# Patient Record
Sex: Male | Born: 1940
Health system: Southern US, Community
[De-identification: ages and names within clinical notes are randomized; demographics above are authoritative.]

## PROBLEM LIST (undated history)

## (undated) DIAGNOSIS — K219 Gastro-esophageal reflux disease without esophagitis: Secondary | ICD-10-CM

## (undated) DIAGNOSIS — Z8719 Personal history of other diseases of the digestive system: Secondary | ICD-10-CM

## (undated) DIAGNOSIS — Z87442 Personal history of urinary calculi: Secondary | ICD-10-CM

## (undated) DIAGNOSIS — N189 Chronic kidney disease, unspecified: Secondary | ICD-10-CM

## (undated) DIAGNOSIS — Z973 Presence of spectacles and contact lenses: Secondary | ICD-10-CM

## (undated) DIAGNOSIS — M1990 Unspecified osteoarthritis, unspecified site: Secondary | ICD-10-CM

## (undated) DIAGNOSIS — R079 Chest pain, unspecified: Secondary | ICD-10-CM

## (undated) DIAGNOSIS — I10 Essential (primary) hypertension: Secondary | ICD-10-CM

## (undated) DIAGNOSIS — C801 Malignant (primary) neoplasm, unspecified: Secondary | ICD-10-CM

## (undated) DIAGNOSIS — J189 Pneumonia, unspecified organism: Secondary | ICD-10-CM

## (undated) HISTORY — PX: COLONOSCOPY: SHX174

## (undated) HISTORY — PX: KNEE ARTHROSCOPY: SHX127

## (undated) HISTORY — DX: Chest pain, unspecified: R07.9

---

## 1974-10-03 HISTORY — PX: APPENDECTOMY: SHX54

## 1974-10-03 HISTORY — PX: CHOLECYSTECTOMY: SHX55

## 1982-10-03 HISTORY — PX: BACK SURGERY: SHX140

## 2000-05-28 ENCOUNTER — Emergency Department (HOSPITAL_COMMUNITY): Admission: EM | Admit: 2000-05-28 | Discharge: 2000-05-28 | Payer: Self-pay | Admitting: Emergency Medicine

## 2000-05-28 ENCOUNTER — Encounter: Payer: Self-pay | Admitting: Emergency Medicine

## 2005-08-01 ENCOUNTER — Encounter: Payer: Self-pay | Admitting: Gastroenterology

## 2005-08-01 DIAGNOSIS — D126 Benign neoplasm of colon, unspecified: Secondary | ICD-10-CM | POA: Insufficient documentation

## 2005-08-01 DIAGNOSIS — K648 Other hemorrhoids: Secondary | ICD-10-CM | POA: Insufficient documentation

## 2007-02-12 ENCOUNTER — Inpatient Hospital Stay (HOSPITAL_COMMUNITY): Admission: RE | Admit: 2007-02-12 | Discharge: 2007-02-21 | Payer: Self-pay | Admitting: Orthopedic Surgery

## 2007-02-22 ENCOUNTER — Ambulatory Visit: Payer: Self-pay | Admitting: Internal Medicine

## 2007-03-21 ENCOUNTER — Ambulatory Visit: Payer: Self-pay | Admitting: Gastroenterology

## 2007-03-21 ENCOUNTER — Ambulatory Visit (HOSPITAL_COMMUNITY): Admission: RE | Admit: 2007-03-21 | Discharge: 2007-03-21 | Payer: Self-pay | Admitting: Gastroenterology

## 2007-04-11 ENCOUNTER — Ambulatory Visit: Payer: Self-pay | Admitting: Gastroenterology

## 2008-01-19 DIAGNOSIS — I10 Essential (primary) hypertension: Secondary | ICD-10-CM

## 2008-01-19 DIAGNOSIS — M129 Arthropathy, unspecified: Secondary | ICD-10-CM | POA: Insufficient documentation

## 2008-01-19 DIAGNOSIS — K560 Paralytic ileus: Secondary | ICD-10-CM

## 2008-01-19 DIAGNOSIS — K3189 Other diseases of stomach and duodenum: Secondary | ICD-10-CM

## 2008-01-19 DIAGNOSIS — R1013 Epigastric pain: Secondary | ICD-10-CM

## 2008-01-19 DIAGNOSIS — N200 Calculus of kidney: Secondary | ICD-10-CM

## 2009-08-07 ENCOUNTER — Emergency Department (HOSPITAL_COMMUNITY): Admission: EM | Admit: 2009-08-07 | Discharge: 2009-08-07 | Payer: Self-pay | Admitting: Family Medicine

## 2010-09-08 ENCOUNTER — Ambulatory Visit (HOSPITAL_COMMUNITY)
Admission: RE | Admit: 2010-09-08 | Discharge: 2010-09-08 | Payer: Self-pay | Source: Home / Self Care | Admitting: Orthopedic Surgery

## 2010-11-04 ENCOUNTER — Institutional Professional Consult (permissible substitution) (INDEPENDENT_AMBULATORY_CARE_PROVIDER_SITE_OTHER): Payer: Medicare Other | Admitting: Cardiovascular Disease

## 2010-11-04 DIAGNOSIS — K219 Gastro-esophageal reflux disease without esophagitis: Secondary | ICD-10-CM

## 2010-11-04 DIAGNOSIS — I119 Hypertensive heart disease without heart failure: Secondary | ICD-10-CM

## 2010-11-04 DIAGNOSIS — Z01810 Encounter for preprocedural cardiovascular examination: Secondary | ICD-10-CM

## 2010-12-22 ENCOUNTER — Other Ambulatory Visit: Payer: Self-pay | Admitting: Orthopedic Surgery

## 2010-12-22 ENCOUNTER — Other Ambulatory Visit (HOSPITAL_COMMUNITY): Payer: Self-pay | Admitting: Orthopedic Surgery

## 2010-12-22 ENCOUNTER — Encounter (HOSPITAL_COMMUNITY): Payer: Medicare Other

## 2010-12-22 ENCOUNTER — Ambulatory Visit (HOSPITAL_COMMUNITY)
Admission: RE | Admit: 2010-12-22 | Discharge: 2010-12-22 | Disposition: A | Discharge status: Home / Self Care | Payer: Medicare Other | Source: Ambulatory Visit | Attending: Orthopedic Surgery | Admitting: Orthopedic Surgery

## 2010-12-22 DIAGNOSIS — Z01818 Encounter for other preprocedural examination: Secondary | ICD-10-CM | POA: Insufficient documentation

## 2010-12-22 DIAGNOSIS — I10 Essential (primary) hypertension: Secondary | ICD-10-CM | POA: Insufficient documentation

## 2010-12-22 DIAGNOSIS — Z01812 Encounter for preprocedural laboratory examination: Secondary | ICD-10-CM | POA: Insufficient documentation

## 2010-12-22 LAB — CBC
MCH: 28.7 pg (ref 26.0–34.0)
MCHC: 33.6 g/dL (ref 30.0–36.0)
MCV: 85.4 fL (ref 78.0–100.0)
Platelets: 223 10*3/uL (ref 150–400)
RBC: 5.29 MIL/uL (ref 4.22–5.81)
RDW: 13.4 % (ref 11.5–15.5)

## 2010-12-22 LAB — COMPREHENSIVE METABOLIC PANEL
Alkaline Phosphatase: 70 U/L (ref 39–117)
BUN: 19 mg/dL (ref 6–23)
Creatinine, Ser: 1.07 mg/dL (ref 0.4–1.5)
Glucose, Bld: 89 mg/dL (ref 70–99)
Potassium: 4.6 mEq/L (ref 3.5–5.1)
Total Bilirubin: 0.9 mg/dL (ref 0.3–1.2)
Total Protein: 7.6 g/dL (ref 6.0–8.3)

## 2010-12-22 LAB — URINALYSIS, ROUTINE W REFLEX MICROSCOPIC
Bilirubin Urine: NEGATIVE
Ketones, ur: NEGATIVE mg/dL
Nitrite: NEGATIVE
Protein, ur: NEGATIVE mg/dL
pH: 6 (ref 5.0–8.0)

## 2010-12-22 LAB — APTT: aPTT: 32 seconds (ref 24–37)

## 2010-12-22 LAB — PROTIME-INR
INR: 0.97 (ref 0.00–1.49)
Prothrombin Time: 13.1 seconds (ref 11.6–15.2)

## 2010-12-29 ENCOUNTER — Inpatient Hospital Stay (HOSPITAL_COMMUNITY)
Admission: RE | Admit: 2010-12-29 | Discharge: 2011-01-01 | DRG: 468 | Disposition: A | Discharge status: Home Health | Payer: Medicare Other | Source: Ambulatory Visit | Attending: Orthopedic Surgery | Admitting: Orthopedic Surgery

## 2010-12-29 DIAGNOSIS — Z7982 Long term (current) use of aspirin: Secondary | ICD-10-CM

## 2010-12-29 DIAGNOSIS — Z791 Long term (current) use of non-steroidal anti-inflammatories (NSAID): Secondary | ICD-10-CM

## 2010-12-29 DIAGNOSIS — Z01818 Encounter for other preprocedural examination: Secondary | ICD-10-CM

## 2010-12-29 DIAGNOSIS — T84039A Mechanical loosening of unspecified internal prosthetic joint, initial encounter: Principal | ICD-10-CM | POA: Diagnosis present

## 2010-12-29 DIAGNOSIS — K219 Gastro-esophageal reflux disease without esophagitis: Secondary | ICD-10-CM | POA: Diagnosis present

## 2010-12-29 DIAGNOSIS — Z01812 Encounter for preprocedural laboratory examination: Secondary | ICD-10-CM

## 2010-12-29 DIAGNOSIS — Z79899 Other long term (current) drug therapy: Secondary | ICD-10-CM

## 2010-12-29 DIAGNOSIS — Y831 Surgical operation with implant of artificial internal device as the cause of abnormal reaction of the patient, or of later complication, without mention of misadventure at the time of the procedure: Secondary | ICD-10-CM | POA: Diagnosis present

## 2010-12-29 DIAGNOSIS — I10 Essential (primary) hypertension: Secondary | ICD-10-CM | POA: Diagnosis present

## 2010-12-29 DIAGNOSIS — Z96659 Presence of unspecified artificial knee joint: Secondary | ICD-10-CM

## 2010-12-29 LAB — GRAM STAIN: Gram Stain: NONE SEEN

## 2010-12-29 LAB — ABO/RH: ABO/RH(D): A POS

## 2010-12-30 LAB — BASIC METABOLIC PANEL
Calcium: 8.4 mg/dL (ref 8.4–10.5)
GFR calc Af Amer: >60 mL/min (ref >60)
GFR calc non Af Amer: >60 mL/min (ref >60)
Sodium: 135 mEq/L (ref 135–145)

## 2010-12-30 LAB — CBC
MCHC: 33.3 g/dL (ref 30.0–36.0)
Platelets: 194 10*3/uL (ref 150–400)
RDW: 13 % (ref 11.5–15.5)
WBC: 8.4 10*3/uL (ref 4.0–10.5)

## 2010-12-31 LAB — BASIC METABOLIC PANEL
CO2: 29 mEq/L (ref 19–32)
Calcium: 8.6 mg/dL (ref 8.4–10.5)
Chloride: 101 mEq/L (ref 96–112)
Creatinine, Ser: 0.94 mg/dL (ref 0.4–1.5)
GFR calc Af Amer: >60 mL/min (ref >60)
Sodium: 135 mEq/L (ref 135–145)

## 2010-12-31 LAB — CBC
Hemoglobin: 11.2 g/dL — ABNORMAL LOW (ref 13.0–17.0)
MCH: 28.5 pg (ref 26.0–34.0)
Platelets: 168 10*3/uL (ref 150–400)
RBC: 3.93 MIL/uL — ABNORMAL LOW (ref 4.22–5.81)

## 2011-01-01 LAB — CBC
Hemoglobin: 11.2 g/dL — ABNORMAL LOW (ref 13.0–17.0)
MCH: 28.5 pg (ref 26.0–34.0)
MCV: 85.2 fL (ref 78.0–100.0)
Platelets: 185 10*3/uL (ref 150–400)
RBC: 3.93 MIL/uL — ABNORMAL LOW (ref 4.22–5.81)
WBC: 10.3 10*3/uL (ref 4.0–10.5)

## 2011-01-01 LAB — BODY FLUID CULTURE: Culture: NO GROWTH

## 2011-01-02 HISTORY — PX: JOINT REPLACEMENT: SHX530

## 2011-01-03 LAB — ANAEROBIC CULTURE

## 2011-01-04 NOTE — Op Note (Signed)
NAME:  Frank Johnston, Frank Johnston            ACCOUNT NO.:  [id number]  MEDICAL RECORD NO.:  [id number]           PATIENT TYPE:  I  LOCATION:  0006                         FACILITY:  [facility]  PHYSICIAN:  Ollen Gross, M.D.    DATE OF BIRTH:  10-Dec-1940  DATE OF PROCEDURE: DATE OF DISCHARGE:                              OPERATIVE REPORT   PREOPERATIVE DIAGNOSIS:  Failed left total knee arthroplasty.  POSTOPERATIVE DIAGNOSIS:  Failed left total knee arthroplasty.  PROCEDURE:  Left total knee arthroplasty revision.  SURGEON:  Ollen Gross, MD  ASSISTANT:  Alexzandrew L. Perkins, PAC  ANESTHESIA:  General.  ESTIMATED BLOOD LOSS:  Minimal.  DRAINS:  Hemovac x1.  TOURNIQUET TIME:  Up 41 minutes at 300 mmHg, down 8 minutes, and then up additional 22 minutes at 300 mmHg.  COMPLICATIONS:  None.  CONDITION:  Stable to Recovery.  BRIEF CLINICAL NOTE:  Frank Johnston is a 70 year old male, had a left total knee arthroplasty done approximately 4 years ago and has had persistent pain and stiffness in the knee.  Workup for infection was negative.  He has range of motion approximately 10 to 70 or 75.  Bone scan showed loosening of the components.  He presents now for scar excision, left total knee arthroplasty revision.  PROCEDURE IN DETAIL:  After successful administration of general anesthetic, a tourniquet is placed on his left thigh and his left lower extremity was prepped and draped in the usual sterile fashion. Extremities wrapped in Esmarch, tourniquet inflated to 300 mmHg. Midline incision was made with 10 blade through the subcutaneous tissue to the level of the extensor mechanism.  A fresh blade is used to make a medial parapatellar arthrotomy.  There was minimal fluid encountered in the joint.  That which is encountered is sent for stat Gram stain and C and S which came back no organisms and no white cells.  The soft tissue on the proximal medial tibia is then subperiosteally  elevated around the joint line to the semimembranosus bursa.  The scar is then excised from under the extensor mechanism, both medial and lateral.  Was able to evert the patella and then flex to 90 degrees.  I removed the tibial polyethylene.  The tissue looked normal with no signs of infection. There is hypertrophic scar present which we did debride.  I removed the femoral component by disrupting interface between the component and bone utilizing osteotomes.  There is minimal bone loss with the removal.  We then subluxed tibia forward, placed retractors, and then disrupted interface between tibial component bone.  Of note, there was a tremendous amount of hypertrophic bone in the lateral gutter.  This was coming off the tibia and intertwined with the infrapatellar fat pad.  This was definitely limiting some of the motion. When I excised this, a really freed everything and is better.  I was then able to easily remove the tibial component which had loosened.  We then removed the cement.  The canals were then entered with the canal finder and I thoroughly irrigated to remove the fatty contents.  I reamed on the tibial side up to 13 mm and  on the femoral side up to 20 mm.  The extramedullary tibial cutting guide is placed referencing proximally at the medial aspect of tibial tubercle and distally along the second metatarsal axis tibial crest.  Block is pinned to remove about 2 mm off the cut bone surface.  This is performed with an oscillating saw.  The size 4 is the most appropriate tibial component.  I prepared the proximal tibia with a modular drill and then the keel punch for the size 4.  We had excellent fit with that.  I also prepared for the sleeve and placed a 29 sleeve which had excellent torsional stability.  We then placed a 20 mm reamer into the femoral canal and used that as our cutting guide.  The 5 degrees left valgus alignment guide is placed. We removed minimal bone off  the femoral condyles distally.  I chose to remove more bone and used 4-mm augments distally both medial and lateral.  The size 4 is the most appropriate on the femoral side.  The AP cutting block was placed in the +2 position to effectively raise the stem and lower the flange of prosthesis down to the femur.  The rotation is marked by creating rectangular flexion gap at 90 degrees using the spacer block.  The anterior-posterior cuts are made.  A 54-mm augments both medial and lateral posterior.  This block was removed and the intercondylar blocks placed, and chamfer cuts in the intercondylar crest of TC3.  The cuts were made.  A trial femur was then configured which was a size 4 TC3 with a 20 x 75 stem in a +2 position and the 4 mm distal augments medial and lateral and 4 mm augments posteriorly medial and lateral.  We had great fit with this trial.  On the tibial side, we placed a trial then which was a 4 MBT with a 13 x 30 stem extension and 29 sleeve.  Sleeve was placed in the neutral position.  Also had excellent fit with this prosthesis.  I placed a 12.5 mm insert.  Full extension was achieved with excellent varus-valgus and anterior- posterior balance throughout full range of motion.  The patella was inspected.  I did a patelloplasty removing the soft tissue from the articular surface.  There is a lot of hypertrophic bone overgrowing the patellar component.  I removed that.  There was no overhanging bone that was done.  The patella tracks normally through a range of motion.  We then held in extension and with tourniquet down for initial tourniquet time of 41 minutes.  It was held down for 8 minutes while the components were assembled on the back table.  Components assembled of the same sizes with the trials were utilized.  After 8 minutes, the components were all configured, then the leg was rewrapped in Esmarch and tourniquet reinflated to 300 mmHg.  Wounds copiously irrigated  with saline solution and then the cement restrictor trial for the tibia is placed and it is a size 5.  The real restrictor was then placed in the appropriate depth in the tibial canal.  We then mixed the cement which is 3 batches of gentamicin impregnated cement.  Once ready for implantation, it is injected in the tibial canal and on the tibial cut bone surface and tibial components impacted and all extruded cement removed.  On the femoral side, we cemented distally, that has a Product/process development scientist.  The femoral component then impacted and extruded cement removed.  A  12.5 mm insert trial was placed and full extension was achieved with excellent varus-valgus, anterior-posterior balance throughout full range of motion.  When the cement is fully hardened, then a permanent 12.5 mm TC3 rotating platform insert is placed into the tibial tray.  The wounds were copiously irrigated with saline solution and the arthrotomy closed over Hemovac drain with interrupted #1 PDS. Flexion against gravity to 125 degrees, which was marked improvement from preop.  Tourniquet released, second tourniquet time of 22 minutes.  Subcu was closed with interrupted 2-0 Vicryl and skin closed with staples.  Catheter for Marcaine pain pump was placed and pumps initiated.  Incisions cleaned and dried and a bulky sterile dressing applied.  We then placed into a knee immobilizer, awakened and transported to Recovery in stable condition.     Ollen Gross, M.D.     FA/MEDQ  D:  12/29/2010  T:  12/29/2010  Job:  [id number]  Electronically Signed by Ollen Gross M.D. on 01/04/2011 07:14:19 AM

## 2011-01-04 NOTE — H&P (Signed)
NAME:  Frank Johnston, SOULES NO.:  [id number]  MEDICAL RECORD NO.:  [id number]           PATIENT TYPE:  LOCATION:                                 FACILITY:  PHYSICIAN:  Ollen Gross, M.D.    DATE OF BIRTH:  [birth year]  DATE OF ADMISSION:  12/29/2010 DATE OF DISCHARGE:                             HISTORY & PHYSICAL   CHIEF COMPLAINT:  Left knee pain.  HISTORY OF PRESENT ILLNESS:  The patient is a 70 year old male who has been seen by Dr. Lequita Halt for ongoing left knee pain and stiffness.  He was seen as a second opinion this past fall for ongoing problems with his knee.  He has been treated by Dr. Gean Birchwood in the past and has had a history of progressive issues with knee.  He originally had a total knee arthroplasty performed in May 2008 and did not have any initial problems.  Postoperatively, a couple of weeks after surgery, he developed a stitch abscess, it was opened up and swabbed.  He was on antibiotics for 7-10 days and felt things were healing back up.  He did fairly well until about a year and a half ago when he started to lose motion and had increasing pain.  He was told that he may have to have the part of the knee or the whole knee redone.  He was seen by Dr. Lequita Halt as a second opinion.  He was sent for a bone scan, which had been reviewed and found to be "hot" in all 3 phases and around all 3 components.  There is a high probability including differential is a loose prosthesis and from all of these, stiffening has developed.  It is felt he would best be served by undergoing revision versus resection due to the issues with his total knee.  Risks and benefits have been discussed with the patient and he elected to proceed with surgery.  He has been seen preoperatively by Dr. Jarome Matin, felt to be stable for surgery.  He has also been seen by Dr. Kristeen Miss and felt to be at low risk for his upcoming knee procedure.  ALLERGIES:  No known drug  allergies.  CURRENT MEDICATIONS: 1. Diovan HCT 320/12.5 mg daily. 2. Verapamil SR 240 mg daily. 3. Omeprazole 40 mg daily. 4. Metoclopramide 5 mg daily. 5. Aspirin 81 mg daily. 6. Vitamin E 400 international units soft gel daily. 7. Ultracet 37.5/325 mg 3 times a day as needed. 8. Fluticasone nasal spray 2 sprays each nostril daily as needed. 9. Multivitamin.  PAST MEDICAL HISTORY: 1. Tinnitus. 2. Early cataracts. 3. Hypertension. 4. Gastroesophageal reflux disease. 5. Hiatal hernia. 6. Hemorrhoids. 7. History of postoperative ileus, following his previous total knee     replacement. 8. History of renal calculi x1. 9. Childhood illnesses of measles and mumps.  PAST SURGICAL HISTORY:  Gallbladder surgery in 1976, back surgery in 1984, right knee arthroscopy in 2007, left knee arthroscopy in 2008, left total knee replacement in May 2008.  FAMILY HISTORY:  Father deceased at age 37 with lung cancer.  Mother deceased at age 82 with Alzheimer's and stroke.  SOCIAL HISTORY:  Married.  Retired.  Past smoker.  No alcohol.  He does have a caregiver lined up following his surgery.  He lives in a 2-story home with 2 steps entering in the house.  He does have a living will.  REVIEW OF SYSTEMS:  GENERAL:  No fever, chills, or night sweats.  NEURO: A little bit of ringing in his ears.  No seizure or syncope. RESPIRATORY:  No shortness of breath, productive cough, or hemoptysis. CARDIOVASCULAR:  No chest pain, angina, or orthopnea.  GI:  No nausea, vomiting, diarrhea, or constipation.  GU:  No dysuria, hematuria, or discharge.  MUSCULOSKELETAL:  Left knee.  PHYSICAL EXAMINATION:  VITAL SIGNS:  Pulse 56, respirations 12, blood pressure 146/78. GENERAL:  A 70 year old white male, well nourished, well developed, in no acute distress.  He is alert, oriented, cooperative.  Good historian. HEENT:  Normocephalic, atraumatic.  Pupils are round and reacting.  EOMs intact. NECK:   Supple. CHEST:  Clear. HEART:  Regular rate and rhythm without murmur.  S1, S2 noted. ABDOMEN:  Soft, nontender.  Bowel sounds present. RECTAL/BREASTS/GENITALIA:  Not done, not pertinent to present illness. EXTREMITIES:  Left knee, no effusion, range of motion 5 to only 85 degrees of flexion.  There is no obvious instability.  IMPRESSION:  Probable loosening of left total knee.  PLAN:  The patient admitted to [facility] to undergo a revision of the left total knee arthroplasty versus possible resection. Risks and benefits have been discussed and they elected to proceed with surgery.  Dr. Jarome Matin requested that we contact him on the date of surgery, so we will notify Dr. Eloise Harman about his surgery.  His cardiologist is Dr. Elease Hashimoto.  He is felt to be low risk for his upcoming procedure from a cardiac standpoint.  We will call Dr. Elease Hashimoto as needed.     Alexzandrew L. Julien Girt, P.A.C.   ______________________________ Ollen Gross, M.D.    ALP/MEDQ  D:  12/26/2010  T:  12/27/2010  Job:  [id number]  cc:   Barry Dienes. Eloise Harman, M.D. Fax: [phone]  Vesta Mixer, M.D. Fax: [phone]  Griffith Citron, M.D. Fax: [phone]  Kristine Garbe. Ezzard Standing, M.D. Fax: [phone]  Delon Sacramento, M.D. Fax: [phone]  Rachael Fee, MD [address] [place], [place] [address]  Rocco Serene, M.D. Fax: [phone]  Electronically Signed by Patrica Duel P.A.C. on 12/30/2010 10:40:16 AM Electronically Signed by Ollen Gross M.D. on 01/04/2011 07:14:17 AM

## 2011-01-31 NOTE — Discharge Summary (Signed)
NAME:  [name], Johnston NO.:  [id number]  MEDICAL RECORD NO.:  [id number]           PATIENT TYPE:  O  LOCATION:  XRAY                         FACILITY:  [facility]  PHYSICIAN:  Ollen Gross, M.D.    DATE OF BIRTH:  1941-10-02  DATE OF ADMISSION:  12/29/2010 DATE OF DISCHARGE:  01/01/2011                              DISCHARGE SUMMARY   ADMITTING DIAGNOSES: 1. Loosening of left total knee. 2. Tinnitus. 3. Cataracts. 4. Hypertension. 5. Reflux disease. 6. Hiatal hernia. 7. Hemorrhoids. 8. Previous history of postop ileus following previous surgery. 9. History of renal calculi. 10.Arthritis. 11.Childhood illnesses measles, mumps.  DISCHARGE DIAGNOSES: 1. Failed left total knee arthroplasty, status post revision left     total knee arthroplasty. 2. Tinnitus. 3. Cataracts. 4. Hypertension. 5. Reflux disease. 6. Hiatal hernia. 7. Hemorrhoids. 8. Previous history of postop ileus following previous surgery. 9. History of renal calculi.10.Arthritis. 11.Childhood illnesses measles, mumps.  PROCEDURE:  December 29, 2010, left total knee revision.  SURGEON:  Ollen Gross, M.D.  ASSISTANT:  Avel Peace PA-C.  ANESTHESIA:  General.  CONSULTS:  None.  BRIEF HISTORY:  Patient is a 70 year old male with a left total knee done approximately 4 years ago and a persistent pain and stiffness workup for infection has been negative, shows loosening on a scan and felt benefit from undergoing revision.  LABORATORY DATA:  Preop CBC was not scanned into the chart, but the follow-up CBC showed hemoglobin of 12.7 and 11.2.  Last H and H stabilized 11.2 and 33.5.  The Chem panel on admission was not scanned into the chart, but the follow-up BMET showed electrolytes all were within normal limits.  Blood group type A+.  Fluids taken at the time of surgery showed no growth and no organisms on the smear.  Stat Gram stain showed no organisms.  Anaerobic culture showed no  organisms on the smear.  No anaerobes were isolated.  HOSPITAL COURSE:  Patient was admitted to the [facility] and was taken to OR, underwent above-stated procedure without complication. The patient tolerated the procedure well, later was transferred to the recovery room of orthopedic floor.  Had a rough night with pain, do a little better on the morning of day one.  Had decent urinary output, started back on his home meds.  He had a previous history of a postop ileus with his previous surgery years ago, so we started him on metoclopramide IV prophylactically to try to prevent any postop ileus. Again, he was quite concerned about this.  By day two, he is doing better.  He had already been up with therapy.  Dressing changes. Incision looked good.  He was tolerating his meds.  He continued to progress well by day three.  He was doing well, progressing with therapy and discharged home.  DISCHARGE/PLAN: 1. Patient was discharged home on January 01, 2011. 2. Discharge diagnoses please see above. 3. Discharge medications: 4. Robaxin. 5. Percocet. 6. Xarelto. 7. Continue aspirin, Diovan, HCTZ, fluticasone, metoclopramide,     omeprazole and verapamil.  DIET:  Heart-healthy diet.  ACTIVITY:  Weightbearing as tolerated, total hip protocol.  FOLLOWUP:  Follow-up in 2 weeks.  DISPOSITION:  Home.  CONDITION ON DISCHARGE:  Improving.     Alexzandrew L. Julien Girt, P.A.C.   ______________________________ Ollen Gross, M.D.    ALP/MEDQ  D:  01/20/2011  T:  01/20/2011  Job:  [id number]  cc:   Barry Dienes. Eloise Harman, M.D. Fax: [phone]  Vesta Mixer, M.D. Fax: [phone]  Electronically Signed by Patrica Duel P.A.C. on 01/24/2011 07:57:23 AM Electronically Signed by Ollen Gross M.D. on 01/31/2011 07:09:38 AM

## 2011-02-15 NOTE — Assessment & Plan Note (Signed)
[facility]                         GASTROENTEROLOGY OFFICE NOTE   Frank Johnston, Frank Johnston                   MRN:          [id number]  DATE:03/21/2007                            DOB:          08-12-1941    GI FOLLOWUP NOTE:   PRIMARY CARE PHYSICIAN:  Barry Dienes. Eloise Harman, M.D.   GI PROBLEM LIST:  1. Postoperative colonic ileus.  In May, 2008, left knee surgery,      shortly postoperative colonic ileus that resolved over 3-4 days      with NG tube decompression, bowel rest, and limitation of his      narcotic pain medicines.   INTERVAL HISTORY:  I last saw Frank Johnston at the time of his  hospitalization about a month ago.  He had a postoperative colonic ileus  that resolved with NG tube decompression, time, and limiting his  narcotic pain medicines.  Of note, he had a colonoscopy by Dr. Ritta Slot in October, 2006, and he tells me that was essentially normal.  Since he went home from the hospital, he says his abdomen has just not  felt right.  He has had no nausea or vomiting but has had some  discomfort in his abdomen, more of a bloated sensation.  His discomforts  are definitely improved when he passes wind, has a bowel movement, or  belches.  He has had no fevers or chills.  He is still bothered by a  left knee incision infection.  He has been  on antibiotics for it.   CURRENT MEDICATIONS:  Verapamil, Diovan, Protonix, nabumetone, aspirin,  vitamin E, multivitamin, Reglan 5 mg t.i.d. (recently started by his  PCP), cephalexin for infection in his left knee incision.   PHYSICAL EXAMINATION:  VITAL SIGNS:  Height 5 feet 8 inches, 193 pounds.  Blood pressure 122/68, pulse 72.  CONSTITUTIONAL:  Generally well-appearing.  LUNGS:  Clear to auscultation bilaterally.  CARDIOVASCULAR:  Heart has a regular rate and rhythm.  ABDOMEN:  Soft.  Very mildly tender throughout.  No focal tenderness.  No peritoneal signs.  Normal bowel sounds.   Nondistended.   ASSESSMENT/PLAN:  A 70 year old man with recent postoperative ileus, now  with mild abdominal discomfort, bloating, that improved with bowel  movement and passing gas.   I do not think he still has an ileus or a bowel obstruction but to be  certain, I will arrange for him to have obstructive series performed.  I  have recommended that he begin taking two Gas-X with every meal as well  as an antispasmodic twice daily.  He is on twice daily NSAIDs and  nabumetone.  This can cause all sorts of dyspeptic reactions, so I have  recommended that he decrease this as much as possible using Extra  Strength Tylenol as his first line for pain medicine.  The Reglan was a  very good idea, but I favored these other methods first, so I will have  him hold the Reglan for now. If these therapies are not helpful, then we  will have to  consider promotility agent like Reglan or further investigations such as  upper  endoscopy or other.  He will return to see me in 2-3 weeks and  sooner if needed.     Rachael Fee, MD  Electronically Signed    DPJ/MedQ  DD: 03/21/2007  DT: 03/22/2007  Job #: [id number]   cc:   Barry Dienes. Eloise Harman, M.D.

## 2011-02-15 NOTE — Assessment & Plan Note (Signed)
[facility]                         GASTROENTEROLOGY OFFICE NOTE   MUKESH, KORNEGAY                   MRN:          [id number]  DATE:04/11/2007                            DOB:          [birth year]    PRIMARY CARE PHYSICIAN:  Barry Dienes. Eloise Harman, M.D.   GI PROBLEM LIST:  1. Postoperative colonic ileus.  In May, 2008, left knee surgery,      shortly postoperative colonic ileus that resolved over 3-4 days      with NG tube decompression, bowel rest, and limitation of his      narcotic pain medicines.   INTERVAL HISTORY:  I last saw Mr. Ledford 2-3 weeks ago. He had been  having some mild abdominal discomfort, bloating that improved with bowel  movements and passing gas.  Clinically, he did not have any signs of  recurrent ileus. He did get an obstructive series following his visit  here and that was normal. I have recommended that he consider cutting  back on the nabumetone as it can cause a lot of dyspepsia. He did do  that on my advice and also the advice of his primary care physician as  his kidney numbers were slightly elevated and since then he has felt  much better. He moves his bowels once a day, non-bloody. Has no nausea  or vomiting.   CURRENT MEDICATIONS:  1. Verapamil.  2. Diovan.  3. Protonix.  4. Aspirin.  5. Vitamin E.  6. Multivitamin.   PHYSICAL EXAMINATION:  Weight 194 pounds, blood pressure 122/70, pulse  88.  ABDOMEN: Soft and nontender. Nondistended. Normal bowel sounds.   ASSESSMENT/PLAN:  A 70 year old man with resolved postoperative ileus,  improved dyspeptic symptoms since stopping Relafen.   He is much better since stopping the Relafen. I think that probably was  contributing to his dyspeptic symptoms. He has no signs of recurrent  ileus. I have recommended that he be careful with any further narcotics  as that was probably a big reason why he had the postoperative ileus. He  had a colonoscopy with Dr.  Ritta Slot in 2006 and was told there were  several polyps and his next colonoscopy would be in 2009. We will get  the records sent over from Dr.  Jennye Boroughs  office (Dr.  Kinnie Scales does not accept this patient's insurance anymore)  and will arrange for surveillance colonoscopy at the proper interval.     Rachael Fee, MD  Electronically Signed    DPJ/MedQ  DD: 04/11/2007  DT: 04/11/2007  Job #: [id number]   cc:   Barry Dienes. Eloise Harman, M.D.

## 2011-02-18 NOTE — Discharge Summary (Signed)
NAME:  Frank Johnston, Frank Johnston NO.:  [id number]   MEDICAL RECORD NO.:  [id number]          PATIENT TYPE:  INP   LOCATION:  5038                         FACILITY:  [facility]   PHYSICIAN:  Feliberto Gottron. Turner Daniels, M.D.   DATE OF BIRTH:  26-Dec-1940   DATE OF ADMISSION:  02/12/2007  DATE OF DISCHARGE:  02/21/2007                               DISCHARGE SUMMARY   DIAGNOSIS FOR THIS ADMISSION:  End stage degenerative joint disease of  the left knee.   PROCEDURE WHILE IN THE HOSPITAL:  Left total knee arthroplasty.   DISCHARGE SUMMARY:  The patient is a 70 year old man followed for a long  period of time for progressive arthritis of the left knee who has failed  conservative treatment with anti-inflammatory medications, physical  therapy, cortisone injections, arthroscopic debridement and most  recently Synvisc injections.  These have all worked temporarily but now  have ultimately failed.  He has end stage arthritis with bone on bone  arthritic changes by x-ray and he desires elective left total knee  arthroplasty after discussing risks and benefits of surgery.  Questions  were answered and he wishes to proceed.   ALLERGIES:  No known drug allergies.   MEDICATION AT TIME OF ADMISSION:  Verapamil, Diovan, aspirin, Protonix,  vitamin E and multivitamins.  He had stopped his nabumetone prior to his  admission.   PAST MEDICAL HISTORY:  Significant for childhood diseases, adult history  of hypertension, kidney stones and DJD.   PAST SURGICAL HISTORY:  Gallbladder, lumbar surgery and left knee scope  in 2008.  No difficulties with DVT.   SOCIAL HISTORY:  No tobacco, no ethanol, no illicit drug abuse.  He is  married and works as a Chiropractor.   FAMILY HISTORY:  Father died at age 73 from lung cancer.  Mother died at  age 72 with positive for hypertension.   REVIEW OF SYSTEMS:  Positive for glasses and hemorrhoids.  Negative for  any recent illness, shortness of  breath or chest pain.   PHYSICAL EXAMINATION:  VITAL SIGNS:  The patient's temperature is 98.3,  pulse 78, blood pressure 140/85.  He is a 5'9, 207 pound male.  HEAD:  Normocephalic, atraumatic.  EARS:  Tympanic membranes are clear.  EYES:  Pupils equal, round, reactive to light and accommodation.  NOSE:  Patent.  THROAT:  Benign.  NECK:  Supple.  Full range of motion.  CHEST:  Clear to auscultation and percussion.  HEART:  Regular rate and rhythm.  ABDOMEN:  Soft and nontender.  Bowel sounds 2+.  EXTREMITIES:  Left knee range of motion 5 to 100 degrees, positive  crepitus, stable ligamentously with some numbness over the lateral  aspect of the left knee.  SKIN:  Within normal limits.   X-rays showed bone on bone changes throughout the left knee.   PREOPERATIVE LABS:  Including CBC, CMT,  chest x-ray,  EKG, PT and PTT  were within normal limits with the exception of sinus tachycardia with  SVTs.  Potassium of 3.4, which is minimally abnormal.   HOSPITAL COURSE:  On the day of admission the  patient was taken to the  operating room at [facility] where he underwent a left total knee  arthroplasty with all components all cemented, #4 left femur, #5 tibial  base plate 10 mm segment RP spacer and a 38 mm patellar button.  Medium  Hemovac was placed up and into the drain.  Foley catheter was placed  perioperatively.  The patient was placed perioperative antibiotics.  He  was placed on postoperative Coumadin prophylaxis with target INR of 1.5  to 2, per pharmacy protocol.  Bridging Lovenox to cover during the  interim.  The patient was begun with physical therapy in the PACU using  CPM and ambulation was begun first postoperative day.  The patient was  placed on PCA Dilaudid pain pump for pain control.  Postoperative day 1  the patient was awake and alert.  Pain was well controlled.  He was  complaining of some minimal nausea but had already begun passing gas.  Vital signs were  stable.  He was afebrile.  Wound was clean and dry.  Hemoglobin 14.2, INR 1.1.  He was neurovascularly intact.  Urine output  650.  Drain had slowed and was no longer active and was discontinued.  Postoperative day 2, the patient was complaining of moderate pain.  No  nausea or vomiting.  He had some difficulty voiding after removal of  Foley catheter.  T-max 99.5.  Hemoglobin 13.3, INR 1.2.  Dressing was  dry.  Calf was soft.  PCA was discontinued.  He continued with physical  therapy and he was making steady progress.  Postoperative day 3, the  patient was complaining of his belly feeling distended with positive  nausea.  He was afebrile.  Vital signs were stable.  Hemoglobin 12.7,  INR 1.3.  Abdomen was distended with positive bowel sounds.  Wound was  clean and dry.  He was, otherwise, orthopedically stable but because of  his abdominal distention and nausea, he was held for discharge.  He went  on to develop chest pain in addition to the nausea and vomiting and a  medicine consult was obtained.  It was felt the patient was likely  suffering a fecal impaction secondary to ileus.  They also drew  laboratory work to make sure there was no cardiac incident as well.  He  was placed on an NG tube and given Zofran and Reglan as well as an  enema.  Postoperative day 3, the patient was complaining of minimal pain  in the right knee, tolerating the NG tube fairly well, still complaining  of nausea.  He was afebrile.  Hemoglobin 13.4, INR 1.7.  He was  otherwise, orthopedically stable.  He continued to be treated for the  ileus.  Continue with the CPM and PT as tolerated.  The medical consult  continued but GI consult was also obtained because of the continued  difficulties with the ileus.  Because of the patient's condition, they  also placed a rectal tube in addition to the NG tube and did additional  x-ray images as well as laboratory work up.  Postoperative day 4, the  patient was awake and  alert.  Decreased nausea and vomiting.  NG and  rectal tubes remained in place.  Hemoglobin 12.1, INR 1.1.  Continued to  slowly resolve the ileus with significant rectal output and improving GI  output, overall.  On postoperative day 6, the patient was reporting  significant decrease in his discomfort abdominally.  T-max 99.3,  hemoglobin 11.6, INR  1.4.  Dressing was dry, wounds benign.  He remained  orthopedically stable and continued to improve with his ileus.  Postoperative day 7, the patient was reporting an improvement overall.  T-max 99, hemoglobin 11.5, INR 1.4.  Orthopedically unchanged.  Continued treatment for his ileus.  Postoperative day 8, he remained  orthopedically stable.  Hemoglobin 11.9.  Rectal tube was discontinued  and he had positive flatus and stools and on that day his NG tube was  discontinued.  Postoperative day 9, the patient was without complaint.  He was eating well and had positive bowel movement, positive gas and  reported no additional abdominal discomfort.  He was afebrile.  Vital  signs were stable.  INR 1.3, PT 16.7.  Left knee wound was benign.  No  signs of infection.  He had some minimal rash along his buttocks with  skin break down but no other obvious difficulties.  He was stable  orthopedically and was discharged home to the care of his family after  skin consult.   DIET:  As per his GI doctors.   MEDICATIONS:  Restart Coumadin, Darvocet and Ultram, using Darvocet and  Ultram in a very limited fashion.  He will restart his home medications  of Diovan, hydrochlorothiazide, verapamil, Protonix, vitamin E and  multivitamin.   Weight bearing as tolerated with his walker.  Return to clinic in  approximately 1 week with Dr. Turner Daniels.  The patient was orthopedically  stable and medically improved at the time of his discharge.   FINAL DIAGNOSES:  1. Left knee end stage degenerative joint disease.  2. Postoperative ileus.  3. History of coronary artery  disease.  4. History of gastroesophageal reflux disease.  5. History of kidney stones.   PROCEDURE WHILE IN HOSPITAL:  Left total knee arthroplasty.      Laural Benes. Jannet Mantis.      Feliberto Gottron. Turner Daniels, M.D.  Electronically Signed    JBR/MEDQ  D:  04/27/2007  T:  04/27/2007  Job:  [id number]

## 2011-02-18 NOTE — Consult Note (Signed)
NAME:  Frank Johnston, Frank Johnston NO.:  [id number]   MEDICAL RECORD NO.:  [id number]          PATIENT TYPE:  INP   LOCATION:  5038                         FACILITY:  [facility]   PHYSICIAN:  Barry Dienes. Eloise Harman, M.D.DATE OF BIRTH:  10/10/1940   DATE OF CONSULTATION:  02/15/2007  DATE OF DISCHARGE:                                 CONSULTATION   INDICATION FOR CONSULTATION:  Nausea, vomiting, and chest pain.   HISTORY OF PRESENT ILLNESS:  The patient is a 70 year old white male who  is well known to me.  He is status post a Feb 12, 2007, left total knee  replacement which was uneventful.  Since the operation, he has had low-  grade persistent nausea.  He was started on Coumadin on Feb 14, 2007.  Today he had increased nausea with abdominal distention and vomiting  associated with epigastric discomfort and minimal low substernal burning  pain worse with burping.  He has no history of coronary artery disease  but has a history of gastroesophageal reflux disease.   PAST MEDICAL HISTORY:  Hypertension, 2001 nephrolithiasis,  hyperlipidemia, BEE STING HYPERSENSITIVITY, seasonal allergic rhinitis,  atypical chest pain with a September 2005 Cardiolite exercise test  showing a left ventricular ejection fraction of 77% with no signs of  ischemia or infarction.  In March 2008 he had community-acquired  pneumonia with left basilar infiltrate treated with antibiotics.  He had  epigastric pain in March 2008 that resolved with proton pump inhibitor  treatment.  At that time an H. pylorus serology was negative as well as  serum lipase and amylase.  He has a history of colon polyps and status  post colonoscopy in October 2006.   MEDICATIONS PRIOR TO HOSPITAL ADMISSION:  1. Lovastatin 40 mg daily was stopped for unclear reasons.  2. Calan SR 240 mg daily.  3. Diovan/HCT 300/12.5 one tablet daily.  4. Protonix 40 mg daily.  5. Relafen 750 mg twice daily.  6. Omeprazole 20 mg daily was used as a  substitute for Protonix.  7. Aspirin 81 mg daily.   CURRENT MEDICATIONS:  1. Coumadin per pharmacist protocol.  2. Lovenox 30 mg subcutaneous every 12 hours.  3. Colace 100 mg twice daily.  4. Diovan 320 mg daily.  5. Hydrochlorothiazide 12.5 mg daily.  6. Calan SR 240 mg daily.  7. Protonix 40 mg p.o. daily.  8. Vitamin E 400 mg daily.  9. Multivitamin 1 tablet daily.  10.Senna S 1 tablet daily p.r.n. constipation.  11.Percocet 5/325 one or two tablets every 4 hours p.r.n. pain.  12.Reglan 10 mg p.o. q.8 h. p.r.n. nausea.  13.Robaxin 500 mg p.o. q.6 h. p.r.n. muscle spasms.  14.Ambien 5 mg p.o. at bedtime p.r.n. sleep.  15.Relafen 750 mg p.o. b.i.d. p.r.n. pain.  16.Milk of magnesia 30 mL p.o. daily p.r.n. constipation.  17.Compazine 5 to 10 mg IV every 6 hours p.r.n. nausea.   ALLERGIES:  BIAXIN HAS BEEN ASSOCIATED WITH DIFFUSE ACHINESS.   PAST SURGICAL HISTORY:  Cholecystectomy, lumbar spine operation, right  hand cysts, August 2007 right knee arthroscopy, Feb 12, 2007, left total  knee replacement.  FAMILY HISTORY:  Father died at age 21 of lung cancer.  There is no  family history of close relatives with premature coronary artery  disease, colon cancer, or diabetes mellitus.   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  He has a remote history 15 pack years of smoking  stopped in 1990 and no history of tobacco abuse.  He has two children.   REVIEW OF SYSTEMS:  Significant for constipation over the past few days.  He denies fever, chills, palpitations, dysuria, BPH symptoms, anxiety,  or depression.  He was somewhat dizzy with changing from a supine  position to sitting position.   INITIAL PHYSICAL EXAMINATION:  VITAL SIGNS:  Blood pressure 114/62,  pulse 98, respirations 18, temperature 99.6, pulse oxygen saturation 94%  on room air.  GENERAL:  He is a mildly overweight white male who is in no apparent  distress  while lying partially upright in bed.  HEENT:  Exam was within normal limits.  NECK:  Was without jugular venous distention or carotid bruit.  CHEST:  Was clear to auscultation.  HEART:  Had a regular rate and rhythm without significant murmur,  gallop, or rub.  ABDOMEN:  Had moderate diffuse distention with tympany throughout.  There was markedly decreased bowel sounds and no tenderness to  palpation.  EXTREMITIES:  Had 1+ left lower extremity edema with no evidence of  palpable venous cord proximal to the total knee replacement.  NEUROLOGICAL:  Exam was nonfocal.   LABORATORY STUDIES:  White blood cell count 12.9, hemoglobin 12,  hematocrit 37, platelets 198, CPK 114, troponin I 0.05.   EKG SHOWED THE FOLLOWING:  1. Sinus tachycardia (104) with occasional PACs.  2. Small Q-waves in aVF not clearly seen on preoperative EKG.   IMPRESSION AND PLAN:  1. Nausea and vomiting:  This is most likely secondary to high fecal      impaction with secondary ileus.  I doubt that his abdominal      distention is due to occult infection or atypical angina.  I plan      to have a nasogastric tube placed to low intermittent suction.  He      will be given Zofran 4 mg IV now and every 12 hours for 24 hours.      We will continue Reglan at 5 mg IV every 8 hours p.r.n. nausea.  He      will be given a soapsuds enema after he receives the Zofran and the      nasogastric tube.  2. Chest pain:  Substernal and mild and atypical for coronary artery      disease and most likely secondary to gastroesophageal reflux      disease.  We will change Protonix to IV 40 mg daily and check      serial cardiac isoenzymes to rule out the unlikely possibility of      unstable angina or a non-Q-wave myocardial infarction.           ______________________________  Barry Dienes Eloise Harman, M.D.     DGP/MEDQ  D:  02/15/2007  T:  02/15/2007  Job:  [id number]  cc:   Griffith Citron, M.D.  Maretta Bees. Vonita Moss, M.D.  Vesta Mixer, M.D.

## 2011-02-18 NOTE — Op Note (Signed)
NAME:  Frank Johnston, Frank Johnston NO.:  [id number]   MEDICAL RECORD NO.:  [id number]          PATIENT TYPE:  INP   LOCATION:  2899                         FACILITY:  [facility]   PHYSICIAN:  Feliberto Gottron. Turner Daniels, M.D.   DATE OF BIRTH:  [birth year]   DATE OF PROCEDURE:  02/12/2007  DATE OF DISCHARGE:                               OPERATIVE REPORT   PREOPERATIVE DIAGNOSIS:  End-stage arthritis, left knee.   POSTOPERATIVE DIAGNOSIS:  End-stage arthritis, left knee.   PROCEDURE PERFORMED:  Left total knee arthroplasty using DePuy Sigma RP  components, all cemented, 4 left femur, 5 tibial baseplate, 10-mm Sigma  RP spacer and a 38 mm patellar button.   SURGEON:  Feliberto Gottron. Turner Daniels, M.D.   FIRST ASSISTANT:  Skip Mayer PA-C.   ANESTHETIC:  General endotracheal.   ESTIMATED BLOOD LOSS:  Minimal.   FLUID REPLACEMENT:  One liter crystalloid.   DRAINS PLACED:  Foley catheter and two medium Hemovacs.   TOURNIQUET TIME:  1 hour and 20 minutes and we did use FloSeal at the  end for hemostasis.   INDICATIONS FOR PROCEDURE:  This is a 70 year old man followed for a  long period of time for progressive arthritis of the left knee who has  failed conservative treatment with anti-inflammatory medicines, physical  therapy, cortisone injections, arthroscopic debridement and most  recently Synvisc injections.  These all worked temporarily, but now they  have failed.  He has end-stage arthritis with bone-on-bone arthritic  changes by x-ray and he desires elective left total knee arthroplasty.  Risks and benefits understood by the patient.  Questions answered.   DESCRIPTION OF PROCEDURE:  With the patient identified by armband, taken  to the operating room at [facility].  Appropriate anesthetic  monitors were attached and general endotracheal anesthesia induced with  the patient in the supine position.  Tourniquet was applied high to the  left thigh.  He received 1 gram of Ancef  preoperatively.  Lateral post  and foot positioner applied to the table and the left lower extremity  prepped and draped in the usual sterile fashion from the ankle to the  midthigh.  The limb was wrapped with an Esmarch bandage, tourniquet  inflated to 300 mmHg and we began the procedure by making an anterior  midline incision starting 7 or 8 cm above the patella, going over the  center of the patella and then 2 cm distal, 2 and 1 cm medial to the  tibial tubercle.  Small bleeders in the skin and subcutaneous tissue  were identified and cauterized.  The transverse retinaculum was incised  in line with the skin incision allowing a medial parapatellar  arthrotomy.  The patella was everted.  Prepatellar fat pad resected.  Superficial medial collateral ligament was elevated off the proximal  tibia, left intact distally going from anterior to posterior.  The knee  was then hyperflexed exposing the arthritic bone down to bare bone  medially, patches of bare bone laterally, large osteophytes which were  resected with rongeurs and osteotomes.  To protect the posterior tibia a  posteromedial Z retractor was placed, a  Mikhail retractor through the  notch and laterally a Homan retractor.  We then entered the proximal  tibia with the DePuy step drill followed by the intramedullary rod and  the 0 degree posterior slope cutting guide.  We resected about 8 mm of  bone medially, 10 mm of bone laterally with a power saw and then  directed our attention to the distal femur which we entered 2 mm  anterior to the PCL origin with the intramedullary guide and then set up  a cut with a 5 degree left cutting guide set at 12 mm, angled along the  epicondylar axis.  After the distal femoral cut was accomplished we  sized for a #4 left femoral component and placed the pins in 3 degrees  of external rotation.  Brought up the chamfer cutting guide and  performed our anterior, posterior and chamfer cuts without  difficulty  followed by the box cut.  We then checked our extension gap and the knee  now came to full extension.  He had a 15 degree flexion contracture to  begin with.  The everted patella was then measured at 26 mm.  We felt a  35 or 38 patellar button would be required.  Set the cutting guide at 16  mm and  performed our posterior patellar resection sized for a 38 button  and drilled.  The knee was then hyperflexed, exposing the proximal  tibia, which we sized to #5 tibial baseplate which was pinned into place  followed by the smokestack and the conical reamer.  Because the bone  quality was excellent we used a power saw to cut the thin cuts in the  trial baseplate and then the bullet and fins were hammered into place.  A 4 left trial femoral component was then hammered into place.  A 10-mm  Sigma RP trial spacer was placed on the tibial baseplate and a 38 trial  button.  The knee came to full extension, flexed to 135 degrees and no  thumb pressure was required for patellar stabilization.  The patella  tracked excellent.  At this point the trial components were removed.  All bony surfaces were water picked clean, dried with suction and  sponges.  At the back table a double batch of DePuy 1 cement was mixed  with 1500 mg of Zinacef.  The monomer was then added and after the  cement had been mixed for 2 minutes it was applied to the bony metallic  mating surfaces except for the posterior condyles of the femur itself.  In order we hammered into place a #5 tibial baseplate and removed excess  cement, a 4 left femoral component and removed excess cement.  The 38 mm  patellar button was squeezed into place.  A 10 mm #4 Sigma RP spacer was  then snapped into place and the knee reduced.  We brought him to full  extension, squeezed out a little bit more cement with compression and  removed out with Glorious Peach elevators and then the cement was allowed to cure as we placed medium Hemovac drains in the  wound.  The wound was then  water picked clean one more time and dried and then FloSeal was applied  to all the synovial surfaces and the tendon surfaces with potential  bleeding.  We used a 10 cc syringe of FloSeal.  Parapatellar arthrotomy  was then closed with running #1 Vicryl suture.  The subcutaneous tissue  with 0 and 2-0 undyed Vicryl suture and  the skin with skin staples.  A  dressing of Xeroform, 4x4 dressing sponges, Webril and an Ace wrap were  applied.  The tourniquet was let down.  The patient was awakened and  taken to the recovery room without difficulty.      Feliberto Gottron. Turner Daniels, M.D.  Electronically Signed     FJR/MEDQ  D:  02/12/2007  T:  02/12/2007  Job:  [id number]

## 2011-02-23 NOTE — Discharge Summary (Signed)
NAME:  Frank Johnston, Frank Johnston            ACCOUNT NO.:  [id number]  MEDICAL RECORD NO.:  [id number]           PATIENT TYPE:  I  LOCATION:  1618                         FACILITY:  [facility]  PHYSICIAN:  Ollen Gross, M.D.    DATE OF BIRTH:  18-Jan-1941  DATE OF ADMISSION:  12/29/2010 DATE OF DISCHARGE:  01/01/2011                              DISCHARGE SUMMARY   ADMITTING DIAGNOSES: 1. Failed left total-knee arthroplasty. 2. Tinnitus. 3. Cataracts. 4. Hypertension. 5. Hiatal hernia. 6. Reflux disease. 7. Hemorrhoids. 8. Past history of postoperative ileus. 9. History of renal calculi. 10.Osteoarthritis. 11.Childhood illnesses of measles and mumps.  DISCHARGE DIAGNOSES: 1. Failed left total-knee arthroplasty status post left total-knee     arthroplasty revision. 2. Tinnitus. 3. Cataracts. 4. Hypertension. 5. Hiatal hernia. 6. Reflux disease. 7. Hemorrhoids. 8. Past history of postoperative ileus. 9. History of renal calculi. 10.Osteoarthritis. 11.Childhood illnesses of measles and mumps.  PROCEDURE:  December 29, 2010, left total-knee arthroplasty revision.  SURGEON:  Ollen Gross, M.D.  ASSISTANT:  Alexzandrew L. Perkins, P.A.C.  TOURNIQUET TIME:  41 minutes, then down for 8 minutes, then up an additional 22 minutes.  ANESTHESIA:  Surgery was under general anesthesia.  CONSULTATIONS:  None.  BRIEF HISTORY:  Frank Johnston is a 70 year old male with a left total- knee done approximately 4 years ago who had persistent pain and stiffness.  Workup for infection was negative.  His range of motion was only 10 to about 75.  Bone scan showed some loosening of the components. He now presents for scar excision and also revision arthroplasty.  LABORATORY DATA:  The admission CBC for this patient was not scanned into the chart.  Serial CBCs were followed daily, though.  Hemoglobin was 12.7 postoperatively.  It drifted down.  The last noted H and H was 11.2 and 33.5.  White count  went up from 8.4 to 11.5 and back down to 10.3.  The admission Chemistry panel was not scanned in.  However, BMETs were followed for 48 hours.  Electrolytes remained within normal limits. Blood group type A positive.  The stat Gram stain was taken at the time of surgery.  No organisms were noted.  No WBCs.  Wound culture and fluid culture taken.  No organisms on the smear.  No growth on the culture. Anaerobe culture, no organisms seen on the smear.  No anaerobes were isolated.  HOSPITAL COURSE:  The patient was admitted to [facility], taken to the OR, and underwent the above-stated procedure without complication.  The patient tolerated the procedure well and was later transferred to the recovery room on the orthopedic floor.  He was given 24 hours postoperative IV antibiotics.  He was started on IV pain medications.  He had a fair amount of pain through the night of surgery. He was doing a little bit better on the morning of day one.  The Hemovac drain placed at the time of the OR was discontinued.  He had excellent urinary output.  He was started back on all of his home medications.  We encouraged bowel medications also and put him on IV Reglan because of his past  history of an ileus following his initial knee replacement 4 years ago.  We wanted to avoid this.  He had started getting up out of bed on day one.  By day two, pain was under better control.  He was doing a little bit better with his therapy.  We changed the dressing and the incision looked good.  No signs of infection.  His hemoglobin was 11.2 at that point.  Pressure was stable.  Continued to progress well and by day three he was tolerating his medications, progressing with therapy and discharged home.  DISCHARGE PLAN:  The patient was discharged home on January 01, 2011.  DISCHARGE DIAGNOSES:  Please see above.  DISCHARGE MEDICATIONS:  Robaxin, Percocet, Xarelto.  Continue aspirin, Diovan HCTZ, fluticasone,  metoclopramide, omeprazole and verapamil.  DIET:  Cardiac diet.  ACTIVITIES:  Weightbearing as tolerated.  Total-knee protocol.  Home health PT, home health nursing.  FOLLOWUP:  Follow up in 2 weeks.  DISPOSITION:  Home.  CONDITION ON DISCHARGE:  Improving.     Alexzandrew L. Julien Girt, P.A.C.   ______________________________ Ollen Gross, M.D.    ALP/MEDQ  D:  02/03/2011  T:  02/03/2011  Job:  [id number]  cc:   Barry Dienes. Eloise Harman, M.D. Fax: [phone]  Vesta Mixer, M.D. Fax: [phone]  Electronically Signed by Patrica Duel P.A.C. on 02/17/2011 10:33:33 AM Electronically Signed by Ollen Gross M.D. on 02/23/2011 12:52:12 PM

## 2011-03-04 HISTORY — PX: KNEE CLOSED REDUCTION: SHX995

## 2011-03-11 ENCOUNTER — Other Ambulatory Visit: Payer: Self-pay | Admitting: Orthopedic Surgery

## 2011-03-11 ENCOUNTER — Encounter (HOSPITAL_COMMUNITY): Payer: Medicare Other

## 2011-03-11 LAB — COMPREHENSIVE METABOLIC PANEL
ALT: 17 U/L (ref 0–53)
Alkaline Phosphatase: 86 U/L (ref 39–117)
BUN: 15 mg/dL (ref 6–23)
CO2: 29 mEq/L (ref 19–32)
Chloride: 99 mEq/L (ref 96–112)
GFR calc Af Amer: >60 mL/min (ref >60)
GFR calc non Af Amer: >60 mL/min (ref >60)
Glucose, Bld: 79 mg/dL (ref 70–99)
Potassium: 4.3 mEq/L (ref 3.5–5.1)
Sodium: 136 mEq/L (ref 135–145)
Total Bilirubin: 0.3 mg/dL (ref 0.3–1.2)

## 2011-03-11 LAB — URINALYSIS, ROUTINE W REFLEX MICROSCOPIC
Glucose, UA: NEGATIVE mg/dL
Ketones, ur: NEGATIVE mg/dL
Leukocytes, UA: NEGATIVE
Nitrite: NEGATIVE
Specific Gravity, Urine: 1.01 (ref 1.005–1.030)
pH: 6.5 (ref 5.0–8.0)

## 2011-03-11 LAB — PROTIME-INR: Prothrombin Time: 13 seconds (ref 11.6–15.2)

## 2011-03-11 LAB — CBC
HCT: 43.2 % (ref 39.0–52.0)
Hemoglobin: 14.5 g/dL (ref 13.0–17.0)
RBC: 5.14 MIL/uL (ref 4.22–5.81)

## 2011-03-11 LAB — SURGICAL PCR SCREEN: MRSA, PCR: NEGATIVE

## 2011-03-17 ENCOUNTER — Ambulatory Visit (HOSPITAL_COMMUNITY)
Admission: RE | Admit: 2011-03-17 | Discharge: 2011-03-17 | Disposition: A | Discharge status: Home / Self Care | Payer: Medicare Other | Source: Ambulatory Visit | Attending: Orthopedic Surgery | Admitting: Orthopedic Surgery

## 2011-03-17 DIAGNOSIS — M1710 Unilateral primary osteoarthritis, unspecified knee: Secondary | ICD-10-CM | POA: Insufficient documentation

## 2011-03-17 DIAGNOSIS — M24669 Ankylosis, unspecified knee: Secondary | ICD-10-CM | POA: Insufficient documentation

## 2011-03-17 DIAGNOSIS — K219 Gastro-esophageal reflux disease without esophagitis: Secondary | ICD-10-CM | POA: Insufficient documentation

## 2011-03-17 DIAGNOSIS — Z01812 Encounter for preprocedural laboratory examination: Secondary | ICD-10-CM | POA: Insufficient documentation

## 2011-03-17 DIAGNOSIS — Z96659 Presence of unspecified artificial knee joint: Secondary | ICD-10-CM | POA: Insufficient documentation

## 2011-03-17 DIAGNOSIS — I10 Essential (primary) hypertension: Secondary | ICD-10-CM | POA: Insufficient documentation

## 2011-03-28 NOTE — Op Note (Signed)
  NAME:  Frank Johnston, Frank Johnston NO.:  [id number]  MEDICAL RECORD NO.:  [id number]  LOCATION:  DAYL                         FACILITY:  [facility]  PHYSICIAN:  Ollen Gross, M.D.    DATE OF BIRTH:  04-Jan-1941  DATE OF PROCEDURE:  03/17/2011 DATE OF DISCHARGE:  03/17/2011                              OPERATIVE REPORT   PREOPERATIVE DIAGNOSES: 1. Arthrofibrosis of left knee. 2. Osteoarthritis of right knee.  POSTOPERATIVE DIAGNOSES: 1. Arthrofibrosis of left knee. 2. Osteoarthritis of right knee.  PROCEDURES: 1. Close manipulation, left knee. 2. Right knee cortisone injection.  SURGEON:  Ollen Gross, M.D.  ASSISTANT:  None.  ANESTHESIA:  General.  Pre-manipulation range of motion left knee 5 to 90, post-manipulation range of motion left knee 5 to 120.  COMPLICATIONS:  None.  CONDITION:  Stable to Recovery.  BRIEF CLINICAL NOTE:  Mr. Mccauslin who is a 70 year old male had a left total knee arthroplasty revision performed approximately 2-1/2 months ago.  He has done very well from a pain relief standpoint, but has had stiffness in the knee.  He has been able to get to about 90 degrees in physical therapy with a 5-degree flexion contracture.  We discussed the options for treatment and decided to go ahead and pursue a close manipulation.  He presents now for that.  In addition, he has end-stage arthritis of the right knee.  We had previously discussed injections and felt that when he was under anesthesia, we would go ahead and do a cortisone injection in the right knee to help relieve the arthritic pain.  He presents now for those procedures.  PROCEDURE IN DETAIL:  After successful administration of general anesthetic, I prepped the right knee with Betadine and injected with 4 cc of 1% lidocaine and 80 mg of Depo-Medrol with no problems.  The incision sites were cleaned with alcohol and then a Band-Aid was placed.  I subsequently did an exam under anesthesia of  the left knee.  Range of motion was 5 to 90.  I placed my chest on his proximal tibia and then flexed the knee with audible lysis of adhesions.  I was able to get him flexed to 120, still lacked about 5 degrees in full extension.  He was subsequently awakened and transported to Recovery in stable condition.     Ollen Gross, M.D.     FA/MEDQ  D:  03/17/2011  T:  03/17/2011  Job:  [id number]  Electronically Signed by Ollen Gross M.D. on 03/28/2011 05:02:03 PM

## 2011-11-03 DIAGNOSIS — E785 Hyperlipidemia, unspecified: Secondary | ICD-10-CM | POA: Diagnosis not present

## 2011-11-03 DIAGNOSIS — Z125 Encounter for screening for malignant neoplasm of prostate: Secondary | ICD-10-CM | POA: Diagnosis not present

## 2011-11-03 DIAGNOSIS — I10 Essential (primary) hypertension: Secondary | ICD-10-CM | POA: Diagnosis not present

## 2011-11-10 DIAGNOSIS — M6530 Trigger finger, unspecified finger: Secondary | ICD-10-CM | POA: Diagnosis not present

## 2011-11-10 DIAGNOSIS — Z79899 Other long term (current) drug therapy: Secondary | ICD-10-CM | POA: Diagnosis not present

## 2011-11-10 DIAGNOSIS — Z125 Encounter for screening for malignant neoplasm of prostate: Secondary | ICD-10-CM | POA: Diagnosis not present

## 2011-11-10 DIAGNOSIS — I10 Essential (primary) hypertension: Secondary | ICD-10-CM | POA: Diagnosis not present

## 2011-11-10 DIAGNOSIS — Z0000 Encounter for general adult medical examination without abnormal findings: Secondary | ICD-10-CM | POA: Diagnosis not present

## 2011-11-14 DIAGNOSIS — Z1212 Encounter for screening for malignant neoplasm of rectum: Secondary | ICD-10-CM | POA: Diagnosis not present

## 2011-11-21 DIAGNOSIS — G5600 Carpal tunnel syndrome, unspecified upper limb: Secondary | ICD-10-CM | POA: Diagnosis not present

## 2011-11-21 DIAGNOSIS — M6530 Trigger finger, unspecified finger: Secondary | ICD-10-CM | POA: Diagnosis not present

## 2011-11-21 DIAGNOSIS — M19049 Primary osteoarthritis, unspecified hand: Secondary | ICD-10-CM | POA: Diagnosis not present

## 2011-11-29 DIAGNOSIS — G5600 Carpal tunnel syndrome, unspecified upper limb: Secondary | ICD-10-CM | POA: Diagnosis not present

## 2011-11-30 ENCOUNTER — Other Ambulatory Visit: Payer: Self-pay | Admitting: Orthopedic Surgery

## 2011-12-01 ENCOUNTER — Encounter (HOSPITAL_BASED_OUTPATIENT_CLINIC_OR_DEPARTMENT_OTHER): Payer: Self-pay | Admitting: *Deleted

## 2011-12-01 ENCOUNTER — Other Ambulatory Visit: Payer: Self-pay

## 2011-12-01 ENCOUNTER — Encounter (HOSPITAL_BASED_OUTPATIENT_CLINIC_OR_DEPARTMENT_OTHER)
Admission: RE | Admit: 2011-12-01 | Discharge: 2011-12-01 | Disposition: A | Discharge status: Home / Self Care | Payer: Medicare Other | Source: Ambulatory Visit | Attending: Orthopedic Surgery | Admitting: Orthopedic Surgery

## 2011-12-01 DIAGNOSIS — M65849 Other synovitis and tenosynovitis, unspecified hand: Secondary | ICD-10-CM | POA: Diagnosis not present

## 2011-12-01 DIAGNOSIS — G5600 Carpal tunnel syndrome, unspecified upper limb: Secondary | ICD-10-CM | POA: Diagnosis not present

## 2011-12-01 DIAGNOSIS — K449 Diaphragmatic hernia without obstruction or gangrene: Secondary | ICD-10-CM | POA: Diagnosis not present

## 2011-12-01 DIAGNOSIS — M6530 Trigger finger, unspecified finger: Secondary | ICD-10-CM | POA: Diagnosis not present

## 2011-12-01 DIAGNOSIS — Z01810 Encounter for preprocedural cardiovascular examination: Secondary | ICD-10-CM | POA: Diagnosis not present

## 2011-12-01 DIAGNOSIS — M65839 Other synovitis and tenosynovitis, unspecified forearm: Secondary | ICD-10-CM | POA: Diagnosis not present

## 2011-12-01 DIAGNOSIS — K219 Gastro-esophageal reflux disease without esophagitis: Secondary | ICD-10-CM | POA: Diagnosis not present

## 2011-12-01 DIAGNOSIS — I10 Essential (primary) hypertension: Secondary | ICD-10-CM | POA: Diagnosis not present

## 2011-12-01 LAB — BASIC METABOLIC PANEL
BUN: 19 mg/dL (ref 6–23)
CO2: 25 mEq/L (ref 19–32)
Chloride: 103 mEq/L (ref 96–112)
GFR calc Af Amer: 82 mL/min — ABNORMAL LOW (ref >90)
Glucose, Bld: 113 mg/dL — ABNORMAL HIGH (ref 70–99)
Potassium: 3.9 mEq/L (ref 3.5–5.1)

## 2011-12-01 NOTE — Progress Notes (Signed)
To come in for bmet-ekg  

## 2011-12-06 ENCOUNTER — Encounter (HOSPITAL_BASED_OUTPATIENT_CLINIC_OR_DEPARTMENT_OTHER)
Admission: RE | Disposition: A | Discharge status: Home / Self Care | Payer: Self-pay | Source: Ambulatory Visit | Attending: Orthopedic Surgery

## 2011-12-06 ENCOUNTER — Encounter (HOSPITAL_BASED_OUTPATIENT_CLINIC_OR_DEPARTMENT_OTHER): Payer: Self-pay | Admitting: Anesthesiology

## 2011-12-06 ENCOUNTER — Ambulatory Visit (HOSPITAL_BASED_OUTPATIENT_CLINIC_OR_DEPARTMENT_OTHER)
Admission: RE | Admit: 2011-12-06 | Discharge: 2011-12-06 | Disposition: A | Discharge status: Home / Self Care | Payer: Medicare Other | Source: Ambulatory Visit | Attending: Orthopedic Surgery | Admitting: Orthopedic Surgery

## 2011-12-06 ENCOUNTER — Ambulatory Visit (HOSPITAL_BASED_OUTPATIENT_CLINIC_OR_DEPARTMENT_OTHER): Payer: Medicare Other | Admitting: Anesthesiology

## 2011-12-06 ENCOUNTER — Encounter (HOSPITAL_BASED_OUTPATIENT_CLINIC_OR_DEPARTMENT_OTHER): Payer: Self-pay | Admitting: Orthopedic Surgery

## 2011-12-06 DIAGNOSIS — K219 Gastro-esophageal reflux disease without esophagitis: Secondary | ICD-10-CM | POA: Insufficient documentation

## 2011-12-06 DIAGNOSIS — M6530 Trigger finger, unspecified finger: Secondary | ICD-10-CM | POA: Diagnosis not present

## 2011-12-06 DIAGNOSIS — I10 Essential (primary) hypertension: Secondary | ICD-10-CM | POA: Insufficient documentation

## 2011-12-06 DIAGNOSIS — G5600 Carpal tunnel syndrome, unspecified upper limb: Secondary | ICD-10-CM | POA: Diagnosis not present

## 2011-12-06 DIAGNOSIS — M65839 Other synovitis and tenosynovitis, unspecified forearm: Secondary | ICD-10-CM | POA: Insufficient documentation

## 2011-12-06 DIAGNOSIS — Z01810 Encounter for preprocedural cardiovascular examination: Secondary | ICD-10-CM | POA: Insufficient documentation

## 2011-12-06 DIAGNOSIS — K449 Diaphragmatic hernia without obstruction or gangrene: Secondary | ICD-10-CM | POA: Insufficient documentation

## 2011-12-06 DIAGNOSIS — G8918 Other acute postprocedural pain: Secondary | ICD-10-CM | POA: Diagnosis not present

## 2011-12-06 DIAGNOSIS — M25549 Pain in joints of unspecified hand: Secondary | ICD-10-CM | POA: Diagnosis not present

## 2011-12-06 HISTORY — DX: Chronic kidney disease, unspecified: N18.9

## 2011-12-06 HISTORY — DX: Essential (primary) hypertension: I10

## 2011-12-06 HISTORY — DX: Personal history of other diseases of the digestive system: Z87.19

## 2011-12-06 HISTORY — DX: Unspecified osteoarthritis, unspecified site: M19.90

## 2011-12-06 HISTORY — PX: CARPAL TUNNEL RELEASE: SHX101

## 2011-12-06 HISTORY — PX: TRIGGER FINGER RELEASE: SHX641

## 2011-12-06 HISTORY — DX: Gastro-esophageal reflux disease without esophagitis: K21.9

## 2011-12-06 SURGERY — CARPAL TUNNEL RELEASE
Anesthesia: Monitor Anesthesia Care | Site: Hand | Laterality: Right | Wound class: Clean

## 2011-12-06 MED ORDER — PROPOFOL 10 MG/ML IV EMUL
INTRAVENOUS | Status: DC | PRN
  Administered 2011-12-06: 200 mg via INTRAVENOUS

## 2011-12-06 MED ORDER — FENTANYL CITRATE 0.05 MG/ML IJ SOLN
50.0000 ug | INTRAMUSCULAR | Status: DC | PRN
  Administered 2011-12-06: 100 ug via INTRAVENOUS

## 2011-12-06 MED ORDER — CHLORHEXIDINE GLUCONATE 4 % EX LIQD: 60.0000 mL | Freq: Once | CUTANEOUS | Status: DC

## 2011-12-06 MED ORDER — ONDANSETRON HCL 4 MG/2ML IJ SOLN
INTRAMUSCULAR | Status: DC | PRN
  Administered 2011-12-06: 4 mg via INTRAVENOUS

## 2011-12-06 MED ORDER — LACTATED RINGERS IV SOLN
INTRAVENOUS | Status: DC
  Administered 2011-12-06 (×2): via INTRAVENOUS

## 2011-12-06 MED ORDER — LIDOCAINE HCL (CARDIAC) 20 MG/ML IV SOLN
INTRAVENOUS | Status: DC | PRN
  Administered 2011-12-06: 60 mg via INTRAVENOUS

## 2011-12-06 MED ORDER — MIDAZOLAM HCL 2 MG/2ML IJ SOLN
1.0000 mg | INTRAMUSCULAR | Status: DC | PRN
  Administered 2011-12-06: 2 mg via INTRAVENOUS

## 2011-12-06 MED ORDER — PROMETHAZINE HCL 25 MG/ML IJ SOLN: 6.2500 mg | INTRAMUSCULAR | Status: DC | PRN

## 2011-12-06 MED ORDER — LIDOCAINE-EPINEPHRINE 1.5-1:200000 % IJ SOLN
INTRAMUSCULAR | Status: DC | PRN
  Administered 2011-12-06: 30 mL via INTRADERMAL

## 2011-12-06 MED ORDER — CEFAZOLIN SODIUM-DEXTROSE 2-3 GM-% IV SOLR
2.0000 g | INTRAVENOUS | Status: AC
  Administered 2011-12-06: 2 g via INTRAVENOUS

## 2011-12-06 MED ORDER — SODIUM CHLORIDE 0.9 % IR SOLN: Status: DC | PRN

## 2011-12-06 MED ORDER — HYDROMORPHONE HCL PF 1 MG/ML IJ SOLN: 0.2500 mg | INTRAMUSCULAR | Status: DC | PRN

## 2011-12-06 MED ORDER — EPHEDRINE SULFATE 50 MG/ML IJ SOLN
INTRAMUSCULAR | Status: DC | PRN
  Administered 2011-12-06: 10 mg via INTRAVENOUS

## 2011-12-06 MED ORDER — 0.9 % SODIUM CHLORIDE (POUR BTL) OPTIME
TOPICAL | Status: DC | PRN
  Administered 2011-12-06: 1000 mL

## 2011-12-06 MED ORDER — DEXAMETHASONE SODIUM PHOSPHATE 10 MG/ML IJ SOLN
INTRAMUSCULAR | Status: DC | PRN
  Administered 2011-12-06: 10 mg via INTRAVENOUS

## 2011-12-06 MED ORDER — CEFAZOLIN SODIUM 1-5 GM-% IV SOLN: 1.0000 g | INTRAVENOUS | Status: DC

## 2011-12-06 MED ORDER — MEPERIDINE HCL 25 MG/ML IJ SOLN: 6.2500 mg | INTRAMUSCULAR | Status: DC | PRN

## 2011-12-06 MED ORDER — HYDROCODONE-ACETAMINOPHEN 5-500 MG PO TABS: 1.0000 | ORAL_TABLET | ORAL | Status: AC | PRN

## 2011-12-06 SURGICAL SUPPLY — 40 items
BANDAGE COBAN STERILE 2 (GAUZE/BANDAGES/DRESSINGS) IMPLANT
BANDAGE GAUZE ELAST BULKY 4 IN (GAUZE/BANDAGES/DRESSINGS) ×2 IMPLANT
BLADE SURG 15 STRL LF DISP TIS (BLADE) ×1 IMPLANT
BLADE SURG 15 STRL SS (BLADE) ×2
BNDG CMPR 9X4 STRL LF SNTH (GAUZE/BANDAGES/DRESSINGS) ×1
BNDG COHESIVE 3X5 TAN STRL LF (GAUZE/BANDAGES/DRESSINGS) ×2 IMPLANT
BNDG ESMARK 4X9 LF (GAUZE/BANDAGES/DRESSINGS) ×2 IMPLANT
CHLORAPREP W/TINT 26ML (MISCELLANEOUS) ×2 IMPLANT
CLOTH BEACON ORANGE TIMEOUT ST (SAFETY) ×2 IMPLANT
CORDS BIPOLAR (ELECTRODE) ×2 IMPLANT
COVER MAYO STAND STRL (DRAPES) ×2 IMPLANT
COVER TABLE BACK 60X90 (DRAPES) ×2 IMPLANT
CUFF TOURNIQUET SINGLE 18IN (TOURNIQUET CUFF) ×2 IMPLANT
DECANTER SPIKE VIAL GLASS SM (MISCELLANEOUS) IMPLANT
DRAPE EXTREMITY T 121X128X90 (DRAPE) ×2 IMPLANT
DRAPE SURG 17X23 STRL (DRAPES) ×2 IMPLANT
DRSG KUZMA FLUFF (GAUZE/BANDAGES/DRESSINGS) ×2 IMPLANT
GAUZE SPONGE 4X4 12PLY STRL LF (GAUZE/BANDAGES/DRESSINGS) ×2 IMPLANT
GAUZE XEROFORM 1X8 LF (GAUZE/BANDAGES/DRESSINGS) ×2 IMPLANT
GLOVE BIO SURGEON STRL SZ 6.5 (GLOVE) ×2 IMPLANT
GLOVE SURG ORTHO 8.0 STRL STRW (GLOVE) ×2 IMPLANT
GOWN BRE IMP PREV XXLGXLNG (GOWN DISPOSABLE) ×4 IMPLANT
GOWN PREVENTION PLUS XLARGE (GOWN DISPOSABLE) ×2 IMPLANT
NEEDLE 27GAX1X1/2 (NEEDLE) IMPLANT
NS IRRIG 1000ML POUR BTL (IV SOLUTION) ×2 IMPLANT
PACK BASIN DAY SURGERY FS (CUSTOM PROCEDURE TRAY) ×2 IMPLANT
PAD CAST 3X4 CTTN HI CHSV (CAST SUPPLIES) ×1 IMPLANT
PADDING CAST ABS 4INX4YD NS (CAST SUPPLIES) ×1
PADDING CAST ABS COTTON 4X4 ST (CAST SUPPLIES) ×1 IMPLANT
PADDING CAST COTTON 3X4 STRL (CAST SUPPLIES) ×2
PADDING WEBRIL 3 STERILE (GAUZE/BANDAGES/DRESSINGS) ×2 IMPLANT
SPONGE GAUZE 4X4 12PLY (GAUZE/BANDAGES/DRESSINGS) ×2 IMPLANT
STOCKINETTE 4X48 STRL (DRAPES) ×2 IMPLANT
SUT VICRYL 4-0 PS2 18IN ABS (SUTURE) ×2 IMPLANT
SUT VICRYL RAPIDE 4/0 PS 2 (SUTURE) ×4 IMPLANT
SYR BULB 3OZ (MISCELLANEOUS) ×2 IMPLANT
SYR CONTROL 10ML LL (SYRINGE) IMPLANT
TOWEL OR 17X24 6PK STRL BLUE (TOWEL DISPOSABLE) ×6 IMPLANT
UNDERPAD 30X30 INCONTINENT (UNDERPADS AND DIAPERS) ×2 IMPLANT
WATER STERILE IRR 1000ML POUR (IV SOLUTION) IMPLANT

## 2011-12-06 NOTE — Op Note (Signed)
Dictated note: [phone]

## 2011-12-06 NOTE — H&P (Signed)
Frank Johnston is a 71 year old right hand dominant male referred by Dr. Jarome Matin for a consultation with respect to catching of his right thumb and bilateral ring fingers. This has been going on for approximately 3 months. He has no history of injury. He complains of intermittent, moderate to sharp pain with a feeling of swelling. This occasionally awakens him at night. Activity makes it worse and rest makes it better. He has taken Tylenol. He has a history of arthritis. He has no history of thyroid problems, diabetes or gout. There is no family history of diabetes. There is a family history of arthritis.  Past Medical History: He has no allergies. He is on the following medications: Diovan HCT, Verapamil, Omeprazole, Metoclopramide, aspirin, Vitamin E, multivitamins, Tramadol/APAP, and Fluticasone Propionate nasal spray. He has had a cholecystectomy, back surgery, knee arthroscopy, knee replacement times 2 on the left side.  Family Medical History: Positive for high BP and arthritis.  Social History: He does not smoke or drink. He is married and retired.  Review of Systems: Positive for glasses, cataracts, ringing in his ears, high BP, otherwise negative for 14 points. Frank Johnston is an 71 y.o. male.   Chief Complaint: sts finger cts rt HPI: see above  Past Medical History  Diagnosis Date  . Hypertension   . GERD (gastroesophageal reflux disease)   . Arthritis   . Chronic kidney disease     hx kidney stones  . H/O hiatal hernia     Past Surgical History  Procedure Date  . Joint replacement 4/12    lt total knee  . Knee closed reduction 6/12    lt -after lt total knee  . Cholecystectomy 1976  . Appendectomy 1976  . Knee arthroscopy     rt and lt  . Colonoscopy   . Back surgery 1984    lumbar lam    History reviewed. No pertinent family history. Social History:  reports that he quit smoking about 17 years ago. He does not have any smokeless tobacco history on  file. He reports that he does not drink alcohol or use illicit drugs.  Allergies: No Known Allergies  No current facility-administered medications on file as of .   Medications Prior to Admission  Medication Sig Dispense Refill  . fluticasone (FLONASE) 50 MCG/ACT nasal spray Place 2 sprays into the nose as needed.      Marland Kitchen omeprazole (PRILOSEC) 40 MG capsule Take 40 mg by mouth every evening.      . therapeutic multivitamin-minerals (THERAGRAN-M) tablet Take 1 tablet by mouth daily.      . traMADol-acetaminophen (ULTRACET) 37.5-325 MG per tablet Take 1 tablet by mouth every 6 (six) hours as needed.      . valsartan-hydrochlorothiazide (DIOVAN-HCT) 320-12.5 MG per tablet Take 1 tablet by mouth daily.      . verapamil (COVERA HS) 240 MG (CO) 24 hr tablet Take 240 mg by mouth at bedtime.        No results found for this or any previous visit (from the past 48 hour(s)).  No results found.   Pertinent items are noted in HPI.  Height 5\' 9"  (1.753 m), weight 90.719 kg (200 lb).  General appearance: alert, cooperative and appears stated age Head: Normocephalic, without obvious abnormality Neck: no adenopathy Resp: clear to auscultation bilaterally Cardio: regular rate and rhythm, S1, S2 normal, no murmur, click, rub or gallop GI: soft, non-tender; bowel sounds normal; no masses,  no organomegaly Extremities: extremities normal, atraumatic,  no cyanosis or edema Pulses: 2+ and symmetric Skin: Skin color, texture, turgor normal. No rashes or lesions Neurologic: Grossly normal Incision/Wound: na  Assessment/Plan He has had his nerve conductions done. This reveals that he has carpal tunnel syndrome bilaterally with motor delay is WNL, sensory delay is 2.4 on each side. We have discussed with him the possibility of injection vs simple surgical release as an option. He would like to proceed to have this taken care of surgically. He would like to have the right side done. He is scheduled for  right carpal tunnel release with release of STS right thumb, middle and ring finger. The pre, peri and post op course are discussed along with risks and complications.  He is aware there is no guarantee with surgery, possibility of infection, recurrence, injury to arteries, nerves and tendons, incomplete relief of symptoms and dystrophy.    Harvest Deist R 12/06/2011, 6:39 AM

## 2011-12-06 NOTE — Progress Notes (Signed)
Assisted Dr. Massagee with right, ultrasound guided, supraclavicular block. Side rails up, monitors on throughout procedure. See vital signs in flow sheet. Tolerated Procedure well. 

## 2011-12-06 NOTE — Op Note (Signed)
NAME:  Frank Johnston, Frank Johnston NO.:  [id number]  MEDICAL RECORD NO.:  [id number]  LOCATION:                                 FACILITY:  PHYSICIAN:  Cindee Salt, M.D.            DATE OF BIRTH:  DATE OF PROCEDURE:  12/06/2011 DATE OF DISCHARGE:                              OPERATIVE REPORT   PREOPERATIVE DIAGNOSES:  Stenosing tenosynovitis, right thumb, middle and ring fingers.  Carpal tunnel syndrome, right hand.  POSTOPERATIVE DIAGNOSES:  Stenosing tenosynovitis, right thumb, middle and ring fingers. Carpal tunnel syndrome, right hand.  OPERATION:  Release A1 pulley, right thumb, right middle, right ring finger with release of right carpal canal.  SURGEON:  Cindee Salt, M.D.  ASSISTANT:  Betha Loa, M.D.  ANESTHESIA:  Axillary, general.  ANESTHESIOLOGIST:  Burna Forts, M.D.  HISTORY:  The patient is a 71 year old male with a history of triggering of the thumb, middle, and ring fingers; numbness and tingling of all of the median nerve distribution.  Nerve conductions are positive.  This is not responded to conservative treatment.  He has elected to undergo surgical release of each of the structures.  Pre, peri, postoperative course have been discussed along with risks and complications.  He is aware that there is no guarantee with the surgery; possibility of infection; recurrence injury to arteries, nerves, tendons; incomplete relief of symptoms and dystrophy.  In the preoperative area, the patient is seen.  The extremity marked by both the patient and surgeon. Antibiotic given.  PROCEDURE:  The patient was brought to the operating room where axillary block was carried out without difficulty.  General anesthesia also given.  He was prepped using ChloraPrep, supine position, right arm free.  A 3-minute dry time was allowed.  Time-out taken, confirming the patient and procedure.  The limb was exsanguinated with an Esmarch bandage.  Tourniquet placed on the  forearm was inflated to 250 mmHg.  A straight incision was made longitudinally in the palm, carried down through subcutaneous tissue.  Bleeders were electrocauterized.  Palmar fascia was split.  Superficial palmar arch identified.  The flexor tendon to the ring and little finger identified.  To the ulnar side of median nerve, carpal retinaculum was incised with sharp dissection. Right angle and Sewall retractor were placed between skin and forearm fascia.  Fascia was released for approximately a centimeter and half proximal to the wrist crease under direct vision.  The canal was explored.  Area of compression of the nerve was apparent.  No further lesions were identified.  The wound was irrigated and closed with interrupted 4-0 Vicryl repeat sutures.  Separate incision was then made transversely over the metacarpophalangeal joint crease of the thumb, carried down through subcutaneous tissue.  Bleeders again electrocauterized with bipolar.  Retractors were placed protecting the radial and ulnar digital neurovascular bundles.  The A1 pulley was extremely thick and tight.  This was released on its radial aspect.  The oblique pulley left intact.  The thumb placed through a full range motion, no further triggering was noted.  The wound was irrigated and closed with interrupted 4-0 Vicryl Rapide.  An oblique incision was then made  over the middle finger, carried down again through subcutaneous tissue.  The foreign material was immediately encountered.  This was excised.  This appeared to be old metel chips.  The dissection carried down to the A1 pulley.  Retractors were placed protecting neurovascular bundles especially on the radial side and a release was performed and an extremely thick A1 pulley of the metacarpal area of the middle finger. Small incision was made in A2 pulley centrally, moderate tenosynovitis was present proximally, this was debrided.  Moderate degeneration of  the superficialis tendon was noted.  No further lesions were identified. The wound was irrigated and closed with interrupted 4-0 Vicryl Rapide. Separate incision was then made obliquely over the ring finger and carried down through subcutaneous tissue.  The A1 pulley was again found to be thickened.  Retractors were placed protecting neurovascular bundles.  The A1 pulley was released on its radial aspect with a small incision made centrally in A2.  Debridement of the tenosynovial tissue proximally was performed.  Finger placed through full range motion and no further triggering was noted.  The wound was again irrigated and closed with interrupted 4-0 Vicryl Rapide sutures.  A sterile compressive dressing was applied with the fingers free.  On deflation of the tourniquet, all fingers immediately pinked.  He was taken to the recovery room for observation in satisfactory condition.          ______________________________ Cindee Salt, M.D.     GK/MEDQ  D:  12/06/2011  T:  12/06/2011  Job:  [id number]

## 2011-12-06 NOTE — Anesthesia Postprocedure Evaluation (Signed)
  Anesthesia Post-op Note  Patient: Frank Johnston  Procedure(s) Performed: Procedure(s) (LRB): CARPAL TUNNEL RELEASE (Right) RELEASE TRIGGER FINGER/A-1 PULLEY (Right)  Patient Location: PACU  Anesthesia Type: GA combined with regional for post-op pain  Level of Consciousness: awake  Airway and Oxygen Therapy: Patient Spontanous Breathing  Post-op Pain: none  Post-op Assessment: Post-op Vital signs reviewed  Post-op Vital Signs: stable  Complications: No apparent anesthesia complications

## 2011-12-06 NOTE — Transfer of Care (Signed)
Immediate Anesthesia Transfer of Care Note  Patient: Frank Johnston  Procedure(s) Performed: Procedure(s) (LRB): CARPAL TUNNEL RELEASE (Right) RELEASE TRIGGER FINGER/A-1 PULLEY (Right)  Patient Location: PACU  Anesthesia Type: General and GA combined with regional for post-op pain  Level of Consciousness: sedated  Airway & Oxygen Therapy: Patient Spontanous Breathing and Patient connected to face mask oxygen  Post-op Assessment: Report given to PACU RN and Post -op Vital signs reviewed and stable  Post vital signs: Reviewed and stable  Complications: No apparent anesthesia complications

## 2011-12-06 NOTE — Anesthesia Procedure Notes (Addendum)
Anesthesia Regional Block:  Supraclavicular block  Pre-Anesthetic Checklist: ,, timeout performed,, Correct Site, Correct Laterality, Correct Procedure, Correct Position, site marked, risks and benefits discussed, Surgical consent,  Laterality: Right and Upper  Prep: chloraprep       Needles:  Injection technique: Single-shot  Needle Type: Echogenic Needle     Needle Length: 10cm 10 cm     Additional Needles:  Procedures: ultrasound guided Supraclavicular block Narrative:  Start time: 12/06/2011 9:50 AM End time: 12/06/2011 10:05 AM Injection made incrementally with aspirations every 5 mL.  Performed by: Personally  Anesthesiologist: T Massagee  Additional Notes: Tolerated well   Procedure Name: LMA Insertion Date/Time: 12/06/2011 10:21 AM Performed by: Delman Goshorn D Pre-anesthesia Checklist: Patient identified, Emergency Drugs available, Suction available and Patient being monitored Patient Re-evaluated:Patient Re-evaluated prior to inductionOxygen Delivery Method: Circle System Utilized Preoxygenation: Pre-oxygenation with 100% oxygen Intubation Type: IV induction Ventilation: Mask ventilation without difficulty LMA: LMA inserted LMA Size: 5.0 Number of attempts: 1 Placement Confirmation: positive ETCO2 Tube secured with: Tape Dental Injury: Teeth and Oropharynx as per pre-operative assessment

## 2011-12-06 NOTE — Discharge Instructions (Addendum)

## 2011-12-06 NOTE — Anesthesia Preprocedure Evaluation (Signed)
Anesthesia Evaluation  Patient identified by MRN, date of birth, ID band Patient awake    History of Anesthesia Complications Negative for: history of anesthetic complications  Airway Mallampati: II  Neck ROM: Full    Dental  (+) Teeth Intact, Caps and Dental Advisory Given   Pulmonary neg pulmonary ROS,  breath sounds clear to auscultation        Cardiovascular hypertension, Rhythm:Regular Rate:Normal     Neuro/Psych negative neurological ROS  negative psych ROS   GI/Hepatic hiatal hernia, GERD-  ,  Endo/Other    Renal/GU      Musculoskeletal   Abdominal (+) + obese,   Peds  Hematology   Anesthesia Other Findings   Reproductive/Obstetrics                           Anesthesia Physical Anesthesia Plan  ASA: II  Anesthesia Plan: MAC   Post-op Pain Management:    Induction: Intravenous  Airway Management Planned: LMA  Additional Equipment:   Intra-op Plan:   Post-operative Plan: Extubation in OR  Informed Consent: I have reviewed the patients History and Physical, chart, labs and discussed the procedure including the risks, benefits and alternatives for the proposed anesthesia with the patient or authorized representative who has indicated his/her understanding and acceptance.   Dental advisory given  Plan Discussed with: CRNA and Surgeon  Anesthesia Plan Comments:         Anesthesia Quick Evaluation

## 2011-12-06 NOTE — Brief Op Note (Signed)
12/06/2011  11:02 AM  PATIENT:  Frank Johnston  71 y.o. male  PRE-OPERATIVE DIAGNOSIS:  cts and trigger thumb, middle, ring, right hand  POST-OPERATIVE DIAGNOSIS:  Carpal Tunnel Syndrone & Trigger finger-Right thumb, Middle & Ring   PROCEDURE:  Procedure(s) (LRB): CARPAL TUNNEL RELEASE (Right) RELEASE TRIGGER FINGER/A-1 PULLEY (Right)  SURGEON:  Surgeon(s) and Role:    * Nicki Reaper, MD - Primary    * Tami Ribas, MD - Assisting  PHYSICIAN ASSISTANT:   ASSISTANTS: K Lemoyne Nestor,MD   ANESTHESIA:   regional and general  EBL:     BLOOD ADMINISTERED:none  DRAINS: none   LOCAL MEDICATIONS USED:  NONE  SPECIMEN:  No Specimen  DISPOSITION OF SPECIMEN:  N/A  COUNTS:  YES  TOURNIQUET:   Total Tourniquet Time Documented: Forearm (Right) - 25 minutes  DICTATION: .Other Dictation: Dictation Number [id number]  PLAN OF CARE: Discharge to home after PACU  PATIENT DISPOSITION:  PACU - hemodynamically stable.

## 2011-12-07 ENCOUNTER — Encounter (HOSPITAL_BASED_OUTPATIENT_CLINIC_OR_DEPARTMENT_OTHER): Payer: Self-pay | Admitting: Orthopedic Surgery

## 2011-12-20 DIAGNOSIS — M6530 Trigger finger, unspecified finger: Secondary | ICD-10-CM | POA: Diagnosis not present

## 2011-12-20 DIAGNOSIS — G5600 Carpal tunnel syndrome, unspecified upper limb: Secondary | ICD-10-CM | POA: Diagnosis not present

## 2011-12-27 DIAGNOSIS — G5600 Carpal tunnel syndrome, unspecified upper limb: Secondary | ICD-10-CM | POA: Diagnosis not present

## 2012-01-05 DIAGNOSIS — M6530 Trigger finger, unspecified finger: Secondary | ICD-10-CM | POA: Diagnosis not present

## 2012-01-05 DIAGNOSIS — G5600 Carpal tunnel syndrome, unspecified upper limb: Secondary | ICD-10-CM | POA: Diagnosis not present

## 2012-01-12 DIAGNOSIS — G5600 Carpal tunnel syndrome, unspecified upper limb: Secondary | ICD-10-CM | POA: Diagnosis not present

## 2012-01-12 DIAGNOSIS — M6530 Trigger finger, unspecified finger: Secondary | ICD-10-CM | POA: Diagnosis not present

## 2012-01-24 DIAGNOSIS — M1710 Unilateral primary osteoarthritis, unspecified knee: Secondary | ICD-10-CM | POA: Diagnosis not present

## 2012-04-24 DIAGNOSIS — I10 Essential (primary) hypertension: Secondary | ICD-10-CM | POA: Diagnosis not present

## 2012-04-24 DIAGNOSIS — B009 Herpesviral infection, unspecified: Secondary | ICD-10-CM | POA: Diagnosis not present

## 2012-05-31 DIAGNOSIS — G5600 Carpal tunnel syndrome, unspecified upper limb: Secondary | ICD-10-CM | POA: Diagnosis not present

## 2012-05-31 DIAGNOSIS — M6530 Trigger finger, unspecified finger: Secondary | ICD-10-CM | POA: Diagnosis not present

## 2012-06-01 DIAGNOSIS — H43399 Other vitreous opacities, unspecified eye: Secondary | ICD-10-CM | POA: Diagnosis not present

## 2012-06-01 DIAGNOSIS — H43819 Vitreous degeneration, unspecified eye: Secondary | ICD-10-CM | POA: Diagnosis not present

## 2012-06-01 DIAGNOSIS — H02839 Dermatochalasis of unspecified eye, unspecified eyelid: Secondary | ICD-10-CM | POA: Diagnosis not present

## 2012-06-01 DIAGNOSIS — H40019 Open angle with borderline findings, low risk, unspecified eye: Secondary | ICD-10-CM | POA: Diagnosis not present

## 2012-06-01 DIAGNOSIS — H2510 Age-related nuclear cataract, unspecified eye: Secondary | ICD-10-CM | POA: Diagnosis not present

## 2012-06-12 ENCOUNTER — Other Ambulatory Visit: Payer: Self-pay | Admitting: Orthopedic Surgery

## 2012-07-03 DIAGNOSIS — Z23 Encounter for immunization: Secondary | ICD-10-CM | POA: Diagnosis not present

## 2012-08-02 ENCOUNTER — Encounter (HOSPITAL_BASED_OUTPATIENT_CLINIC_OR_DEPARTMENT_OTHER): Payer: Self-pay | Admitting: *Deleted

## 2012-08-02 NOTE — Progress Notes (Signed)
To come in for bmet-did well last surgery here 3/13

## 2012-08-03 ENCOUNTER — Encounter (HOSPITAL_BASED_OUTPATIENT_CLINIC_OR_DEPARTMENT_OTHER)
Admission: RE | Admit: 2012-08-03 | Discharge: 2012-08-03 | Disposition: A | Discharge status: Home / Self Care | Payer: Medicare Other | Source: Ambulatory Visit | Attending: Orthopedic Surgery | Admitting: Orthopedic Surgery

## 2012-08-03 DIAGNOSIS — Z79899 Other long term (current) drug therapy: Secondary | ICD-10-CM | POA: Diagnosis not present

## 2012-08-03 DIAGNOSIS — K219 Gastro-esophageal reflux disease without esophagitis: Secondary | ICD-10-CM | POA: Diagnosis not present

## 2012-08-03 DIAGNOSIS — M6530 Trigger finger, unspecified finger: Secondary | ICD-10-CM | POA: Diagnosis not present

## 2012-08-03 DIAGNOSIS — Z01812 Encounter for preprocedural laboratory examination: Secondary | ICD-10-CM | POA: Diagnosis not present

## 2012-08-03 DIAGNOSIS — I10 Essential (primary) hypertension: Secondary | ICD-10-CM | POA: Diagnosis not present

## 2012-08-03 DIAGNOSIS — M129 Arthropathy, unspecified: Secondary | ICD-10-CM | POA: Diagnosis not present

## 2012-08-03 DIAGNOSIS — Z96659 Presence of unspecified artificial knee joint: Secondary | ICD-10-CM | POA: Diagnosis not present

## 2012-08-03 DIAGNOSIS — G5600 Carpal tunnel syndrome, unspecified upper limb: Secondary | ICD-10-CM | POA: Diagnosis not present

## 2012-08-03 DIAGNOSIS — H269 Unspecified cataract: Secondary | ICD-10-CM | POA: Diagnosis not present

## 2012-08-03 DIAGNOSIS — Z7982 Long term (current) use of aspirin: Secondary | ICD-10-CM | POA: Diagnosis not present

## 2012-08-03 LAB — BASIC METABOLIC PANEL
Chloride: 104 mEq/L (ref 96–112)
Creatinine, Ser: 1.05 mg/dL (ref 0.50–1.35)
GFR calc Af Amer: 80 mL/min — ABNORMAL LOW (ref >90)

## 2012-08-07 ENCOUNTER — Encounter (HOSPITAL_BASED_OUTPATIENT_CLINIC_OR_DEPARTMENT_OTHER): Payer: Self-pay | Admitting: Anesthesiology

## 2012-08-07 ENCOUNTER — Encounter (HOSPITAL_BASED_OUTPATIENT_CLINIC_OR_DEPARTMENT_OTHER)
Admission: RE | Disposition: A | Discharge status: Home / Self Care | Payer: Self-pay | Source: Ambulatory Visit | Attending: Orthopedic Surgery

## 2012-08-07 ENCOUNTER — Ambulatory Visit (HOSPITAL_BASED_OUTPATIENT_CLINIC_OR_DEPARTMENT_OTHER)
Admission: RE | Admit: 2012-08-07 | Discharge: 2012-08-07 | Disposition: A | Discharge status: Home / Self Care | Payer: Medicare Other | Source: Ambulatory Visit | Attending: Orthopedic Surgery | Admitting: Orthopedic Surgery

## 2012-08-07 ENCOUNTER — Ambulatory Visit (HOSPITAL_BASED_OUTPATIENT_CLINIC_OR_DEPARTMENT_OTHER): Payer: Medicare Other | Admitting: Anesthesiology

## 2012-08-07 ENCOUNTER — Encounter (HOSPITAL_BASED_OUTPATIENT_CLINIC_OR_DEPARTMENT_OTHER): Payer: Self-pay | Admitting: *Deleted

## 2012-08-07 DIAGNOSIS — I10 Essential (primary) hypertension: Secondary | ICD-10-CM | POA: Insufficient documentation

## 2012-08-07 DIAGNOSIS — Z79899 Other long term (current) drug therapy: Secondary | ICD-10-CM | POA: Insufficient documentation

## 2012-08-07 DIAGNOSIS — M129 Arthropathy, unspecified: Secondary | ICD-10-CM | POA: Diagnosis not present

## 2012-08-07 DIAGNOSIS — Z01812 Encounter for preprocedural laboratory examination: Secondary | ICD-10-CM | POA: Insufficient documentation

## 2012-08-07 DIAGNOSIS — M6530 Trigger finger, unspecified finger: Secondary | ICD-10-CM | POA: Insufficient documentation

## 2012-08-07 DIAGNOSIS — Z7982 Long term (current) use of aspirin: Secondary | ICD-10-CM | POA: Diagnosis not present

## 2012-08-07 DIAGNOSIS — H269 Unspecified cataract: Secondary | ICD-10-CM | POA: Insufficient documentation

## 2012-08-07 DIAGNOSIS — K219 Gastro-esophageal reflux disease without esophagitis: Secondary | ICD-10-CM | POA: Insufficient documentation

## 2012-08-07 DIAGNOSIS — G5600 Carpal tunnel syndrome, unspecified upper limb: Secondary | ICD-10-CM | POA: Insufficient documentation

## 2012-08-07 DIAGNOSIS — Z96659 Presence of unspecified artificial knee joint: Secondary | ICD-10-CM | POA: Insufficient documentation

## 2012-08-07 HISTORY — PX: TRIGGER FINGER RELEASE: SHX641

## 2012-08-07 HISTORY — PX: CARPAL TUNNEL RELEASE: SHX101

## 2012-08-07 HISTORY — DX: Presence of spectacles and contact lenses: Z97.3

## 2012-08-07 LAB — POCT HEMOGLOBIN-HEMACUE: Hemoglobin: 16.3 g/dL (ref 13.0–17.0)

## 2012-08-07 SURGERY — CARPAL TUNNEL RELEASE
Anesthesia: Regional | Site: Hand | Laterality: Left | Wound class: Clean

## 2012-08-07 MED ORDER — FENTANYL CITRATE 0.05 MG/ML IJ SOLN: 25.0000 ug | INTRAMUSCULAR | Status: DC | PRN

## 2012-08-07 MED ORDER — LACTATED RINGERS IV SOLN
INTRAVENOUS | Status: DC
  Administered 2012-08-07: 08:00:00 via INTRAVENOUS

## 2012-08-07 MED ORDER — PROPOFOL 10 MG/ML IV EMUL
INTRAVENOUS | Status: DC | PRN
  Administered 2012-08-07: 75 ug/kg/min via INTRAVENOUS

## 2012-08-07 MED ORDER — ONDANSETRON HCL 4 MG/2ML IJ SOLN
INTRAMUSCULAR | Status: DC | PRN
  Administered 2012-08-07: 4 mg via INTRAVENOUS

## 2012-08-07 MED ORDER — CHLORHEXIDINE GLUCONATE 4 % EX LIQD: 60.0000 mL | Freq: Once | CUTANEOUS | Status: DC

## 2012-08-07 MED ORDER — MIDAZOLAM HCL 5 MG/5ML IJ SOLN
INTRAMUSCULAR | Status: DC | PRN
  Administered 2012-08-07: 2 mg via INTRAVENOUS

## 2012-08-07 MED ORDER — LIDOCAINE HCL (PF) 0.5 % IJ SOLN
INTRAMUSCULAR | Status: DC | PRN
  Administered 2012-08-07: 35 mL via INTRAVENOUS

## 2012-08-07 MED ORDER — FENTANYL CITRATE 0.05 MG/ML IJ SOLN
INTRAMUSCULAR | Status: DC | PRN
  Administered 2012-08-07 (×2): 50 ug via INTRAVENOUS

## 2012-08-07 MED ORDER — CEFAZOLIN SODIUM-DEXTROSE 2-3 GM-% IV SOLR
2.0000 g | INTRAVENOUS | Status: AC
  Administered 2012-08-07: 2 g via INTRAVENOUS

## 2012-08-07 MED ORDER — BUPIVACAINE HCL (PF) 0.25 % IJ SOLN
INTRAMUSCULAR | Status: DC | PRN
  Administered 2012-08-07: 9 mL

## 2012-08-07 MED ORDER — HYDROCODONE-ACETAMINOPHEN 5-325 MG PO TABS: 1.0000 | ORAL_TABLET | Freq: Four times a day (QID) | ORAL | Status: DC | PRN

## 2012-08-07 SURGICAL SUPPLY — 43 items
BANDAGE COBAN STERILE 2 (GAUZE/BANDAGES/DRESSINGS) ×3 IMPLANT
BANDAGE GAUZE ELAST BULKY 4 IN (GAUZE/BANDAGES/DRESSINGS) ×3 IMPLANT
BLADE SURG 15 STRL LF DISP TIS (BLADE) ×2 IMPLANT
BLADE SURG 15 STRL SS (BLADE) ×3
BNDG CMPR 9X4 STRL LF SNTH (GAUZE/BANDAGES/DRESSINGS)
BNDG COHESIVE 3X5 TAN STRL LF (GAUZE/BANDAGES/DRESSINGS) ×3 IMPLANT
BNDG ESMARK 4X9 LF (GAUZE/BANDAGES/DRESSINGS) IMPLANT
CHLORAPREP W/TINT 26ML (MISCELLANEOUS) ×3 IMPLANT
CLOTH BEACON ORANGE TIMEOUT ST (SAFETY) ×3 IMPLANT
CORDS BIPOLAR (ELECTRODE) ×3 IMPLANT
COVER MAYO STAND STRL (DRAPES) ×3 IMPLANT
COVER TABLE BACK 60X90 (DRAPES) ×3 IMPLANT
CUFF TOURNIQUET SINGLE 18IN (TOURNIQUET CUFF) ×3 IMPLANT
DECANTER SPIKE VIAL GLASS SM (MISCELLANEOUS) IMPLANT
DRAPE EXTREMITY T 121X128X90 (DRAPE) ×3 IMPLANT
DRAPE SURG 17X23 STRL (DRAPES) ×3 IMPLANT
DRSG KUZMA FLUFF (GAUZE/BANDAGES/DRESSINGS) ×3 IMPLANT
GAUZE XEROFORM 1X8 LF (GAUZE/BANDAGES/DRESSINGS) ×3 IMPLANT
GLOVE BIO SURGEON STRL SZ 6.5 (GLOVE) ×3 IMPLANT
GLOVE BIOGEL PI IND STRL 7.0 (GLOVE) ×2 IMPLANT
GLOVE BIOGEL PI IND STRL 8.5 (GLOVE) ×2 IMPLANT
GLOVE BIOGEL PI INDICATOR 7.0 (GLOVE) ×1
GLOVE BIOGEL PI INDICATOR 8.5 (GLOVE) ×1
GLOVE EXAM NITRILE MD LF STRL (GLOVE) ×3 IMPLANT
GLOVE SURG ORTHO 8.0 STRL STRW (GLOVE) ×3 IMPLANT
GOWN BRE IMP PREV XXLGXLNG (GOWN DISPOSABLE) ×3 IMPLANT
GOWN PREVENTION PLUS XLARGE (GOWN DISPOSABLE) ×3 IMPLANT
NEEDLE 27GAX1X1/2 (NEEDLE) ×3 IMPLANT
NS IRRIG 1000ML POUR BTL (IV SOLUTION) ×3 IMPLANT
PACK BASIN DAY SURGERY FS (CUSTOM PROCEDURE TRAY) ×3 IMPLANT
PAD CAST 3X4 CTTN HI CHSV (CAST SUPPLIES) ×2 IMPLANT
PADDING CAST ABS 4INX4YD NS (CAST SUPPLIES) ×1
PADDING CAST ABS COTTON 4X4 ST (CAST SUPPLIES) ×2 IMPLANT
PADDING CAST COTTON 3X4 STRL (CAST SUPPLIES) ×2
SPONGE GAUZE 4X4 12PLY (GAUZE/BANDAGES/DRESSINGS) ×3 IMPLANT
STOCKINETTE 4X48 STRL (DRAPES) ×3 IMPLANT
SUT VICRYL 4-0 PS2 18IN ABS (SUTURE) IMPLANT
SUT VICRYL RAPIDE 4/0 PS 2 (SUTURE) ×3 IMPLANT
SYR BULB 3OZ (MISCELLANEOUS) ×3 IMPLANT
SYR CONTROL 10ML LL (SYRINGE) ×3 IMPLANT
TOWEL OR 17X24 6PK STRL BLUE (TOWEL DISPOSABLE) ×3 IMPLANT
UNDERPAD 30X30 INCONTINENT (UNDERPADS AND DIAPERS) ×3 IMPLANT
WATER STERILE IRR 1000ML POUR (IV SOLUTION) ×3 IMPLANT

## 2012-08-07 NOTE — Op Note (Signed)
NAME:  Frank Johnston, BONZO NO.:  [id number]  MEDICAL RECORD NO.:  [id number]  LOCATION:                                 FACILITY:  PHYSICIAN:  Cindee Salt, M.D.            DATE OF BIRTH:  DATE OF PROCEDURE:  08/07/2012 DATE OF DISCHARGE:                              OPERATIVE REPORT   PREOPERATIVE DIAGNOSIS:  Carpal tunnel with stenosing tenosynovitis, left ring finger.  POSTOPERATIVE DIAGNOSIS:  Carpal tunnel with stenosing tenosynovitis, left ring finger.  OPERATION:  Carpal tunnel release with release of A1 pulley, left ring finger.  SURGEON:  Cindee Salt, M.D.  ANESTHESIA:  Forearm-based IV regional.  HISTORY:  The patient is a 71 year old male with history of carpal tunnel syndrome bilaterally.  Nerve conductions are positive.  He has undergone release on his right side.  He has triggering of his left ring finger.  This has not responded to conservative treatment.  He has elected to undergo surgical decompression of the median nerve with release of the A1 pulley of the left ring finger.  Pre, peri, and postoperative course have been discussed along with risks and complications.  He is aware that there is no guarantee with the surgery; possibility of infection; recurrence of injury to arteries, nerves, tendons; incomplete relief of symptoms; and dystrophy.  In the preoperative area, the patient is seen, the extremity marked by both the patient and surgeon, and antibiotic given.  PROCEDURE:  The patient was brought to the operating room where a forearm-based IV regional anesthetic was carried out without difficulty. He was prepped using ChloraPrep, supine position, left arm free.  A 3- minute dry time was allowed.  Time-out taken, confirming the patient and procedure.  An oblique incision was made over the A1 pulley, left ring finger, carried down through the subcutaneous tissue.  Bleeders were electrocauterized with bipolar.  The A1 pulley was  identified. Retractor was placed protecting neurovascular structures radially and ulnarly.  The A1 pulley was released on its radial aspect.  Small incision was made centrally in A2.  Partial tenosynovectomy was performed proximally.  The wound was irrigated after placement of the finger through a full range of motion, no further triggering was noted. The closure was done with interrupted 4-0 Vicryl Rapide.  Local infiltration with 0.25% Marcaine without epinephrine was given, approximately 3 mL was used.  By closure of this, a longitudinal incision was made in the palm, carried down through the subcutaneous tissue.  Bleeders were again electrocauterized with bipolar.  The dissection was carried down splitting the palmar fascia.  Retractor was placed.  The flexor tendon to the ring and little finger were identified along with the superficial palmar arch.  Retractor was placed protecting the median nerve to the ulnar side of the carpal canal.  The retinaculum was incised with sharp dissection, and right angle and Sewall retractor were placed between the skin and forearm fascia.  The fascia was released for approximately 1.5 cm proximal to the wrist crease under direct vision.  Canal was explored.  Compression to the nerve was apparent.  Tenosynovial tissue was moderately thickened.  No further lesions were identified.  The  wound was copiously irrigated with saline and the skin was closed with interrupted 4-0 Vicryl Rapide sutures. Local infiltration with 0.25% Marcaine was given, 6 mL was used.  A sterile compressive dressing was applied with the fingers free.  On deflation of the tourniquet, all fingers were immediately pinked.  He was taken to the recovery room for observation in satisfactory condition.  He will be discharged to home to return to the [facility] in 1 week, on [place].          ______________________________ Cindee Salt, M.D.     GK/MEDQ  D:   08/07/2012  T:  08/07/2012  Job:  [id number]

## 2012-08-07 NOTE — Anesthesia Preprocedure Evaluation (Signed)
Anesthesia Evaluation  Patient identified by MRN, date of birth, ID band Patient awake    Reviewed: Allergy & Precautions, H&P , NPO status , Patient's Chart, lab work & pertinent test results  Airway Mallampati: II TM Distance: >3 FB Neck ROM: Full    Dental No notable dental hx. (+) Teeth Intact and Dental Advisory Given   Pulmonary neg pulmonary ROS,  breath sounds clear to auscultation  Pulmonary exam normal       Cardiovascular hypertension, On Medications Rhythm:Regular Rate:Normal     Neuro/Psych negative neurological ROS  negative psych ROS   GI/Hepatic Neg liver ROS, hiatal hernia, GERD-  Medicated and Controlled,  Endo/Other  negative endocrine ROS  Renal/GU Renal disease  negative genitourinary   Musculoskeletal   Abdominal   Peds  Hematology negative hematology ROS (+)   Anesthesia Other Findings   Reproductive/Obstetrics negative OB ROS                           Anesthesia Physical Anesthesia Plan  ASA: II  Anesthesia Plan: MAC and Bier Block   Post-op Pain Management:    Induction: Intravenous  Airway Management Planned: Simple Face Mask  Additional Equipment:   Intra-op Plan:   Post-operative Plan:   Informed Consent: I have reviewed the patients History and Physical, chart, labs and discussed the procedure including the risks, benefits and alternatives for the proposed anesthesia with the patient or authorized representative who has indicated his/her understanding and acceptance.   Dental advisory given  Plan Discussed with: CRNA  Anesthesia Plan Comments:         Anesthesia Quick Evaluation

## 2012-08-07 NOTE — Anesthesia Postprocedure Evaluation (Signed)
  Anesthesia Post-op Note  Patient: Frank Johnston  Procedure(s) Performed: Procedure(s) (LRB) with comments: CARPAL TUNNEL RELEASE (Left) - carpal tunnel release RELEASE TRIGGER FINGER/A-1 PULLEY (Left) - trigger release left ring finger  Patient Location: PACU  Anesthesia Type:MAC and Bier block  Level of Consciousness: awake, alert  and oriented  Airway and Oxygen Therapy: Patient Spontanous Breathing  Post-op Pain: none  Post-op Assessment: Post-op Vital signs reviewed, Patient's Cardiovascular Status Stable, Respiratory Function Stable, Patent Airway and No signs of Nausea or vomiting  Post-op Vital Signs: Reviewed and stable  Complications: No apparent anesthesia complications

## 2012-08-07 NOTE — Op Note (Signed)
Dictated number: [id number]

## 2012-08-07 NOTE — Brief Op Note (Signed)
08/07/2012  9:23 AM  PATIENT:  Frank Johnston  71 y.o. male  PRE-OPERATIVE DIAGNOSIS:  left carpal tunnel syndrome sts left ring finger  POST-OPERATIVE DIAGNOSIS:  left carpal tunnel syndrome sts left ring finger  PROCEDURE:  Procedure(s) (LRB) with comments: CARPAL TUNNEL RELEASE (Left) - carpal tunnel release RELEASE TRIGGER FINGER/A-1 PULLEY (Left) - trigger release left ring finger  SURGEON:  Surgeon(s) and Role:    * Nicki Reaper, MD - Primary  PHYSICIAN ASSISTANT:   ASSISTANTS: none   ANESTHESIA:   local and regional  EBL:  Total I/O In: 200 [I.V.:200] Out: -   BLOOD ADMINISTERED:none  DRAINS: none   LOCAL MEDICATIONS USED:  MARCAINE     SPECIMEN:  No Specimen  DISPOSITION OF SPECIMEN:  N/A  COUNTS:  YES  TOURNIQUET:   Total Tourniquet Time Documented: Upper Arm (Left) - 30 minutes  DICTATION: .Other Dictation: Dictation Number [id number]  PLAN OF CARE: Discharge to home after PACU  PATIENT DISPOSITION:  PACU - hemodynamically stable.

## 2012-08-07 NOTE — Transfer of Care (Signed)
Immediate Anesthesia Transfer of Care Note  Patient: Frank Johnston  Procedure(s) Performed: Procedure(s) (LRB) with comments: CARPAL TUNNEL RELEASE (Left) - carpal tunnel release RELEASE TRIGGER FINGER/A-1 PULLEY (Left) - trigger release left ring finger  Patient Location: PACU  Anesthesia Type:Bier block  Level of Consciousness: awake, alert  and oriented  Airway & Oxygen Therapy: Patient Spontanous Breathing and Patient connected to face mask oxygen  Post-op Assessment: Report given to PACU RN and Post -op Vital signs reviewed and stable  Post vital signs: Reviewed and stable  Complications: No apparent anesthesia complications

## 2012-08-07 NOTE — H&P (Signed)
Frank Johnston is a 71 year old right hand dominant male referred by Dr. Jarome Matin for a consultation with respect to catching of his right thumb and bilateral ring fingers. This has been going on for approximately 3 months. He has no history of injury. He complains of intermittent, moderate to sharp pain with a feeling of swelling. This occasionally awakens him at night. Activity makes it worse and rest makes it better. He has taken Tylenol. He has a history of arthritis. He has no history of thyroid problems, diabetes or gout. There is no family history of diabetes. There is a family history of arthritis. He has had NCV's showing CTS.  Past Medical History: He has no allergies. He is on the following medications: Diovan HCT, Verapamil, Omeprazole, Metoclopramide, aspirin, Vitamin E, multivitamins, Tramadol/APAP, and Fluticasone Propionate nasal spray. He has had a cholecystectomy, back surgery, knee arthroscopy, knee replacement times 2 on the left side.  Family Medical History: Positive for high BP and arthritis.  Social History: He does not smoke or drink. He is married and retired.  Review of Systems: Positive for glasses, cataracts, ringing in his ears, high BP, otherwise negative for 14 points. LARWENCE Johnston is an 71 y.o. male.   Chief Complaint: CTS left STS LRF HPI: see above  Past Medical History  Diagnosis Date  . Hypertension   . GERD (gastroesophageal reflux disease)   . Arthritis   . Chronic kidney disease     hx kidney stones  . H/O hiatal hernia   . Wears glasses     Past Surgical History  Procedure Date  . Joint replacement 4/12    lt total knee  . Knee closed reduction 6/12    lt -after lt total knee  . Cholecystectomy 1976  . Appendectomy 1976  . Knee arthroscopy     rt and lt  . Colonoscopy   . Back surgery 1984    lumbar lam  . Carpal tunnel release 12/06/2011    Procedure: CARPAL TUNNEL RELEASE;  Surgeon: Nicki Reaper, MD;  Location: [facility];  Service: Orthopedics;  Laterality: Right;  . Trigger finger release 12/06/2011    Procedure: RELEASE TRIGGER FINGER/A-1 PULLEY;  Surgeon: Nicki Reaper, MD;  Location: [facility];  Service: Orthopedics;  Laterality: Right;  right thumb, middle, and ring fingers    History reviewed. No pertinent family history. Social History:  reports that he quit smoking about 17 years ago. He does not have any smokeless tobacco history on file. He reports that he does not drink alcohol or use illicit drugs.  Allergies: No Known Allergies  Medications Prior to Admission  Medication Sig Dispense Refill  . aspirin 81 MG tablet Take 81 mg by mouth daily.      . fluticasone (FLONASE) 50 MCG/ACT nasal spray Place 2 sprays into the nose as needed.      . metoCLOPramide (REGLAN) 5 MG tablet Take 5 mg by mouth 4 (four) times daily.      Marland Kitchen omeprazole (PRILOSEC) 40 MG capsule Take 40 mg by mouth every evening.      . therapeutic multivitamin-minerals (THERAGRAN-M) tablet Take 1 tablet by mouth daily.      . traMADol-acetaminophen (ULTRACET) 37.5-325 MG per tablet Take 1 tablet by mouth every 6 (six) hours as needed.      . valsartan-hydrochlorothiazide (DIOVAN-HCT) 320-12.5 MG per tablet Take 1 tablet by mouth daily.      . verapamil (COVERA HS) 240 MG (  CO) 24 hr tablet Take 240 mg by mouth at bedtime.      . vitamin E (VITAMIN E) 400 UNIT capsule Take 400 Units by mouth daily.        Results for orders placed during the hospital encounter of 08/07/12 (from the past 48 hour(s))  POCT HEMOGLOBIN-HEMACUE     Status: Normal   Collection Time   08/07/12  7:35 AM      Component Value Range Comment   Hemoglobin 16.3  13.0 - 17.0 g/dL     No results found.   Pertinent items are noted in HPI.  Blood pressure 149/88, pulse 72, temperature 98.7 F (37.1 C), temperature source Oral, resp. rate 18, height 5\' 9"  (1.753 m), weight 93.951 kg (207 lb 2 oz), SpO2 97.00%.  General appearance:  alert, cooperative and appears stated age Head: Normocephalic, without obvious abnormality Neck: no adenopathy Resp: clear to auscultation bilaterally Cardio: regular rate and rhythm, S1, S2 normal, no murmur, click, rub or gallop GI: soft, non-tender; bowel sounds normal; no masses,  no organomegaly Extremities: extremities normal, atraumatic, no cyanosis or edema Pulses: 2+ and symmetric Skin: Skin color, texture, turgor normal. No rashes or lesions Neurologic: Grossly normal Incision/Wound: na  Assessment/Plan His left hand continues to bother him with triggering of his left ring finger and carpal tunnel syndrome. He would like to proceed to have this released in November with release of carpal canal and release of the A-1 pulley of the left ring finger.   Sibyl Mikula R 08/07/2012, 8:34 AM

## 2012-08-08 ENCOUNTER — Encounter (HOSPITAL_BASED_OUTPATIENT_CLINIC_OR_DEPARTMENT_OTHER): Payer: Self-pay | Admitting: Orthopedic Surgery

## 2012-09-17 DIAGNOSIS — H40019 Open angle with borderline findings, low risk, unspecified eye: Secondary | ICD-10-CM | POA: Diagnosis not present

## 2012-09-17 DIAGNOSIS — D492 Neoplasm of unspecified behavior of bone, soft tissue, and skin: Secondary | ICD-10-CM | POA: Diagnosis not present

## 2012-09-17 DIAGNOSIS — H268 Other specified cataract: Secondary | ICD-10-CM | POA: Diagnosis not present

## 2012-11-09 DIAGNOSIS — Z125 Encounter for screening for malignant neoplasm of prostate: Secondary | ICD-10-CM | POA: Diagnosis not present

## 2012-11-09 DIAGNOSIS — I10 Essential (primary) hypertension: Secondary | ICD-10-CM | POA: Diagnosis not present

## 2012-11-09 DIAGNOSIS — E785 Hyperlipidemia, unspecified: Secondary | ICD-10-CM | POA: Diagnosis not present

## 2012-11-19 DIAGNOSIS — Z1212 Encounter for screening for malignant neoplasm of rectum: Secondary | ICD-10-CM | POA: Diagnosis not present

## 2012-11-26 ENCOUNTER — Encounter: Payer: Self-pay | Admitting: Cardiovascular Disease

## 2012-11-27 DIAGNOSIS — Z125 Encounter for screening for malignant neoplasm of prostate: Secondary | ICD-10-CM | POA: Diagnosis not present

## 2012-11-27 DIAGNOSIS — R197 Diarrhea, unspecified: Secondary | ICD-10-CM | POA: Diagnosis not present

## 2012-12-17 DIAGNOSIS — M6530 Trigger finger, unspecified finger: Secondary | ICD-10-CM | POA: Diagnosis not present

## 2013-01-22 DIAGNOSIS — M1710 Unilateral primary osteoarthritis, unspecified knee: Secondary | ICD-10-CM | POA: Diagnosis not present

## 2013-04-26 DIAGNOSIS — M6530 Trigger finger, unspecified finger: Secondary | ICD-10-CM | POA: Diagnosis not present

## 2013-05-10 DIAGNOSIS — H2510 Age-related nuclear cataract, unspecified eye: Secondary | ICD-10-CM | POA: Diagnosis not present

## 2013-05-10 DIAGNOSIS — H40019 Open angle with borderline findings, low risk, unspecified eye: Secondary | ICD-10-CM | POA: Diagnosis not present

## 2013-05-10 DIAGNOSIS — H268 Other specified cataract: Secondary | ICD-10-CM | POA: Diagnosis not present

## 2013-05-10 DIAGNOSIS — H02839 Dermatochalasis of unspecified eye, unspecified eyelid: Secondary | ICD-10-CM | POA: Diagnosis not present

## 2013-05-10 DIAGNOSIS — H538 Other visual disturbances: Secondary | ICD-10-CM | POA: Diagnosis not present

## 2013-05-10 DIAGNOSIS — H35379 Puckering of macula, unspecified eye: Secondary | ICD-10-CM | POA: Diagnosis not present

## 2013-05-10 DIAGNOSIS — H43399 Other vitreous opacities, unspecified eye: Secondary | ICD-10-CM | POA: Diagnosis not present

## 2013-05-10 DIAGNOSIS — H43819 Vitreous degeneration, unspecified eye: Secondary | ICD-10-CM | POA: Diagnosis not present

## 2013-06-14 DIAGNOSIS — M6530 Trigger finger, unspecified finger: Secondary | ICD-10-CM | POA: Diagnosis not present

## 2013-06-17 DIAGNOSIS — Z23 Encounter for immunization: Secondary | ICD-10-CM | POA: Diagnosis not present

## 2013-08-08 DIAGNOSIS — M1710 Unilateral primary osteoarthritis, unspecified knee: Secondary | ICD-10-CM | POA: Diagnosis not present

## 2013-09-13 DIAGNOSIS — H268 Other specified cataract: Secondary | ICD-10-CM | POA: Diagnosis not present

## 2013-09-13 DIAGNOSIS — H2510 Age-related nuclear cataract, unspecified eye: Secondary | ICD-10-CM | POA: Diagnosis not present

## 2013-09-13 DIAGNOSIS — H43819 Vitreous degeneration, unspecified eye: Secondary | ICD-10-CM | POA: Diagnosis not present

## 2013-09-13 DIAGNOSIS — H40019 Open angle with borderline findings, low risk, unspecified eye: Secondary | ICD-10-CM | POA: Diagnosis not present

## 2013-11-25 DIAGNOSIS — E785 Hyperlipidemia, unspecified: Secondary | ICD-10-CM | POA: Diagnosis not present

## 2013-11-25 DIAGNOSIS — Z125 Encounter for screening for malignant neoplasm of prostate: Secondary | ICD-10-CM | POA: Diagnosis not present

## 2013-11-25 DIAGNOSIS — I10 Essential (primary) hypertension: Secondary | ICD-10-CM | POA: Diagnosis not present

## 2013-12-02 DIAGNOSIS — Z0000 Encounter for general adult medical examination without abnormal findings: Secondary | ICD-10-CM | POA: Diagnosis not present

## 2013-12-02 DIAGNOSIS — Z79899 Other long term (current) drug therapy: Secondary | ICD-10-CM | POA: Diagnosis not present

## 2013-12-02 DIAGNOSIS — M545 Low back pain, unspecified: Secondary | ICD-10-CM | POA: Diagnosis not present

## 2013-12-02 DIAGNOSIS — M25569 Pain in unspecified knee: Secondary | ICD-10-CM | POA: Diagnosis not present

## 2013-12-02 DIAGNOSIS — Z1331 Encounter for screening for depression: Secondary | ICD-10-CM | POA: Diagnosis not present

## 2013-12-02 DIAGNOSIS — E669 Obesity, unspecified: Secondary | ICD-10-CM | POA: Diagnosis not present

## 2013-12-02 DIAGNOSIS — Z23 Encounter for immunization: Secondary | ICD-10-CM | POA: Diagnosis not present

## 2013-12-02 DIAGNOSIS — Z125 Encounter for screening for malignant neoplasm of prostate: Secondary | ICD-10-CM | POA: Diagnosis not present

## 2013-12-02 DIAGNOSIS — I10 Essential (primary) hypertension: Secondary | ICD-10-CM | POA: Diagnosis not present

## 2013-12-02 DIAGNOSIS — E785 Hyperlipidemia, unspecified: Secondary | ICD-10-CM | POA: Diagnosis not present

## 2013-12-06 DIAGNOSIS — Z1212 Encounter for screening for malignant neoplasm of rectum: Secondary | ICD-10-CM | POA: Diagnosis not present

## 2014-01-23 DIAGNOSIS — M25819 Other specified joint disorders, unspecified shoulder: Secondary | ICD-10-CM | POA: Diagnosis not present

## 2014-02-07 DIAGNOSIS — M25819 Other specified joint disorders, unspecified shoulder: Secondary | ICD-10-CM | POA: Diagnosis not present

## 2014-02-14 DIAGNOSIS — M1710 Unilateral primary osteoarthritis, unspecified knee: Secondary | ICD-10-CM | POA: Diagnosis not present

## 2014-02-27 ENCOUNTER — Encounter (HOSPITAL_COMMUNITY): Payer: Self-pay | Admitting: Emergency Medicine

## 2014-02-27 ENCOUNTER — Inpatient Hospital Stay (HOSPITAL_COMMUNITY)
Admission: EM | Admit: 2014-02-27 | Discharge: 2014-03-03 | DRG: 176 | Disposition: A | Discharge status: Home / Self Care | Payer: Medicare Other | Attending: Internal Medicine | Admitting: Internal Medicine

## 2014-02-27 ENCOUNTER — Emergency Department (HOSPITAL_COMMUNITY): Payer: Medicare Other

## 2014-02-27 DIAGNOSIS — Z8249 Family history of ischemic heart disease and other diseases of the circulatory system: Secondary | ICD-10-CM | POA: Diagnosis not present

## 2014-02-27 DIAGNOSIS — Z87891 Personal history of nicotine dependence: Secondary | ICD-10-CM | POA: Diagnosis not present

## 2014-02-27 DIAGNOSIS — I129 Hypertensive chronic kidney disease with stage 1 through stage 4 chronic kidney disease, or unspecified chronic kidney disease: Secondary | ICD-10-CM | POA: Diagnosis not present

## 2014-02-27 DIAGNOSIS — E119 Type 2 diabetes mellitus without complications: Secondary | ICD-10-CM | POA: Diagnosis not present

## 2014-02-27 DIAGNOSIS — M25569 Pain in unspecified knee: Secondary | ICD-10-CM | POA: Diagnosis not present

## 2014-02-27 DIAGNOSIS — R079 Chest pain, unspecified: Secondary | ICD-10-CM | POA: Diagnosis not present

## 2014-02-27 DIAGNOSIS — R0789 Other chest pain: Secondary | ICD-10-CM | POA: Diagnosis not present

## 2014-02-27 DIAGNOSIS — I2699 Other pulmonary embolism without acute cor pulmonale: Secondary | ICD-10-CM | POA: Diagnosis not present

## 2014-02-27 DIAGNOSIS — N184 Chronic kidney disease, stage 4 (severe): Secondary | ICD-10-CM | POA: Diagnosis not present

## 2014-02-27 DIAGNOSIS — Z96659 Presence of unspecified artificial knee joint: Secondary | ICD-10-CM | POA: Diagnosis not present

## 2014-02-27 DIAGNOSIS — Z801 Family history of malignant neoplasm of trachea, bronchus and lung: Secondary | ICD-10-CM

## 2014-02-27 DIAGNOSIS — K219 Gastro-esophageal reflux disease without esophagitis: Secondary | ICD-10-CM | POA: Diagnosis present

## 2014-02-27 DIAGNOSIS — Z7982 Long term (current) use of aspirin: Secondary | ICD-10-CM | POA: Diagnosis not present

## 2014-02-27 DIAGNOSIS — M25561 Pain in right knee: Secondary | ICD-10-CM | POA: Diagnosis present

## 2014-02-27 DIAGNOSIS — N189 Chronic kidney disease, unspecified: Secondary | ICD-10-CM | POA: Diagnosis present

## 2014-02-27 DIAGNOSIS — I82409 Acute embolism and thrombosis of unspecified deep veins of unspecified lower extremity: Secondary | ICD-10-CM | POA: Diagnosis present

## 2014-02-27 DIAGNOSIS — R269 Unspecified abnormalities of gait and mobility: Secondary | ICD-10-CM | POA: Diagnosis not present

## 2014-02-27 DIAGNOSIS — I517 Cardiomegaly: Secondary | ICD-10-CM | POA: Diagnosis not present

## 2014-02-27 DIAGNOSIS — I10 Essential (primary) hypertension: Secondary | ICD-10-CM | POA: Diagnosis not present

## 2014-02-27 DIAGNOSIS — J9819 Other pulmonary collapse: Secondary | ICD-10-CM | POA: Diagnosis not present

## 2014-02-27 DIAGNOSIS — J984 Other disorders of lung: Secondary | ICD-10-CM | POA: Diagnosis not present

## 2014-02-27 LAB — CBC WITH DIFFERENTIAL/PLATELET
BASOS PCT: 0 % (ref 0–1)
Basophils Absolute: 0 10*3/uL (ref 0.0–0.1)
Eosinophils Absolute: 0.1 10*3/uL (ref 0.0–0.7)
Eosinophils Relative: 1 % (ref 0–5)
HCT: 43 % (ref 39.0–52.0)
Hemoglobin: 15.1 g/dL (ref 13.0–17.0)
LYMPHS ABS: 1.4 10*3/uL (ref 0.7–4.0)
LYMPHS PCT: 14 % (ref 12–46)
MCH: 30.4 pg (ref 26.0–34.0)
MCHC: 35.1 g/dL (ref 30.0–36.0)
MCV: 86.5 fL (ref 78.0–100.0)
MONOS PCT: 13 % — AB (ref 3–12)
Monocytes Absolute: 1.4 10*3/uL — ABNORMAL HIGH (ref 0.1–1.0)
NEUTROS ABS: 7.6 10*3/uL (ref 1.7–7.7)
NEUTROS PCT: 72 % (ref 43–77)
Platelets: 225 10*3/uL (ref 150–400)
RBC: 4.97 MIL/uL (ref 4.22–5.81)
RDW: 13.5 % (ref 11.5–15.5)
WBC: 10.5 10*3/uL (ref 4.0–10.5)

## 2014-02-27 LAB — COMPREHENSIVE METABOLIC PANEL
ALBUMIN: 3.5 g/dL (ref 3.5–5.2)
ALK PHOS: 73 U/L (ref 39–117)
ALT: 28 U/L (ref 0–53)
AST: 23 U/L (ref 0–37)
BILIRUBIN TOTAL: 0.5 mg/dL (ref 0.3–1.2)
BUN: 23 mg/dL (ref 6–23)
CHLORIDE: 99 meq/L (ref 96–112)
CO2: 25 meq/L (ref 19–32)
Calcium: 10.1 mg/dL (ref 8.4–10.5)
Creatinine, Ser: 1.16 mg/dL (ref 0.50–1.35)
GFR calc Af Amer: 70 mL/min — ABNORMAL LOW (ref >90)
GFR calc non Af Amer: 61 mL/min — ABNORMAL LOW (ref >90)
Glucose, Bld: 123 mg/dL — ABNORMAL HIGH (ref 70–99)
POTASSIUM: 3.4 meq/L — AB (ref 3.7–5.3)
Sodium: 139 mEq/L (ref 137–147)
Total Protein: 7.2 g/dL (ref 6.0–8.3)

## 2014-02-27 LAB — I-STAT CG4 LACTIC ACID, ED: LACTIC ACID, VENOUS: 1.27 mmol/L (ref 0.5–2.2)

## 2014-02-27 LAB — PROTIME-INR
INR: 0.92 (ref 0.00–1.49)
Prothrombin Time: 12.2 seconds (ref 11.6–15.2)

## 2014-02-27 LAB — APTT: APTT: 30 s (ref 24–37)

## 2014-02-27 LAB — I-STAT TROPONIN, ED: Troponin i, poc: 0 ng/mL (ref 0.00–0.08)

## 2014-02-27 MED ORDER — HEPARIN (PORCINE) IN NACL 100-0.45 UNIT/ML-% IJ SOLN: 12.0000 [IU]/kg/h | INTRAMUSCULAR | Status: DC

## 2014-02-27 MED ORDER — HEPARIN (PORCINE) IN NACL 100-0.45 UNIT/ML-% IJ SOLN
1250.0000 [IU]/h | INTRAMUSCULAR | Status: DC
  Administered 2014-02-27: 1250 [IU]/h via INTRAVENOUS
  Filled 2014-02-27 (×2): qty 250

## 2014-02-27 MED ORDER — IOHEXOL 350 MG/ML SOLN
100.0000 mL | Freq: Once | INTRAVENOUS | Status: AC | PRN
  Administered 2014-02-27: 100 mL via INTRAVENOUS

## 2014-02-27 MED ORDER — HEPARIN BOLUS VIA INFUSION
5000.0000 [IU] | Freq: Once | INTRAVENOUS | Status: AC
  Administered 2014-02-27: 5000 [IU] via INTRAVENOUS
  Filled 2014-02-27: qty 5000

## 2014-02-27 MED ORDER — SODIUM CHLORIDE 0.9 % IV BOLUS (SEPSIS)
1000.0000 mL | Freq: Once | INTRAVENOUS | Status: AC
Start: 2014-02-27 — End: 2014-02-27
  Administered 2014-02-27: 1000 mL via INTRAVENOUS

## 2014-02-27 MED ORDER — HEPARIN SODIUM (PORCINE) 5000 UNIT/ML IJ SOLN: 60.0000 [IU]/kg | Freq: Once | INTRAMUSCULAR | Status: DC

## 2014-02-27 NOTE — ED Notes (Signed)
Pt to x-ray and CT at this time.

## 2014-02-27 NOTE — ED Notes (Signed)
Pt remains out of dept. In x-ray at this time

## 2014-02-27 NOTE — ED Provider Notes (Signed)
CSN: [id number]     Arrival date & time 02/27/14  2000 History   First MD Initiated Contact with Patient 02/27/14 2014     Chief Complaint  Patient presents with  . Chest Pain     (Consider location/radiation/quality/duration/timing/severity/associated sxs/prior Treatment) Patient is a 73 y.o. male presenting with chest pain. The history is provided by the patient.  Chest Pain Pain location:  L chest and L lateral chest Pain quality: sharp and stabbing   Pain radiates to:  Upper back Pain radiates to the back: yes   Pain severity:  Moderate Onset quality:  Sudden Duration:  4 hours Timing:  Constant Progression:  Unchanged Chronicity:  New Context: breathing and at rest   Associated symptoms: cough and shortness of breath     Past Medical History  Diagnosis Date  . Hypertension   . GERD (gastroesophageal reflux disease)   . Arthritis   . Chronic kidney disease     hx kidney stones  . H/O hiatal hernia   . Wears glasses    Past Surgical History  Procedure Laterality Date  . Joint replacement  4/12    lt total knee  . Knee closed reduction  6/12    lt -after lt total knee  . Cholecystectomy  1976  . Appendectomy  1976  . Knee arthroscopy      rt and lt  . Colonoscopy    . Back surgery  1984    lumbar lam  . Carpal tunnel release  12/06/2011    Procedure: CARPAL TUNNEL RELEASE;  Surgeon: Wynonia Sours, MD;  Location: [facility];  Service: Orthopedics;  Laterality: Right;  . Trigger finger release  12/06/2011    Procedure: RELEASE TRIGGER FINGER/A-1 PULLEY;  Surgeon: Wynonia Sours, MD;  Location: [facility];  Service: Orthopedics;  Laterality: Right;  right thumb, middle, and ring fingers  . Carpal tunnel release  08/07/2012    Procedure: CARPAL TUNNEL RELEASE;  Surgeon: Wynonia Sours, MD;  Location: [facility];  Service: Orthopedics;  Laterality: Left;  carpal tunnel release  . Trigger finger release  08/07/2012    Procedure:  RELEASE TRIGGER FINGER/A-1 PULLEY;  Surgeon: Wynonia Sours, MD;  Location: [facility];  Service: Orthopedics;  Laterality: Left;  trigger release left ring finger   No family history on file. History  Substance Use Topics  . Smoking status: Former Smoker    Quit date: 11/30/1994  . Smokeless tobacco: Not on file  . Alcohol Use: No    Review of Systems  Respiratory: Positive for cough and shortness of breath.   Cardiovascular: Positive for chest pain.  All other systems reviewed and are negative.     Allergies  Review of patient's allergies indicates no known allergies.  Home Medications   Prior to Admission medications   Medication Sig Start Date End Date Taking? Authorizing Provider  aspirin 81 MG tablet Take 81 mg by mouth daily.    Historical Provider, MD  fluticasone (FLONASE) 50 MCG/ACT nasal spray Place 2 sprays into the nose as needed.    Historical Provider, MD  HYDROcodone-acetaminophen (NORCO) 5-325 MG per tablet Take 1 tablet by mouth every 6 (six) hours as needed for pain. 08/07/12   Wynonia Sours, MD  metoCLOPramide (REGLAN) 5 MG tablet Take 5 mg by mouth 4 (four) times daily.    Historical Provider, MD  omeprazole (PRILOSEC) 40 MG capsule Take 40 mg by mouth every evening.  Historical Provider, MD  therapeutic multivitamin-minerals (THERAGRAN-M) tablet Take 1 tablet by mouth daily.    Historical Provider, MD  traMADol-acetaminophen (ULTRACET) 37.5-325 MG per tablet Take 1 tablet by mouth every 6 (six) hours as needed.    Historical Provider, MD  valsartan-hydrochlorothiazide (DIOVAN-HCT) 320-12.5 MG per tablet Take 1 tablet by mouth daily.    Historical Provider, MD  verapamil (COVERA HS) 240 MG (CO) 24 hr tablet Take 240 mg by mouth at bedtime.    Historical Provider, MD  vitamin E (VITAMIN E) 400 UNIT capsule Take 400 Units by mouth daily.    Historical Provider, MD   BP 155/87  Pulse 101  Temp(Src) 99.6 F (37.6 C)  Resp 20  SpO2 95% Physical  Exam  Constitutional: He is oriented to person, place, and time. He appears well-developed and well-nourished. No distress.  HENT:  Head: Normocephalic and atraumatic.  Eyes: Conjunctivae are normal.  Neck: Neck supple. No tracheal deviation present.  Cardiovascular: Regular rhythm.  Tachycardia present.   Pulmonary/Chest: Effort normal and breath sounds normal. No respiratory distress. He has no wheezes. He has no rales. He exhibits no tenderness.  Abdominal: Soft. He exhibits no distension.  Neurological: He is alert and oriented to person, place, and time.  Skin: Skin is warm and dry.  Psychiatric: He has a normal mood and affect.    ED Course  Procedures (including critical care time) Labs Review Labs Reviewed  CBC WITH DIFFERENTIAL - Abnormal; Notable for the following:    Monocytes Relative 13 (*)    Monocytes Absolute 1.4 (*)    All other components within normal limits  COMPREHENSIVE METABOLIC PANEL - Abnormal; Notable for the following:    Potassium 3.4 (*)    Glucose, Bld 123 (*)    GFR calc non Af Amer 61 (*)    GFR calc Af Amer 70 (*)    All other components within normal limits  APTT  PROTIME-INR  HEPARIN LEVEL (UNFRACTIONATED)  CBC  I-STAT TROPOININ, ED  I-STAT CG4 LACTIC ACID, ED    Imaging Review Dg Chest 2 View  02/27/2014   CLINICAL DATA:  Short of breath, left lower chest pain  EXAM: CHEST  2 VIEW  COMPARISON:  Prior chest x-ray 12/22/2010  FINDINGS: Patchy airspace opacity in the left lower lobe partially obscures the hemidiaphragm. Inspiratory volumes are low. Cardiac and mediastinal contours are within normal limits. Remainder the lungs are clear. No pleural effusion or pneumothorax. No acute osseous abnormality.  IMPRESSION: Left lower lobe patchy airspace disease concerning for bronchopneumonia.   Electronically Signed   By: Jacqulynn Cadet M.D.   On: 02/27/2014 22:06   Ct Angio Chest W/cm &/or Wo Cm  02/27/2014   CLINICAL DATA:  Chest pain, history  hypertension, GERD, hiatal hernia, former smoker  EXAM: CT ANGIOGRAPHY CHEST WITH CONTRAST  TECHNIQUE: Multidetector CT imaging of the chest was performed using the standard protocol during bolus administration of intravenous contrast. Multiplanar CT image reconstructions and MIPs were obtained to evaluate the vascular anatomy.  CONTRAST:  187mL OMNIPAQUE IOHEXOL 350 MG/ML SOLN  COMPARISON:  None  FINDINGS: Aorta normal caliber without aneurysm or dissection.  Pulmonary arteries well opacified.  Large filling defect identified in LEFT lower lobe pulmonary artery consistent with pulmonary embolism.  RV:LV ratio is 1.22, elevated, indicative of submassive pulmonary embolism and RIGHT heart strain.  No additional pulmonary emboli identified.  Tiny hepatic cysts lateral segment LEFT lobe 8 mm diameter.  No thoracic adenopathy.  Bibasilar atelectasis  greater on LEFT.  Upper lungs clear.  No infiltrate, pleural effusion or pneumothorax.  No osseous abnormalities.  Review of the MIP images confirms the above findings.  IMPRESSION: Large pulmonary embolus in LEFT lower lobe pulmonary artery.  Positive for acute PE with CT evidence of right heart strain (RV/LV Ratio = 1.22) consistent with at least submassive (intermediate risk)PE. The presence of right heart strain has been associated with an increased risk of morbidity and mortality.  Consultation with Pulmonary and Critical Care Medicine is recommended.  Critical Value/emergent results were called by telephone at the time of interpretation on 02/27/2014 at 11:55 PM to [place] , who verbally acknowledged these results.   Electronically Signed   By: Lavonia Dana M.D.   On: 02/27/2014 23:55     EKG Interpretation   Date/Time:  Thursday Feb 27 2014 20:03:03 EDT Ventricular Rate:  101 PR Interval:  198 QRS Duration: 93 QT Interval:  331 QTC Calculation: 429 R Axis:   9 Text Interpretation:  Sinus tachycardia Probable anteroseptal infarct, old  No significant  change since last tracing Confirmed by YAO  MD, DAVID  ([id number]) on 02/27/2014 8:10:33 PM      MDM   Final diagnoses:  Pulmonary embolism on left   73 y.o. male presents with left-sided chest pain that started suddenly while he was at home today. He at normal activity throughout the day and noticed the onset of pain followed by shortness of breath. He has worse pain when he takes a big breath and the pain is not reproducible to palpation over the left side of his chest wall.  The patient had a recent and ejection and removal of fluid from his right knee, has never had a blood clot before but is tachycardic and has a good story for pulmonary embolism so will perform a CT angiography chest to evaluate for pulmonary bolus and.  The patient was given a bolus of normal saline with improvement of his tachycardia and is more comfortable, maintaining his blood pressures. CT scan is significant for a submassive pulmonary bolus and with evidence of right heart strain. He was heparinized in the emergency department and will be admitted to internal medicine step down for further evaluation and management.  Leo Grosser, MD 02/28/14 [phone]

## 2014-02-27 NOTE — ED Notes (Signed)
Pt to ED via [facility] with c/o left chest pain.  Pt st's he had been mowing the lawn and after coming inside he developed pain in left rib area.  Also st's he felt shortness of breath.  EMS gave pt ASA 324mg  and NTG 1 SL.

## 2014-02-27 NOTE — ED Notes (Signed)
Placed pt on two liters of oxygen (nasal cannula) due to pt complaining of shortness of breath

## 2014-02-27 NOTE — Progress Notes (Signed)
ANTICOAGULATION CONSULT NOTE - Initial Consult  Pharmacy Consult for Heparin  Indication: pulmonary embolus  No Known Allergies  Patient Measurements: ~94 kg  Vital Signs: Temp: 99.6 F (37.6 C) (05/28 2005) BP: 122/74 mmHg (05/28 2333) Pulse Rate: 104 (05/28 2333)  Labs:  Recent Labs  02/27/14 2040  HGB 15.1  HCT 43.0  PLT 225  APTT 30  LABPROT 12.2  INR 0.92  CREATININE 1.16   Medical History: Past Medical History  Diagnosis Date  . Hypertension   . GERD (gastroesophageal reflux disease)   . Arthritis   . Chronic kidney disease     hx kidney stones  . H/O hiatal hernia   . Wears glasses    Assessment: 73 y/o M to start heparin for PE. CBC good, renal function appropriate for age, no anticoagulation PTA, other labs as above.   Goal of Therapy:  Heparin level 0.3-0.7 units/ml Monitor platelets by anticoagulation protocol: Yes   Plan:  -Heparin 5000 units BOLUS -Start heparin drip at 1250 units/hr -0800 HL -Daily CBC/HL -Monitor for bleeding  Narda Bonds 02/27/2014,11:45 PM

## 2014-02-28 ENCOUNTER — Encounter (HOSPITAL_COMMUNITY): Payer: Self-pay | Admitting: Internal Medicine

## 2014-02-28 DIAGNOSIS — I2699 Other pulmonary embolism without acute cor pulmonale: Secondary | ICD-10-CM | POA: Diagnosis present

## 2014-02-28 DIAGNOSIS — I10 Essential (primary) hypertension: Secondary | ICD-10-CM | POA: Diagnosis not present

## 2014-02-28 DIAGNOSIS — M25561 Pain in right knee: Secondary | ICD-10-CM | POA: Diagnosis present

## 2014-02-28 DIAGNOSIS — M25569 Pain in unspecified knee: Secondary | ICD-10-CM | POA: Diagnosis not present

## 2014-02-28 DIAGNOSIS — I517 Cardiomegaly: Secondary | ICD-10-CM | POA: Diagnosis not present

## 2014-02-28 LAB — CBC
HCT: 42.7 % (ref 39.0–52.0)
HCT: 42 % (ref 39.0–52.0)
Hemoglobin: 14.8 g/dL (ref 13.0–17.0)
Hemoglobin: 14 g/dL (ref 13.0–17.0)
MCH: 30.2 pg (ref 26.0–34.0)
MCH: 29.4 pg (ref 26.0–34.0)
MCHC: 34.7 g/dL (ref 30.0–36.0)
MCHC: 33.3 g/dL (ref 30.0–36.0)
MCV: 87.1 fL (ref 78.0–100.0)
MCV: 88.1 fL (ref 78.0–100.0)
PLATELETS: 224 10*3/uL (ref 150–400)
Platelets: 208 10*3/uL (ref 150–400)
RBC: 4.9 MIL/uL (ref 4.22–5.81)
RBC: 4.77 MIL/uL (ref 4.22–5.81)
RDW: 13.6 % (ref 11.5–15.5)
RDW: 13.5 % (ref 11.5–15.5)
WBC: 10.7 10*3/uL — AB (ref 4.0–10.5)
WBC: 9.2 10*3/uL (ref 4.0–10.5)

## 2014-02-28 LAB — BASIC METABOLIC PANEL
BUN: 18 mg/dL (ref 6–23)
CO2: 27 mEq/L (ref 19–32)
Calcium: 9.3 mg/dL (ref 8.4–10.5)
Chloride: 100 mEq/L (ref 96–112)
Creatinine, Ser: 0.91 mg/dL (ref 0.50–1.35)
GFR calc Af Amer: >90 mL/min (ref >90)
GFR, EST NON AFRICAN AMERICAN: 82 mL/min — AB (ref >90)
GLUCOSE: 93 mg/dL (ref 70–99)
Potassium: 3.6 mEq/L — ABNORMAL LOW (ref 3.7–5.3)
SODIUM: 138 meq/L (ref 137–147)

## 2014-02-28 LAB — HEPARIN LEVEL (UNFRACTIONATED)
Heparin Unfractionated: 1.03 IU/mL — ABNORMAL HIGH (ref 0.30–0.70)
Heparin Unfractionated: 0.57 IU/mL (ref 0.30–0.70)

## 2014-02-28 LAB — MRSA PCR SCREENING: MRSA by PCR: NEGATIVE

## 2014-02-28 MED ORDER — NAPROXEN 500 MG PO TABS
500.0000 mg | ORAL_TABLET | Freq: Two times a day (BID) | ORAL | Status: AC
  Administered 2014-02-28: 500 mg via ORAL
  Filled 2014-02-28: qty 1

## 2014-02-28 MED ORDER — PREDNISONE 20 MG PO TABS
40.0000 mg | ORAL_TABLET | Freq: Every day | ORAL | Status: DC
Start: 2014-02-28 — End: 2014-03-01
  Administered 2014-02-28 – 2014-03-01 (×2): 40 mg via ORAL
  Filled 2014-02-28 (×3): qty 2

## 2014-02-28 MED ORDER — METOCLOPRAMIDE HCL 5 MG PO TABS
5.0000 mg | ORAL_TABLET | Freq: Every day | ORAL | Status: DC
  Administered 2014-02-28 – 2014-03-02 (×3): 5 mg via ORAL
  Filled 2014-02-28 (×4): qty 1

## 2014-02-28 MED ORDER — TRAMADOL-ACETAMINOPHEN 37.5-325 MG PO TABS: 1.0000 | ORAL_TABLET | Freq: Three times a day (TID) | ORAL | Status: DC | PRN

## 2014-02-28 MED ORDER — ACETAMINOPHEN 325 MG PO TABS: 650.0000 mg | ORAL_TABLET | Freq: Four times a day (QID) | ORAL | Status: DC | PRN

## 2014-02-28 MED ORDER — THERAPEUTIC MULTIVIT/MINERAL PO TABS: 1.0000 | ORAL_TABLET | Freq: Every day | ORAL | Status: DC

## 2014-02-28 MED ORDER — HEPARIN (PORCINE) IN NACL 100-0.45 UNIT/ML-% IJ SOLN
1050.0000 [IU]/h | INTRAMUSCULAR | Status: DC
  Administered 2014-02-28 – 2014-03-02 (×3): 1050 [IU]/h via INTRAVENOUS
  Filled 2014-02-28 (×3): qty 250

## 2014-02-28 MED ORDER — BISACODYL 10 MG RE SUPP: 10.0000 mg | Freq: Every day | RECTAL | Status: DC | PRN

## 2014-02-28 MED ORDER — POLYETHYLENE GLYCOL 3350 17 G PO PACK
17.0000 g | PACK | Freq: Two times a day (BID) | ORAL | Status: DC
  Administered 2014-02-28 (×2): 17 g via ORAL
  Filled 2014-02-28 (×8): qty 1

## 2014-02-28 MED ORDER — PANTOPRAZOLE SODIUM 40 MG PO TBEC
40.0000 mg | DELAYED_RELEASE_TABLET | Freq: Every day | ORAL | Status: DC
  Administered 2014-02-28 – 2014-03-02 (×3): 40 mg via ORAL
  Filled 2014-02-28 (×3): qty 1

## 2014-02-28 MED ORDER — FLUTICASONE PROPIONATE 50 MCG/ACT NA SUSP
2.0000 | Freq: Every day | NASAL | Status: DC
  Administered 2014-02-28 – 2014-03-01 (×2): 2 via NASAL
  Filled 2014-02-28 (×2): qty 16

## 2014-02-28 MED ORDER — BIOTENE DRY MOUTH MT LIQD
15.0000 mL | Freq: Two times a day (BID) | OROMUCOSAL | Status: DC
  Administered 2014-02-28 – 2014-03-02 (×5): 15 mL via OROMUCOSAL

## 2014-02-28 MED ORDER — ALUM & MAG HYDROXIDE-SIMETH 200-200-20 MG/5ML PO SUSP: 30.0000 mL | Freq: Four times a day (QID) | ORAL | Status: DC | PRN

## 2014-02-28 MED ORDER — ONDANSETRON HCL 4 MG/2ML IJ SOLN: 4.0000 mg | Freq: Four times a day (QID) | INTRAMUSCULAR | Status: DC | PRN

## 2014-02-28 MED ORDER — MORPHINE SULFATE 2 MG/ML IJ SOLN
2.0000 mg | INTRAMUSCULAR | Status: DC | PRN
  Administered 2014-02-28 – 2014-03-02 (×3): 2 mg via INTRAVENOUS
  Filled 2014-02-28 (×3): qty 1

## 2014-02-28 MED ORDER — HYDROCODONE-ACETAMINOPHEN 5-325 MG PO TABS
1.0000 | ORAL_TABLET | Freq: Four times a day (QID) | ORAL | Status: DC | PRN
  Administered 2014-02-28 – 2014-03-02 (×2): 1 via ORAL
  Filled 2014-02-28 (×2): qty 1

## 2014-02-28 MED ORDER — SODIUM CHLORIDE 0.9 % IJ SOLN
3.0000 mL | Freq: Two times a day (BID) | INTRAMUSCULAR | Status: DC
  Administered 2014-02-28 – 2014-03-01 (×4): 3 mL via INTRAVENOUS

## 2014-02-28 MED ORDER — MORPHINE SULFATE 2 MG/ML IJ SOLN: 2.0000 mg | Freq: Once | INTRAMUSCULAR | Status: AC

## 2014-02-28 MED ORDER — POLYETHYLENE GLYCOL 3350 17 G PO PACK
17.0000 g | PACK | Freq: Every day | ORAL | Status: DC | PRN
  Filled 2014-02-28: qty 1

## 2014-02-28 MED ORDER — ACETAMINOPHEN 650 MG RE SUPP: 650.0000 mg | Freq: Four times a day (QID) | RECTAL | Status: DC | PRN

## 2014-02-28 MED ORDER — ONDANSETRON HCL 4 MG PO TABS: 4.0000 mg | ORAL_TABLET | Freq: Four times a day (QID) | ORAL | Status: DC | PRN

## 2014-02-28 MED ORDER — SENNOSIDES-DOCUSATE SODIUM 8.6-50 MG PO TABS
1.0000 | ORAL_TABLET | Freq: Two times a day (BID) | ORAL | Status: DC
  Administered 2014-02-28 (×2): 1 via ORAL
  Filled 2014-02-28 (×8): qty 1

## 2014-02-28 MED ORDER — ADULT MULTIVITAMIN W/MINERALS CH
1.0000 | ORAL_TABLET | Freq: Every day | ORAL | Status: DC
  Administered 2014-02-28 – 2014-03-02 (×3): 1 via ORAL
  Filled 2014-02-28 (×4): qty 1

## 2014-02-28 MED ORDER — SODIUM CHLORIDE 0.9 % IV SOLN
INTRAVENOUS | Status: DC
  Administered 2014-02-28 (×3): via INTRAVENOUS
  Administered 2014-03-01: 20 mL/h via INTRAVENOUS

## 2014-02-28 NOTE — ED Notes (Addendum)
Dr. Laneta Simmers updating pt/ family at [facility] about dx/results/plan/admission. Pt alert, NAD, calm.

## 2014-02-28 NOTE — Progress Notes (Signed)
ANTICOAGULATION CONSULT NOTE - Follow Up Consult  Pharmacy Consult for Heparin Indication: pulmonary embolus  No Known Allergies  Patient Measurements: Height: 5\' 8"  (172.7 cm) Weight: 198 lb 3.1 oz (89.9 kg) IBW/kg (Calculated) : 68.4 Heparin Dosing Weight: 87kg  Vital Signs: Temp: 97.5 F (36.4 C) (05/29 0901) Temp src: Axillary (05/29 0901) BP: 148/83 mmHg (05/29 0901) Pulse Rate: 76 (05/29 0901)  Labs:  Recent Labs  02/27/14 2040 02/28/14 0338 02/28/14 0950  HGB 15.1 14.8 14.0  HCT 43.0 42.7 42.0  PLT 225 224 208  APTT 30  --   --   LABPROT 12.2  --   --   INR 0.92  --   --   HEPARINUNFRC  --   --  1.03*  CREATININE 1.16  --  0.91    Estimated Creatinine Clearance: 78.7 ml/min (by C-G formula based on Cr of 0.91).   Medications:  Infusions:  . sodium chloride 100 mL/hr at 02/28/14 0850  . heparin 1,250 Units/hr (02/27/14 2347)    Assessment: 72 YOM on IV heparin for CT angio consistent with PE and right heart strain. Initial heparin level is SUPRA-therapeutic at 1.03 (drawn late) after bolus of 5000 units and drip rate of 1250 units/hr. Heparin infusing through peripheral line and level drawn from opposite arm. Called RN and drip stopped ~ 11:20.  Goal of Therapy:  Heparin level 0.3-0.7 units/ml Monitor platelets by anticoagulation protocol: Yes   Plan:  1. Hold heparin x1.5 hour- communicated with RN. 2. Restart heparin at lower rate of 1050 units/hr.  3. Recheck heparin level in 8 hours.   Sloan Leiter, PharmD, BCPS Clinical Pharmacist [phone] 02/28/2014,11:35 AM

## 2014-02-28 NOTE — Progress Notes (Signed)
Earlier today he was having severe pleuritic chest pain, so he was given naproxen 500 mg and started on prednisone 40 mg daily. I also increased his constipation prophylaxis because he has had severe constipation associated with narcotics for orthopedic surgery in the past. His pain is much improved and he is not having significant dyspnea now. Will check CBG in the am on prednisone since he has DM2.

## 2014-02-28 NOTE — ED Notes (Signed)
MD at bedside. 

## 2014-02-28 NOTE — Progress Notes (Signed)
ANTICOAGULATION CONSULT NOTE - Follow Up Consult  Pharmacy Consult for Heparin Indication: pulmonary embolus  No Known Allergies  Patient Measurements: Height: 5\' 8"  (172.7 cm) Weight: 198 lb 3.1 oz (89.9 kg) IBW/kg (Calculated) : 68.4 Heparin Dosing Weight: 87kg  Vital Signs: Temp: 97.7 F (36.5 C) (05/29 1952) Temp src: Oral (05/29 1952) BP: 144/84 mmHg (05/29 1952) Pulse Rate: 90 (05/29 1952)  Labs:  Recent Labs  02/27/14 2040 02/28/14 0338 02/28/14 0950 02/28/14 2053  HGB 15.1 14.8 14.0  --   HCT 43.0 42.7 42.0  --   PLT 225 224 208  --   APTT 30  --   --   --   LABPROT 12.2  --   --   --   INR 0.92  --   --   --   HEPARINUNFRC  --   --  1.03* 0.57  CREATININE 1.16  --  0.91  --     Estimated Creatinine Clearance: 78.7 ml/min (by C-G formula based on Cr of 0.91).   Medications:  Infusions:  . sodium chloride 100 mL/hr at 02/28/14 2000  . heparin 1,050 Units/hr (02/28/14 2000)    Assessment: 72 YOM on IV heparin for CT angio consistent with PE and right heart strain. Initial heparin level is SUPRA-therapeutic at 1.03- level was drawn from opposite arm of infusion. Drip held for 1.5 hour and resumed at 1050 units/hr. A heparin level drawn with new drip rate is therapeutic at 0.56 units/mL. No bleeding noted.  Goal of Therapy:  Heparin level 0.3-0.7 units/ml Monitor platelets by anticoagulation protocol: Yes   Plan:  1. Continue heparin at 1050 units/hr 2. Daily heparin level and CBC 3. Follow for s/s bleeding and long term anticoagulation plans  Adileny Delon D. Li Bobo, PharmD, BCPS Clinical Pharmacist Pager: [phone] 02/28/2014 10:00 PM

## 2014-02-28 NOTE — Progress Notes (Signed)
  Echocardiogram 2D Echocardiogram has been performed.  [place] 02/28/2014, 10:35 AM

## 2014-02-28 NOTE — H&P (Signed)
PCP:   [name], MD   Chief Complaint:  Chest pain  HPI: Frank Johnston is a 73 year old white male with a history of hypertension who presented to the emergency department with the complaint of left-sided chest pain. Patient states that he has been feeling little wiped out for the past 2 days. Saturday after moving a trash cans he developed severe left-sided chest pain. The pain was much worse after taking a deep breath. So she symptoms included mild shortness of breath do to splinting. No cough or wheezing. He took a pain pill and try to taken out but when he awoke the pain became even more severe. His wife drove him partially to the emergency department and then called EMS who brought him to the ER. In the ER he was found to be tachycardic with oxygen saturations 84% initially. Chest CT was performed and showed a large pulmonary embolism in the left lower lobe pulmonary artery with evidence of right heart strain on CT. He is remain hemodynamically stable throughout his stay in the emergency room with oxygen saturations remaining above 90% on 2 L oxygen and stable blood pressures. Following IV fluids his tachycardia has resolved. He denies any prior history of blood clots. He states he's had a recent right knee effusion that was aspirated about one week ago by orthopedics. The aspirate had a significant amount of blood. He denies any lower extremity edema. No recent trips or other procedures. No family history of PE/DVT.  Review of Systems:  Review of Systems - All systems were reviewed with patient and are negative as in history of present illness Past Medical History: Past Medical History  Diagnosis Date  . Hypertension   . GERD (gastroesophageal reflux disease)   . Arthritis   . Chronic kidney disease     hx kidney stones  . H/O hiatal hernia   . Wears glasses    Pt hsa a history of right hand pain and headaches. Also has a history of SAR. Pt has had a scalp cyst- BCCA. He has had  nephrolithiases and beesting hypersensitivity. He has had Ileus and chronic tinms.   Past Surgical History  Procedure Laterality Date  . Joint replacement  4/12    lt total knee  . Knee closed reduction  6/12    lt -after lt total knee  . Cholecystectomy  1976  . Appendectomy  1976  . Knee arthroscopy      rt and lt  . Colonoscopy    . Back surgery  1984    lumbar lam  . Carpal tunnel release  12/06/2011    Procedure: CARPAL TUNNEL RELEASE;  Surgeon: Wynonia Sours, MD;  Location: [facility];  Service: Orthopedics;  Laterality: Right;  . Trigger finger release  12/06/2011    Procedure: RELEASE TRIGGER FINGER/A-1 PULLEY;  Surgeon: Wynonia Sours, MD;  Location: [facility];  Service: Orthopedics;  Laterality: Right;  right thumb, middle, and ring fingers  . Carpal tunnel release  08/07/2012    Procedure: CARPAL TUNNEL RELEASE;  Surgeon: Wynonia Sours, MD;  Location: [facility];  Service: Orthopedics;  Laterality: Left;  carpal tunnel release  . Trigger finger release  08/07/2012    Procedure: RELEASE TRIGGER FINGER/A-1 PULLEY;  Surgeon: Wynonia Sours, MD;  Location: [facility];  Service: Orthopedics;  Laterality: Left;  trigger release left ring finger    Pt has had a cholecystectomy as well as right knee arthroscopy  in 2007. 2008 Left TKR 2009 Colonoscopy with polyp 2012 Revision Left TKR 2014 Bilateral carpal tunnel release   Medications: Prior to Admission medications   Medication Sig Start Date End Date Taking? Authorizing Provider  aspirin 81 MG tablet Take 81 mg by mouth daily.   Yes Historical Provider, MD  fluticasone (FLONASE) 50 MCG/ACT nasal spray Place 2 sprays into the nose as needed.   Yes Historical Provider, MD  HYDROcodone-acetaminophen (NORCO) 5-325 MG per tablet Take 1 tablet by mouth every 6 (six) hours as needed for pain. 08/07/12  Yes Wynonia Sours, MD  metoCLOPramide (REGLAN) 5 MG tablet Take 5 mg by mouth  daily.    Yes Historical Provider, MD  omeprazole (PRILOSEC) 40 MG capsule Take 40 mg by mouth every evening.   Yes Historical Provider, MD  therapeutic multivitamin-minerals (THERAGRAN-M) tablet Take 1 tablet by mouth daily.   Yes Historical Provider, MD  traMADol-acetaminophen (ULTRACET) 37.5-325 MG per tablet Take 1 tablet by mouth every 8 (eight) hours as needed for moderate pain.    Yes Historical Provider, MD  valsartan-hydrochlorothiazide (DIOVAN-HCT) 320-12.5 MG per tablet Take 1 tablet by mouth daily.   Yes Historical Provider, MD  verapamil (COVERA HS) 240 MG (CO) 24 hr tablet Take 240 mg by mouth at bedtime.   Yes Historical Provider, MD  vitamin E (VITAMIN E) 400 UNIT capsule Take 400 Units by mouth daily.   Yes Historical Provider, MD    Allergies:  No Known Allergies  Social History:  reports that he quit smoking about 19 years ago. He does not have any smokeless tobacco history on file. He reports that he does not drink alcohol or use illicit drugs.  Pt is married with 2 children. 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  Family History: No family history on file. Pt's father died of lung cancer and mother alive with AD and CA. Pt has two siblings a brother and a sister. Brother has HTN.   Physical Exam: Filed Vitals:   02/27/14 2345 02/28/14 0000 02/28/14 0015 02/28/14 0030  BP: 137/69 129/69 131/77 129/71  Pulse: 85 83 85 84  Temp:      Resp: 17 17 13 15   SpO2: 95% 97% 98% 98%   General appearance: alert and no distress Head: Normocephalic, without obvious abnormality, atraumatic Eyes: conjunctivae/corneas clear. PERRL, EOM's intact.  Nose: Nares normal. Septum midline. Mucosa normal. No drainage or sinus tenderness. Throat: lips, mucosa, and tongue normal; teeth and gums normal Neck: no adenopathy, no carotid bruit, no JVD and thyroid not enlarged, symmetric, no tenderness/mass/nodules Resp: Bibasilar crackles Cardio: regular rate and rhythm GI: soft,  non-tender; bowel sounds normal; no masses,  no organomegaly Extremities: Minimal right knee effusion; no lower extremities edema; chronic spider veins. Pulses: 2+ and symmetric Lymph nodes:no cervical lymphadenopathy Neurologic: Alert and oriented X 3    Labs on Admission:   Recent Labs  02/27/14 2040  NA 139  K 3.4*  CL 99  CO2 25  GLUCOSE 123*  BUN 23  CREATININE 1.16  CALCIUM 10.1    Recent Labs  02/27/14 2040  AST 23  ALT 28  ALKPHOS 73  BILITOT 0.5  PROT 7.2  ALBUMIN 3.5   No results found for this basename: LIPASE, AMYLASE,  in the last 72 hours  Recent Labs  02/27/14 2040  WBC 10.5  NEUTROABS 7.6  HGB 15.1  HCT 43.0  MCV 86.5  PLT 225   No results found for this basename: CKTOTAL,  CKMB, CKMBINDEX, TROPONINI,  in the last 72 hours Lab Results  Component Value Date   INR 0.92 02/27/2014   INR 0.96 03/11/2011   INR 0.97 12/22/2010   No results found for this basename: TSH, T4TOTAL, FREET3, T3FREE, THYROIDAB,  in the last 72 hours No results found for this basename: VITAMINB12, FOLATE, FERRITIN, TIBC, IRON, RETICCTPCT,  in the last 72 hours  Radiological Exams on Admission: Dg Chest 2 View  02/27/2014   CLINICAL DATA:  Short of breath, left lower chest pain  EXAM: CHEST  2 VIEW  COMPARISON:  Prior chest x-ray 12/22/2010  FINDINGS: Patchy airspace opacity in the left lower lobe partially obscures the hemidiaphragm. Inspiratory volumes are low. Cardiac and mediastinal contours are within normal limits. Remainder the lungs are clear. No pleural effusion or pneumothorax. No acute osseous abnormality.  IMPRESSION: Left lower lobe patchy airspace disease concerning for bronchopneumonia.   Electronically Signed   By: Jacqulynn Cadet M.D.   On: 02/27/2014 22:06   Ct Angio Chest W/cm &/or Wo Cm  02/27/2014   CLINICAL DATA:  Chest pain, history hypertension, GERD, hiatal hernia, former smoker  EXAM: CT ANGIOGRAPHY CHEST WITH CONTRAST  TECHNIQUE: Multidetector CT  imaging of the chest was performed using the standard protocol during bolus administration of intravenous contrast. Multiplanar CT image reconstructions and MIPs were obtained to evaluate the vascular anatomy.  CONTRAST:  159mL OMNIPAQUE IOHEXOL 350 MG/ML SOLN  COMPARISON:  None  FINDINGS: Aorta normal caliber without aneurysm or dissection.  Pulmonary arteries well opacified.  Large filling defect identified in LEFT lower lobe pulmonary artery consistent with pulmonary embolism.  RV:LV ratio is 1.22, elevated, indicative of submassive pulmonary embolism and RIGHT heart strain.  No additional pulmonary emboli identified.  Tiny hepatic cysts lateral segment LEFT lobe 8 mm diameter.  No thoracic adenopathy.  Bibasilar atelectasis greater on LEFT.  Upper lungs clear.  No infiltrate, pleural effusion or pneumothorax.  No osseous abnormalities.  Review of the MIP images confirms the above findings.  IMPRESSION: Large pulmonary embolus in LEFT lower lobe pulmonary artery.  Positive for acute PE with CT evidence of right heart strain (RV/LV Ratio = 1.22) consistent with at least submassive (intermediate risk)PE. The presence of right heart strain has been associated with an increased risk of morbidity and mortality.  Consultation with Pulmonary and Critical Care Medicine is recommended.  Critical Value/emergent results were called by telephone at the time of interpretation on 02/27/2014 at 11:55 PM to [place] , who verbally acknowledged these results.   Electronically Signed   By: Lavonia Dana M.D.   On: 02/27/2014 23:55   Orders placed during the hospital encounter of 02/27/14  . EKG 12-LEAD-personally reviewed-sinus tachycardia with poor R-wave progression     Assessment/Plan Principal Problem: 1. Pulmonary embolism on left- large clot burden with findings concerning for right heart strain on CT. Will admit to step down bed for close hemodynamic monitoring. Continue heparin drip which was initiated in the  emergency department. Transition to oral anticoagulant when stable. Will obtain echocardiogram to evaluate right heart strain and lower extremity Dopplers to rule out additional clot which may increase his risk of further clot burden. If present, consider IVC filter. Morphine as needed for pleuritic chest pain.  I discussed the case with Dr. Chase Caller, critical care, and we agree that with his hemodynamic stability direct lytic therapy is not indicated at this time.  Active Problems: 2. HYPERTENSION-we'll hold antihypertensive therapy to ensure that he is hemodynamically stable.  3. Knee pain, right-recent right knee effusion likely secondary to osteoarthritis. Arthrocentesis may have been a risk factor for his blood clot. 4. Disposition- anticipate discharge to home in 2-3 days if stable and transitioned to oral anticoagulation.  Frank Johnston 02/28/2014, 12:58 AM

## 2014-03-01 DIAGNOSIS — I10 Essential (primary) hypertension: Secondary | ICD-10-CM | POA: Diagnosis not present

## 2014-03-01 DIAGNOSIS — I2699 Other pulmonary embolism without acute cor pulmonale: Secondary | ICD-10-CM

## 2014-03-01 DIAGNOSIS — M25569 Pain in unspecified knee: Secondary | ICD-10-CM | POA: Diagnosis not present

## 2014-03-01 LAB — CBC
HCT: 41.6 % (ref 39.0–52.0)
Hemoglobin: 14.4 g/dL (ref 13.0–17.0)
MCH: 29.9 pg (ref 26.0–34.0)
MCHC: 34.6 g/dL (ref 30.0–36.0)
MCV: 86.3 fL (ref 78.0–100.0)
Platelets: 211 10*3/uL (ref 150–400)
RBC: 4.82 MIL/uL (ref 4.22–5.81)
RDW: 13.3 % (ref 11.5–15.5)
WBC: 14.4 10*3/uL — AB (ref 4.0–10.5)

## 2014-03-01 LAB — HEPARIN LEVEL (UNFRACTIONATED): Heparin Unfractionated: 0.62 IU/mL (ref 0.30–0.70)

## 2014-03-01 LAB — BASIC METABOLIC PANEL
BUN: 18 mg/dL (ref 6–23)
CALCIUM: 9.2 mg/dL (ref 8.4–10.5)
CHLORIDE: 105 meq/L (ref 96–112)
CO2: 23 mEq/L (ref 19–32)
Creatinine, Ser: 0.74 mg/dL (ref 0.50–1.35)
GFR calc Af Amer: >90 mL/min (ref >90)
GFR, EST NON AFRICAN AMERICAN: 89 mL/min — AB (ref >90)
Glucose, Bld: 148 mg/dL — ABNORMAL HIGH (ref 70–99)
Potassium: 4.2 mEq/L (ref 3.7–5.3)
Sodium: 139 mEq/L (ref 137–147)

## 2014-03-01 LAB — GLUCOSE, CAPILLARY: GLUCOSE-CAPILLARY: 93 mg/dL (ref 70–99)

## 2014-03-01 NOTE — Progress Notes (Signed)
*  PRELIMINARY RESULTS* Vascular Ultrasound Lower extremity venous duplex has been completed.  Preliminary findings: DVT involving the right common femoral and profunda femoral veins. No DVT on the left.  Landry Mellow, RDMS, RVT  03/01/2014, 12:56 PM

## 2014-03-01 NOTE — Progress Notes (Addendum)
Subjective: He feels much better today. Echo reviewed with patient and family.  Objective: Vital signs in last 24 hours: Temp:  [97.7 F (36.5 C)-98.2 F (36.8 C)] 98 F (36.7 C) (05/30 0815) Pulse Rate:  [77-92] 83 (05/30 0815) Resp:  [16-26] 16 (05/30 0815) BP: (122-155)/(68-94) 153/88 mmHg (05/30 0815) SpO2:  [93 %-98 %] 98 % (05/30 0815) Weight:  [91.3 kg (201 lb 4.5 oz)] 91.3 kg (201 lb 4.5 oz) (05/30 0435) Weight change: 1.4 kg (3 lb 1.4 oz) Last BM Date: 02/27/14  Intake/Output from previous day: 05/29 0701 - 05/30 0700 In: 3273 [P.O.:870; I.V.:2403] Out: 1525 [Urine:1525] Intake/Output this shift: Total I/O In: 360 [P.O.:360] Out: 300 [Urine:300]  General appearance: alert, cooperative and appears stated age Resp: clear to auscultation bilaterally Cardio: regular rate and rhythm, S1, S2 normal, no murmur, click, rub or gallop GI: soft, non-tender; bowel sounds normal; no masses,  no organomegaly Extremities: extremities normal, atraumatic, no cyanosis or edema Neurologic: Grossly normal   Lab Results:  Recent Labs  02/28/14 0950 03/01/14 0318  WBC 9.2 14.4*  HGB 14.0 14.4  HCT 42.0 41.6  PLT 208 211   BMET  Recent Labs  02/28/14 0950 03/01/14 0318  NA 138 139  K 3.6* 4.2  CL 100 105  CO2 27 23  GLUCOSE 93 148*  BUN 18 18  CREATININE 0.91 0.74  CALCIUM 9.3 9.2   CMET CMP     Component Value Date/Time   NA 139 03/01/2014 0318   K 4.2 03/01/2014 0318   CL 105 03/01/2014 0318   CO2 23 03/01/2014 0318   GLUCOSE 148* 03/01/2014 0318   BUN 18 03/01/2014 0318   CREATININE 0.74 03/01/2014 0318   CALCIUM 9.2 03/01/2014 0318   PROT 7.2 02/27/2014 2040   ALBUMIN 3.5 02/27/2014 2040   AST 23 02/27/2014 2040   ALT 28 02/27/2014 2040   ALKPHOS 73 02/27/2014 2040   BILITOT 0.5 02/27/2014 2040   GFRNONAA 89* 03/01/2014 0318   GFRAA >90 03/01/2014 0318     Studies/Results: Dg Chest 2 View  02/27/2014   CLINICAL DATA:  Short of breath, left lower chest pain   EXAM: CHEST  2 VIEW  COMPARISON:  Prior chest x-ray 12/22/2010  FINDINGS: Patchy airspace opacity in the left lower lobe partially obscures the hemidiaphragm. Inspiratory volumes are low. Cardiac and mediastinal contours are within normal limits. Remainder the lungs are clear. No pleural effusion or pneumothorax. No acute osseous abnormality.  IMPRESSION: Left lower lobe patchy airspace disease concerning for bronchopneumonia.   Electronically Signed   By: Jacqulynn Cadet M.D.   On: 02/27/2014 22:06   Ct Angio Chest W/cm &/or Wo Cm  02/27/2014   CLINICAL DATA:  Chest pain, history hypertension, GERD, hiatal hernia, former smoker  EXAM: CT ANGIOGRAPHY CHEST WITH CONTRAST  TECHNIQUE: Multidetector CT imaging of the chest was performed using the standard protocol during bolus administration of intravenous contrast. Multiplanar CT image reconstructions and MIPs were obtained to evaluate the vascular anatomy.  CONTRAST:  138mL OMNIPAQUE IOHEXOL 350 MG/ML SOLN  COMPARISON:  None  FINDINGS: Aorta normal caliber without aneurysm or dissection.  Pulmonary arteries well opacified.  Large filling defect identified in LEFT lower lobe pulmonary artery consistent with pulmonary embolism.  RV:LV ratio is 1.22, elevated, indicative of submassive pulmonary embolism and RIGHT heart strain.  No additional pulmonary emboli identified.  Tiny hepatic cysts lateral segment LEFT lobe 8 mm diameter.  No thoracic adenopathy.  Bibasilar atelectasis greater on LEFT.  Upper lungs clear.  No infiltrate, pleural effusion or pneumothorax.  No osseous abnormalities.  Review of the MIP images confirms the above findings.  IMPRESSION: Large pulmonary embolus in LEFT lower lobe pulmonary artery.  Positive for acute PE with CT evidence of right heart strain (RV/LV Ratio = 1.22) consistent with at least submassive (intermediate risk)PE. The presence of right heart strain has been associated with an increased risk of morbidity and mortality.   Consultation with Pulmonary and Critical Care Medicine is recommended.  Critical Value/emergent results were called by telephone at the time of interpretation on 02/27/2014 at 11:55 PM to [place] , who verbally acknowledged these results.   Electronically Signed   By: Lavonia Dana M.D.   On: 02/27/2014 23:55    Medications: I have reviewed the patient's current medications.  Marland Kitchen antiseptic oral rinse  15 mL Mouth Rinse BID  . fluticasone  2 spray Each Nare Daily  . metoCLOPramide  5 mg Oral Daily  . multivitamin with minerals  1 tablet Oral Daily  . pantoprazole  40 mg Oral Daily  . polyethylene glycol  17 g Oral BID  . predniSONE  40 mg Oral QAC breakfast  . senna-docusate  1 tablet Oral BID  . sodium chloride  3 mL Intravenous Q12H     Assessment/Plan:   Principal Problem:   Pulmonary embolism on left-large. But he is improving. Continue heparin gtt for 3-5 days and then defer to PCP which oral anticoagulant to use thereafter.  bp is up to 170/87 now. i will reduce ivf. i will stop prednisone (although the prednisone and naproxen he received yesterday probably reduced his pain greatly, we will resume some prednisone if needed). Active Problems:-   HYPERTENSION-follow   Knee pain, right-follow      LOS: 2 days   Jerlyn Ly, MD 03/01/2014, 10:36 AM

## 2014-03-01 NOTE — Progress Notes (Signed)
ANTICOAGULATION CONSULT NOTE - Follow Up Consult  Pharmacy Consult for Heparin Indication: pulmonary embolus  No Known Allergies  Patient Measurements: Height: 5\' 8"  (172.7 cm) Weight: 201 lb 4.5 oz (91.3 kg) IBW/kg (Calculated) : 68.4 Heparin Dosing Weight: 87kg  Vital Signs: Temp: 98 F (36.7 C) (05/30 0815) Temp src: Oral (05/30 0815) BP: 153/88 mmHg (05/30 0815) Pulse Rate: 83 (05/30 0815)  Labs:  Recent Labs  02/27/14 2040 02/28/14 0338 02/28/14 0950 02/28/14 2053 03/01/14 0318  HGB 15.1 14.8 14.0  --  14.4  HCT 43.0 42.7 42.0  --  41.6  PLT 225 224 208  --  211  APTT 30  --   --   --   --   LABPROT 12.2  --   --   --   --   INR 0.92  --   --   --   --   HEPARINUNFRC  --   --  1.03* 0.57 0.62  CREATININE 1.16  --  0.91  --  0.74    Estimated Creatinine Clearance: 90.3 ml/min (by C-G formula based on Cr of 0.74).   Medications:  Infusions:  . sodium chloride 100 mL/hr at 02/28/14 2000  . heparin 1,050 Units/hr (02/28/14 2000)    Assessment: 72 YOM on IV heparin for CTa consistent with LLL PE and right heart strain. Initial heparin level is SUPRA-therapeutic at 1.03, drip held for 1.5 hour and resumed at 1050 units/hr. A repeat heparin level drawn with new drip rate remains therapeutic at 0.62 units/mL. H/H, plt wnl and stable with no bleeding noted.  Goal of Therapy:  Heparin level 0.3-0.7 units/ml Monitor platelets by anticoagulation protocol: Yes   Plan:  1. Continue heparin at 1050 units/hr 2. Daily heparin level and CBC 3. Follow for s/s bleeding and long term anticoagulation plans  Harolyn Rutherford, PharmD Clinical Pharmacist - Resident Pager: [phone] Pharmacy: [phone] 03/01/2014 9:01 AM

## 2014-03-02 DIAGNOSIS — I10 Essential (primary) hypertension: Secondary | ICD-10-CM | POA: Diagnosis not present

## 2014-03-02 DIAGNOSIS — I2699 Other pulmonary embolism without acute cor pulmonale: Secondary | ICD-10-CM | POA: Diagnosis not present

## 2014-03-02 DIAGNOSIS — M25569 Pain in unspecified knee: Secondary | ICD-10-CM | POA: Diagnosis not present

## 2014-03-02 DIAGNOSIS — I82409 Acute embolism and thrombosis of unspecified deep veins of unspecified lower extremity: Secondary | ICD-10-CM | POA: Diagnosis present

## 2014-03-02 DIAGNOSIS — I129 Hypertensive chronic kidney disease with stage 1 through stage 4 chronic kidney disease, or unspecified chronic kidney disease: Secondary | ICD-10-CM | POA: Diagnosis not present

## 2014-03-02 DIAGNOSIS — E119 Type 2 diabetes mellitus without complications: Secondary | ICD-10-CM | POA: Diagnosis not present

## 2014-03-02 LAB — CBC
HEMATOCRIT: 39.1 % (ref 39.0–52.0)
Hemoglobin: 13.3 g/dL (ref 13.0–17.0)
MCH: 29.4 pg (ref 26.0–34.0)
MCHC: 34 g/dL (ref 30.0–36.0)
MCV: 86.3 fL (ref 78.0–100.0)
Platelets: 218 10*3/uL (ref 150–400)
RBC: 4.53 MIL/uL (ref 4.22–5.81)
RDW: 13.6 % (ref 11.5–15.5)
WBC: 12.4 10*3/uL — ABNORMAL HIGH (ref 4.0–10.5)

## 2014-03-02 LAB — BASIC METABOLIC PANEL
BUN: 21 mg/dL (ref 6–23)
CO2: 23 mEq/L (ref 19–32)
Calcium: 9.2 mg/dL (ref 8.4–10.5)
Chloride: 105 mEq/L (ref 96–112)
Creatinine, Ser: 0.74 mg/dL (ref 0.50–1.35)
GFR calc Af Amer: >90 mL/min (ref >90)
GFR calc non Af Amer: 89 mL/min — ABNORMAL LOW (ref >90)
Glucose, Bld: 91 mg/dL (ref 70–99)
Potassium: 4.2 mEq/L (ref 3.7–5.3)
Sodium: 139 mEq/L (ref 137–147)

## 2014-03-02 LAB — GLUCOSE, CAPILLARY: Glucose-Capillary: 84 mg/dL (ref 70–99)

## 2014-03-02 LAB — HEPARIN LEVEL (UNFRACTIONATED): Heparin Unfractionated: 0.62 IU/mL (ref 0.30–0.70)

## 2014-03-02 MED ORDER — RIVAROXABAN 15 MG PO TABS: 15.0000 mg | ORAL_TABLET | Freq: Every day | ORAL | Status: DC

## 2014-03-02 MED ORDER — NAPROXEN 500 MG PO TABS
500.0000 mg | ORAL_TABLET | Freq: Once | ORAL | Status: AC
  Administered 2014-03-02: 500 mg via ORAL
  Filled 2014-03-02: qty 1

## 2014-03-02 MED ORDER — RIVAROXABAN 15 MG PO TABS
15.0000 mg | ORAL_TABLET | Freq: Two times a day (BID) | ORAL | Status: DC
  Administered 2014-03-02 – 2014-03-03 (×2): 15 mg via ORAL
  Filled 2014-03-02 (×4): qty 1

## 2014-03-02 MED ORDER — MORPHINE SULFATE 2 MG/ML IJ SOLN
2.0000 mg | Freq: Once | INTRAMUSCULAR | Status: AC
  Administered 2014-03-02: 2 mg via INTRAVENOUS
  Filled 2014-03-02: qty 1

## 2014-03-02 MED ORDER — PREDNISONE 20 MG PO TABS
40.0000 mg | ORAL_TABLET | Freq: Once | ORAL | Status: AC
  Administered 2014-03-02: 40 mg via ORAL
  Filled 2014-03-02: qty 2

## 2014-03-02 NOTE — Progress Notes (Signed)
Pt complained of sudden  Left-sided chest pain that radiates to his back (L) (rating pain 10/10). Pt looked flushed in face and was diaphoretic. 2 mg of morphine given, EKG obtained (NSR) and MD notified. New orders received from MD and Patient transfer on hold  (for 6 to 8 hrs) for now per MD recommendations. Will continue to assess and monitor patient.

## 2014-03-02 NOTE — ED Provider Notes (Signed)
I saw and evaluated the patient, reviewed the resident's note and I agree with the findings and plan.   EKG Interpretation   Date/Time:  Thursday Feb 27 2014 20:03:03 EDT Ventricular Rate:  101 PR Interval:  198 QRS Duration: 93 QT Interval:  331 QTC Calculation: 429 R Axis:   9 Text Interpretation:  Sinus tachycardia Probable anteroseptal infarct, old  No significant change since last tracing Confirmed by Haya Hemler  MD, Gerldine Suleiman  ([id number]) on 02/27/2014 8:10:33 PM      Frank Johnston is a 73 y.o. male hx of CKD, HTN here with SOB and chest pain. Pleuritic chest pain today. He was noted to be tachycardic. Lungs clear on exam. Concerned about PE. CT angio chest showed submassive PE with some right heart strain. Never hypotensive. Given heparin and admitted to step down.   CRITICAL CARE Performed by: Wandra Arthurs   Total critical care time: 30 min   Critical care time was exclusive of separately billable procedures and treating other patients.  Critical care was necessary to treat or prevent imminent or life-threatening deterioration.  Critical care was time spent personally by me on the following activities: development of treatment plan with patient and/or surrogate as well as nursing, discussions with consultants, evaluation of patient's response to treatment, examination of patient, obtaining history from patient or surrogate, ordering and performing treatments and interventions, ordering and review of laboratory studies, ordering and review of radiographic studies, pulse oximetry and re-evaluation of patient's condition.    Wandra Arthurs, MD 03/02/14 1430

## 2014-03-02 NOTE — Progress Notes (Signed)
Subjective: No return of the severe chest pain.   Doppler did show RLE dvt.  Objective: Vital signs in last 24 hours: Temp:  [97.6 F (36.4 C)-98.1 F (36.7 C)] 98.1 F (36.7 C) (05/31 0756) Pulse Rate:  [71-99] 89 (05/31 0756) Resp:  [13-23] 18 (05/31 0756) BP: (102-170)/(37-87) 132/83 mmHg (05/31 0756) SpO2:  [96 %-100 %] 100 % (05/31 0756) Weight change:  Last BM Date: 03/01/14  Intake/Output from previous day: 05/30 0701 - 05/31 0700 In: 2175.2 [P.O.:840; I.V.:1335.2] Out: 2025 [Urine:2025] Intake/Output this shift:    General appearance: alert, cooperative and appears stated age Resp: clear to auscultation bilaterally Cardio: regular rate and rhythm, S1, S2 normal, no murmur, click, rub or gallop GI: soft, non-tender; bowel sounds normal; no masses,  no organomegaly Extremities: extremities normal, atraumatic, no cyanosis or edema Neurologic: Grossly normal   Lab Results:  Recent Labs  03/01/14 0318 03/02/14 0405  WBC 14.4* 12.4*  HGB 14.4 13.3  HCT 41.6 39.1  PLT 211 218   BMET  Recent Labs  03/01/14 0318 03/02/14 0405  NA 139 139  K 4.2 4.2  CL 105 105  CO2 23 23  GLUCOSE 148* 91  BUN 18 21  CREATININE 0.74 0.74  CALCIUM 9.2 9.2   CMET CMP     Component Value Date/Time   NA 139 03/02/2014 0405   K 4.2 03/02/2014 0405   CL 105 03/02/2014 0405   CO2 23 03/02/2014 0405   GLUCOSE 91 03/02/2014 0405   BUN 21 03/02/2014 0405   CREATININE 0.74 03/02/2014 0405   CALCIUM 9.2 03/02/2014 0405   PROT 7.2 02/27/2014 2040   ALBUMIN 3.5 02/27/2014 2040   AST 23 02/27/2014 2040   ALT 28 02/27/2014 2040   ALKPHOS 73 02/27/2014 2040   BILITOT 0.5 02/27/2014 2040   GFRNONAA 89* 03/02/2014 0405   GFRAA >90 03/02/2014 0405     Studies/Results: No results found.  Medications: I have reviewed the patient's current medications.  Marland Kitchen antiseptic oral rinse  15 mL Mouth Rinse BID  . fluticasone  2 spray Each Nare Daily  . metoCLOPramide  5 mg Oral Daily  .  multivitamin with minerals  1 tablet Oral Daily  . pantoprazole  40 mg Oral Daily  . polyethylene glycol  17 g Oral BID  . senna-docusate  1 tablet Oral BID  . sodium chloride  3 mL Intravenous Q12H   Heparin gtt too.  Assessment/Plan:  Principal Problem:   Pulmonary embolism on left with RLE dvt-cont. Hep gtt. Move to floor. Defer choice of oral anticoag to pcp tomorrow. Active Problems:   HYPERTENSION-stable   Knee pain, right-follow     LOS: 3 days   Frank Ly, MD 03/02/2014, 9:36 AM

## 2014-03-02 NOTE — Progress Notes (Signed)
[place] for Heparin ->Xarelto Indication: pulmonary embolus/RLE DVT  No Known Allergies  Patient Measurements: Height: 5\' 8"  (172.7 cm) Weight: 201 lb 4.5 oz (91.3 kg) IBW/kg (Calculated) : 68.4  Vital Signs: Temp: 98.2 F (36.8 C) (05/31 2111) Temp src: Oral (05/31 2111) BP: 160/90 mmHg (05/31 2111) Pulse Rate: 75 (05/31 2111)  Labs:  Recent Labs  02/28/14 0950 02/28/14 2053 03/01/14 0318 03/02/14 0405  HGB 14.0  --  14.4 13.3  HCT 42.0  --  41.6 39.1  PLT 208  --  211 218  HEPARINUNFRC 1.03* 0.57 0.62 0.62  CREATININE 0.91  --  0.74 0.74    Estimated Creatinine Clearance: 90.3 ml/min (by C-G formula based on Cr of 0.74).   Medications:  Infusions:  . sodium chloride 20 mL/hr (03/01/14 1123)    Assessment: 72 YOM on IV heparin for LLL PE and right heart strain, doppler also showing RLE DVT. Now to transition to Xarelto. Renal function stable. No bleeding noted.  Goal of Therapy:  Treatment and prevention of VTE Monitor platelets by anticoagulation protocol: Yes   Plan:  1. D/c heparin when first dose of Xarelto given 2. D/c heparin level and daily CBC 3. Xarelto 15mg  po BID x 21 days. First dose now. Then 20mg  daily with supper thereafter 4. Will f/u renal function and s/s bleeding  Sherlon Handing, PharmD, BCPS Clinical pharmacist, pager [phone] 03/02/2014 10:07 PM

## 2014-03-02 NOTE — Progress Notes (Signed)
ANTICOAGULATION CONSULT NOTE - Follow Up Consult  Pharmacy Consult for Heparin Indication: pulmonary embolus/RLE DVT  No Known Allergies  Patient Measurements: Height: 5\' 8"  (172.7 cm) Weight: 201 lb 4.5 oz (91.3 kg) IBW/kg (Calculated) : 68.4 Heparin Dosing Weight: 87kg  Vital Signs: Temp: 98.1 F (36.7 C) (05/31 0756) Temp src: Oral (05/31 0512) BP: 132/83 mmHg (05/31 0756) Pulse Rate: 89 (05/31 0756)  Labs:  Recent Labs  02/27/14 2040  02/28/14 0950 02/28/14 2053 03/01/14 0318 03/02/14 0405  HGB 15.1  < > 14.0  --  14.4 13.3  HCT 43.0  < > 42.0  --  41.6 39.1  PLT 225  < > 208  --  211 218  APTT 30  --   --   --   --   --   LABPROT 12.2  --   --   --   --   --   INR 0.92  --   --   --   --   --   HEPARINUNFRC  --   < > 1.03* 0.57 0.62 0.62  CREATININE 1.16  --  0.91  --  0.74 0.74  < > = values in this interval not displayed.  Estimated Creatinine Clearance: 90.3 ml/min (by C-G formula based on Cr of 0.74).   Medications:  Infusions:  . sodium chloride 20 mL/hr (03/01/14 1123)  . heparin 1,050 Units/hr (03/01/14 1949)    Assessment: Frank Johnston on IV heparin for LLL PE and right heart strain, doppler also showing RLE DVT. HL remains therapeutic at 0.62. H/H, plt wnl and stable with no bleeding noted.  Goal of Therapy:  Heparin level 0.3-0.7 units/ml Monitor platelets by anticoagulation protocol: Yes   Plan:  1. Continue heparin at 1050 units/hr 2. Daily heparin level and CBC 3. Follow for s/s bleeding and long term anticoagulation plans  Harolyn Rutherford, PharmD Clinical Pharmacist - Resident Pager: [phone] Pharmacy: [phone] 03/02/2014 9:41 AM

## 2014-03-03 DIAGNOSIS — R269 Unspecified abnormalities of gait and mobility: Secondary | ICD-10-CM | POA: Diagnosis not present

## 2014-03-03 DIAGNOSIS — I2699 Other pulmonary embolism without acute cor pulmonale: Secondary | ICD-10-CM | POA: Diagnosis not present

## 2014-03-03 DIAGNOSIS — M25569 Pain in unspecified knee: Secondary | ICD-10-CM | POA: Diagnosis not present

## 2014-03-03 DIAGNOSIS — N184 Chronic kidney disease, stage 4 (severe): Secondary | ICD-10-CM | POA: Diagnosis not present

## 2014-03-03 LAB — BASIC METABOLIC PANEL
BUN: 21 mg/dL (ref 6–23)
CALCIUM: 9.2 mg/dL (ref 8.4–10.5)
CO2: 26 mEq/L (ref 19–32)
Chloride: 104 mEq/L (ref 96–112)
Creatinine, Ser: 0.89 mg/dL (ref 0.50–1.35)
GFR calc Af Amer: >90 mL/min (ref >90)
GFR calc non Af Amer: 83 mL/min — ABNORMAL LOW (ref >90)
GLUCOSE: 114 mg/dL — AB (ref 70–99)
POTASSIUM: 4.6 meq/L (ref 3.7–5.3)
Sodium: 140 mEq/L (ref 137–147)

## 2014-03-03 LAB — GLUCOSE, CAPILLARY: Glucose-Capillary: 94 mg/dL (ref 70–99)

## 2014-03-03 MED ORDER — RIVAROXABAN 20 MG PO TABS
20.0000 mg | ORAL_TABLET | Freq: Every day | ORAL | Status: DC
Start: 2014-03-24 — End: 2014-03-03

## 2014-03-03 MED ORDER — ACETAMINOPHEN 325 MG PO TABS: 650.0000 mg | ORAL_TABLET | Freq: Four times a day (QID) | ORAL | Status: DC | PRN

## 2014-03-03 MED ORDER — RIVAROXABAN 15 MG PO TABS: 15.0000 mg | ORAL_TABLET | Freq: Two times a day (BID) | ORAL | Status: DC

## 2014-03-03 MED ORDER — SENNOSIDES-DOCUSATE SODIUM 8.6-50 MG PO TABS: 1.0000 | ORAL_TABLET | Freq: Every evening | ORAL | Status: DC | PRN

## 2014-03-03 NOTE — Progress Notes (Signed)
IV and tele monitor d/c at this time; pt to d/c home with wife; pt verbalized understanding of d/c instructions; will cont. To monitor.

## 2014-03-03 NOTE — Progress Notes (Signed)
CARE MANAGEMENT NOTE 03/03/2014  Patient:  Frank Johnston, Frank Johnston   Account Number:  [id number]  Date Initiated:  03/03/2014  Documentation initiated by:  [facility]  Subjective/Objective Assessment:   Pulmonary embolism on left     Action/Plan:   lives at home with wife.   Anticipated DC Date:  03/03/2014   Anticipated DC Plan:  [place]  CM consult  Medication Assistance      Choice offered to / List presented to:             Status of service:   Medicare Important Message given?  YES (If response is "NO", the following Medicare IM given date fields will be blank) Date Medicare IM given:  03/30/2014 Date Additional Medicare IM given:    Discharge Disposition:  HOME/SELF CARE  Per UR Regulation:    If discussed at Long Length of Stay Meetings, dates discussed:    Comments:  03/03/2014 1600 NCM spoke to pt and states his copay for Xarelto was $28.00 with his insurance.  Jonnie Finner RN CCM Case Mgmt phone [phone]

## 2014-03-03 NOTE — Discharge Summary (Addendum)
Physician Discharge Summary  Patient ID: JANET DECESARE MRN: [id number] DOB/AGE: 12-18-1940 73 y.o.  Admit date: 02/27/2014 Discharge date: 03/03/2014   Discharge Diagnoses:  Principal Problem:   Pulmonary embolism on left Active Problems:   HYPERTENSION   Knee pain, right   PE (pulmonary embolism)   DVT (deep venous thrombosis)   Discharged Condition: good  Hospital Course: Mr. Letizia is a 73 year old white male with a history of hypertension who presented to the emergency department with the complaint of left-sided chest pain. Patient states that he has been feeling little wiped out for the past 2 days. Saturday after moving a trash cans he developed severe left-sided chest pain. The pain was much worse after taking a deep breath. So she symptoms included mild shortness of breath do to splinting. No cough or wheezing. He took a pain pill and try to taken out but when he awoke the pain became even more severe. His wife drove him partially to the emergency department and then called EMS who brought him to the ER. In the ER he was found to be tachycardic with oxygen saturations 84% initially. Chest CT was performed and showed a large pulmonary embolism in the left lower lobe pulmonary artery with evidence of right heart strain on CT. He is remain hemodynamically stable throughout his stay in the emergency room with oxygen saturations remaining above 90% on 2 L oxygen and stable blood pressures. Following IV fluids his tachycardia has resolved. He denies any prior history of blood clots. He states he's had a recent right knee effusion that was aspirated about one week ago by orthopedics. The aspirate had a significant amount of blood. He denies any lower extremity edema. No recent trips or other procedures. No family history of PE/DVT.  He was started on IV heparin dosed per pharmacist recommendations. He also had an echocardiogram done that showed normal left ventricular systolic function and  no evidence of right ventricular strain. He had a bilateral DVT ultrasound exam that showed a right leg DVT. His symptoms improved greatly with heparin and prednisone to treat his pleuritic chest pain. He was transitioned over to Xarelto with no problems. There were no complications from his hospitalization.   Consults: cardiology and pulmonary/intensive care  Significant Diagnostic Studies:  No results found.  Labs: Lab Results  Component Value Date   WBC 12.4* 03/02/2014   HGB 13.3 03/02/2014   HCT 39.1 03/02/2014   MCV 86.3 03/02/2014   PLT 218 03/02/2014     Recent Labs Lab 02/27/14 2040  03/03/14 0413  NA 139  < > 140  K 3.4*  < > 4.6  CL 99  < > 104  CO2 25  < > 26  BUN 23  < > 21  CREATININE 1.16  < > 0.89  CALCIUM 10.1  < > 9.2  PROT 7.2  --   --   BILITOT 0.5  --   --   ALKPHOS 73  --   --   ALT 28  --   --   AST 23  --   --   GLUCOSE 123*  < > 114*  < > = values in this interval not displayed.     Lab Results  Component Value Date   INR 0.92 02/27/2014   INR 0.96 03/11/2011   INR 0.97 12/22/2010     Recent Results (from the past 240 hour(s))  MRSA PCR SCREENING     Status: None   Collection Time  02/28/14  3:10 AM      Result Value Ref Range Status   MRSA by PCR NEGATIVE  NEGATIVE Final   Comment:            The GeneXpert MRSA Assay (FDA     approved for NASAL specimens     only), is one component of a     comprehensive MRSA colonization     surveillance program. It is not     intended to diagnose MRSA     infection nor to guide or     monitor treatment for     MRSA infections.      Discharge Exam: Blood pressure 137/71, pulse 80, temperature 97.6 F (36.4 C), temperature source Oral, resp. rate 19, height 5\' 8"  (1.727 m), weight 91.3 kg (201 lb 4.5 oz), SpO2 96.00%.  Physical Exam: In general, the patient is a well-nourished well-developed white man who was in no apparent distress while lying partially upright in bed on room air. HEENT exam was  within normal limits, neck was supple without jugular venous distention, chest was clear to auscultation, heart had a regular rate and rhythm without significant murmur or gallop, abdomen had mild distention with mild tympany, normal bowel sounds, and no tenderness. He had bilateral trace leg edema. He was alert and well oriented with normal affect.  Disposition: He'll be discharged to home today and was advised to stop by our office to pick up a coupon to assist with pain meds of initial Xarelto dosing. He should schedule a followup visit with Dr. Leanna Battles within one week of discharge from the hospital.      Discharge Instructions   Call MD for:    Complete by:  As directed   Call for fever, chills, worsening breathing, unusual bleeding, or other concerning  symptoms     Diet - low sodium heart healthy    Complete by:  As directed      Discharge instructions    Complete by:  As directed   Stop by our office to pick up coupon for Xarelto.     Increase activity slowly    Complete by:  As directed             Medication List    STOP taking these medications       aspirin 81 MG tablet     verapamil 240 MG (CO) 24 hr tablet  Commonly known as:  COVERA HS      TAKE these medications       acetaminophen 325 MG tablet  Commonly known as:  TYLENOL  Take 2 tablets (650 mg total) by mouth every 6 (six) hours as needed for mild pain (or Fever >/= 101).     fluticasone 50 MCG/ACT nasal spray  Commonly known as:  FLONASE  Place 2 sprays into the nose as needed.     HYDROcodone-acetaminophen 5-325 MG per tablet  Commonly known as:  NORCO  Take 1 tablet by mouth every 6 (six) hours as needed for pain.     metoCLOPramide 5 MG tablet  Commonly known as:  REGLAN  Take 5 mg by mouth daily.     omeprazole 40 MG capsule  Commonly known as:  PRILOSEC  Take 40 mg by mouth every evening.     Rivaroxaban 15 MG Tabs tablet  Commonly known as:  XARELTO  Take 1 tablet (15 mg total)  by mouth 2 (two) times daily with a meal.  senna-docusate 8.6-50 MG per tablet  Commonly known as:  Senokot-S  Take 1 tablet by mouth at bedtime as needed for mild constipation.     therapeutic multivitamin-minerals tablet  Take 1 tablet by mouth daily.     traMADol-acetaminophen 37.5-325 MG per tablet  Commonly known as:  ULTRACET  Take 1 tablet by mouth every 8 (eight) hours as needed for moderate pain.     valsartan-hydrochlorothiazide 320-12.5 MG per tablet  Commonly known as:  DIOVAN-HCT  Take 1 tablet by mouth daily.     vitamin E 400 UNIT capsule  Generic drug:  vitamin E  Take 400 Units by mouth daily.       Follow-up Information   Follow up with [name], MD. Schedule an appointment as soon as possible for a visit in 1 week.   Specialty:  Internal Medicine   Contact information:   [place] [address] [phone]       Signed: Leanna Battles 03/03/2014, 8:27 AM

## 2014-03-07 DIAGNOSIS — I10 Essential (primary) hypertension: Secondary | ICD-10-CM | POA: Diagnosis not present

## 2014-03-07 DIAGNOSIS — M545 Low back pain, unspecified: Secondary | ICD-10-CM | POA: Diagnosis not present

## 2014-03-07 DIAGNOSIS — Z6829 Body mass index (BMI) 29.0-29.9, adult: Secondary | ICD-10-CM | POA: Diagnosis not present

## 2014-03-07 DIAGNOSIS — I2699 Other pulmonary embolism without acute cor pulmonale: Secondary | ICD-10-CM | POA: Diagnosis not present

## 2014-03-12 DIAGNOSIS — M25819 Other specified joint disorders, unspecified shoulder: Secondary | ICD-10-CM | POA: Diagnosis not present

## 2014-04-01 DIAGNOSIS — I2699 Other pulmonary embolism without acute cor pulmonale: Secondary | ICD-10-CM | POA: Diagnosis not present

## 2014-04-01 DIAGNOSIS — M545 Low back pain, unspecified: Secondary | ICD-10-CM | POA: Diagnosis not present

## 2014-04-01 DIAGNOSIS — I10 Essential (primary) hypertension: Secondary | ICD-10-CM | POA: Diagnosis not present

## 2014-04-01 DIAGNOSIS — Z6829 Body mass index (BMI) 29.0-29.9, adult: Secondary | ICD-10-CM | POA: Diagnosis not present

## 2014-04-07 DIAGNOSIS — M1710 Unilateral primary osteoarthritis, unspecified knee: Secondary | ICD-10-CM | POA: Diagnosis not present

## 2014-04-10 DIAGNOSIS — M545 Low back pain, unspecified: Secondary | ICD-10-CM | POA: Diagnosis not present

## 2014-04-10 DIAGNOSIS — Z6830 Body mass index (BMI) 30.0-30.9, adult: Secondary | ICD-10-CM | POA: Diagnosis not present

## 2014-04-10 DIAGNOSIS — I2699 Other pulmonary embolism without acute cor pulmonale: Secondary | ICD-10-CM | POA: Diagnosis not present

## 2014-04-10 DIAGNOSIS — I10 Essential (primary) hypertension: Secondary | ICD-10-CM | POA: Diagnosis not present

## 2014-06-20 DIAGNOSIS — M545 Low back pain, unspecified: Secondary | ICD-10-CM | POA: Diagnosis not present

## 2014-06-20 DIAGNOSIS — I2699 Other pulmonary embolism without acute cor pulmonale: Secondary | ICD-10-CM | POA: Diagnosis not present

## 2014-06-20 DIAGNOSIS — Z6831 Body mass index (BMI) 31.0-31.9, adult: Secondary | ICD-10-CM | POA: Diagnosis not present

## 2014-06-20 DIAGNOSIS — Z23 Encounter for immunization: Secondary | ICD-10-CM | POA: Diagnosis not present

## 2014-06-24 DIAGNOSIS — H40019 Open angle with borderline findings, low risk, unspecified eye: Secondary | ICD-10-CM | POA: Diagnosis not present

## 2014-06-24 DIAGNOSIS — H268 Other specified cataract: Secondary | ICD-10-CM | POA: Diagnosis not present

## 2014-07-10 ENCOUNTER — Other Ambulatory Visit: Payer: Self-pay | Admitting: Internal Medicine

## 2014-07-10 DIAGNOSIS — M545 Low back pain, unspecified: Secondary | ICD-10-CM

## 2014-07-10 DIAGNOSIS — M25561 Pain in right knee: Secondary | ICD-10-CM | POA: Diagnosis not present

## 2014-07-11 DIAGNOSIS — M1711 Unilateral primary osteoarthritis, right knee: Secondary | ICD-10-CM | POA: Diagnosis not present

## 2014-07-15 ENCOUNTER — Ambulatory Visit
Admission: RE | Admit: 2014-07-15 | Discharge: 2014-07-15 | Disposition: A | Discharge status: Home / Self Care | Payer: Medicare Other | Source: Ambulatory Visit | Attending: Internal Medicine | Admitting: Internal Medicine

## 2014-07-15 DIAGNOSIS — M47816 Spondylosis without myelopathy or radiculopathy, lumbar region: Secondary | ICD-10-CM | POA: Diagnosis not present

## 2014-07-15 DIAGNOSIS — M545 Low back pain, unspecified: Secondary | ICD-10-CM

## 2014-07-15 DIAGNOSIS — M5136 Other intervertebral disc degeneration, lumbar region: Secondary | ICD-10-CM | POA: Diagnosis not present

## 2014-08-07 DIAGNOSIS — M961 Postlaminectomy syndrome, not elsewhere classified: Secondary | ICD-10-CM | POA: Diagnosis not present

## 2014-08-07 DIAGNOSIS — M47816 Spondylosis without myelopathy or radiculopathy, lumbar region: Secondary | ICD-10-CM | POA: Diagnosis not present

## 2014-08-12 DIAGNOSIS — H40013 Open angle with borderline findings, low risk, bilateral: Secondary | ICD-10-CM | POA: Diagnosis not present

## 2014-08-12 DIAGNOSIS — H2589 Other age-related cataract: Secondary | ICD-10-CM | POA: Diagnosis not present

## 2014-08-19 DIAGNOSIS — M47816 Spondylosis without myelopathy or radiculopathy, lumbar region: Secondary | ICD-10-CM | POA: Diagnosis not present

## 2014-08-22 DIAGNOSIS — M47816 Spondylosis without myelopathy or radiculopathy, lumbar region: Secondary | ICD-10-CM | POA: Diagnosis not present

## 2014-08-25 DIAGNOSIS — Z6831 Body mass index (BMI) 31.0-31.9, adult: Secondary | ICD-10-CM | POA: Diagnosis not present

## 2014-08-25 DIAGNOSIS — I2699 Other pulmonary embolism without acute cor pulmonale: Secondary | ICD-10-CM | POA: Diagnosis not present

## 2014-08-25 DIAGNOSIS — M5136 Other intervertebral disc degeneration, lumbar region: Secondary | ICD-10-CM | POA: Diagnosis not present

## 2014-08-26 DIAGNOSIS — M47816 Spondylosis without myelopathy or radiculopathy, lumbar region: Secondary | ICD-10-CM | POA: Diagnosis not present

## 2014-09-02 DIAGNOSIS — M47816 Spondylosis without myelopathy or radiculopathy, lumbar region: Secondary | ICD-10-CM | POA: Diagnosis not present

## 2014-09-05 DIAGNOSIS — M47816 Spondylosis without myelopathy or radiculopathy, lumbar region: Secondary | ICD-10-CM | POA: Diagnosis not present

## 2014-09-08 DIAGNOSIS — M47816 Spondylosis without myelopathy or radiculopathy, lumbar region: Secondary | ICD-10-CM | POA: Diagnosis not present

## 2014-09-08 DIAGNOSIS — M961 Postlaminectomy syndrome, not elsewhere classified: Secondary | ICD-10-CM | POA: Diagnosis not present

## 2014-09-19 DIAGNOSIS — H2513 Age-related nuclear cataract, bilateral: Secondary | ICD-10-CM | POA: Diagnosis not present

## 2014-09-19 DIAGNOSIS — H2589 Other age-related cataract: Secondary | ICD-10-CM | POA: Diagnosis not present

## 2014-09-19 DIAGNOSIS — H25013 Cortical age-related cataract, bilateral: Secondary | ICD-10-CM | POA: Diagnosis not present

## 2014-09-19 DIAGNOSIS — H40013 Open angle with borderline findings, low risk, bilateral: Secondary | ICD-10-CM | POA: Diagnosis not present

## 2014-10-04 DIAGNOSIS — H40011 Open angle with borderline findings, low risk, right eye: Secondary | ICD-10-CM | POA: Diagnosis not present

## 2014-10-11 DIAGNOSIS — H2589 Other age-related cataract: Secondary | ICD-10-CM | POA: Diagnosis not present

## 2014-10-11 DIAGNOSIS — H40012 Open angle with borderline findings, low risk, left eye: Secondary | ICD-10-CM | POA: Diagnosis not present

## 2014-10-11 DIAGNOSIS — H40013 Open angle with borderline findings, low risk, bilateral: Secondary | ICD-10-CM | POA: Diagnosis not present

## 2014-10-17 DIAGNOSIS — I10 Essential (primary) hypertension: Secondary | ICD-10-CM | POA: Diagnosis not present

## 2014-10-17 DIAGNOSIS — Z6831 Body mass index (BMI) 31.0-31.9, adult: Secondary | ICD-10-CM | POA: Diagnosis not present

## 2014-10-17 DIAGNOSIS — Z1389 Encounter for screening for other disorder: Secondary | ICD-10-CM | POA: Diagnosis not present

## 2014-10-17 DIAGNOSIS — E669 Obesity, unspecified: Secondary | ICD-10-CM | POA: Diagnosis not present

## 2014-10-17 DIAGNOSIS — Z23 Encounter for immunization: Secondary | ICD-10-CM | POA: Diagnosis not present

## 2014-10-17 DIAGNOSIS — M5136 Other intervertebral disc degeneration, lumbar region: Secondary | ICD-10-CM | POA: Diagnosis not present

## 2014-11-24 ENCOUNTER — Encounter (INDEPENDENT_AMBULATORY_CARE_PROVIDER_SITE_OTHER): Payer: Medicare Other | Admitting: Ophthalmology

## 2014-11-24 DIAGNOSIS — H43813 Vitreous degeneration, bilateral: Secondary | ICD-10-CM | POA: Diagnosis not present

## 2014-11-24 DIAGNOSIS — H35033 Hypertensive retinopathy, bilateral: Secondary | ICD-10-CM | POA: Diagnosis not present

## 2014-11-24 DIAGNOSIS — I10 Essential (primary) hypertension: Secondary | ICD-10-CM

## 2014-11-24 DIAGNOSIS — H35371 Puckering of macula, right eye: Secondary | ICD-10-CM

## 2014-11-27 DIAGNOSIS — E785 Hyperlipidemia, unspecified: Secondary | ICD-10-CM | POA: Diagnosis not present

## 2014-11-27 DIAGNOSIS — Z008 Encounter for other general examination: Secondary | ICD-10-CM | POA: Diagnosis not present

## 2014-11-27 DIAGNOSIS — Z125 Encounter for screening for malignant neoplasm of prostate: Secondary | ICD-10-CM | POA: Diagnosis not present

## 2014-11-27 DIAGNOSIS — I10 Essential (primary) hypertension: Secondary | ICD-10-CM | POA: Diagnosis not present

## 2014-12-04 DIAGNOSIS — Z1212 Encounter for screening for malignant neoplasm of rectum: Secondary | ICD-10-CM | POA: Diagnosis not present

## 2014-12-04 DIAGNOSIS — M5136 Other intervertebral disc degeneration, lumbar region: Secondary | ICD-10-CM | POA: Diagnosis not present

## 2014-12-04 DIAGNOSIS — I10 Essential (primary) hypertension: Secondary | ICD-10-CM | POA: Diagnosis not present

## 2014-12-04 DIAGNOSIS — Z0000 Encounter for general adult medical examination without abnormal findings: Secondary | ICD-10-CM | POA: Diagnosis not present

## 2014-12-04 DIAGNOSIS — E785 Hyperlipidemia, unspecified: Secondary | ICD-10-CM | POA: Diagnosis not present

## 2014-12-04 DIAGNOSIS — E669 Obesity, unspecified: Secondary | ICD-10-CM | POA: Diagnosis not present

## 2014-12-04 DIAGNOSIS — Z6831 Body mass index (BMI) 31.0-31.9, adult: Secondary | ICD-10-CM | POA: Diagnosis not present

## 2014-12-04 DIAGNOSIS — M25561 Pain in right knee: Secondary | ICD-10-CM | POA: Diagnosis not present

## 2014-12-22 DIAGNOSIS — K640 First degree hemorrhoids: Secondary | ICD-10-CM | POA: Diagnosis not present

## 2014-12-22 DIAGNOSIS — D123 Benign neoplasm of transverse colon: Secondary | ICD-10-CM | POA: Diagnosis not present

## 2014-12-22 DIAGNOSIS — Z09 Encounter for follow-up examination after completed treatment for conditions other than malignant neoplasm: Secondary | ICD-10-CM | POA: Diagnosis not present

## 2014-12-22 DIAGNOSIS — Z86010 Personal history of colonic polyps: Secondary | ICD-10-CM | POA: Diagnosis not present

## 2015-02-09 DIAGNOSIS — H2513 Age-related nuclear cataract, bilateral: Secondary | ICD-10-CM | POA: Diagnosis not present

## 2015-02-09 DIAGNOSIS — H35033 Hypertensive retinopathy, bilateral: Secondary | ICD-10-CM | POA: Diagnosis not present

## 2015-02-09 DIAGNOSIS — H40013 Open angle with borderline findings, low risk, bilateral: Secondary | ICD-10-CM | POA: Diagnosis not present

## 2015-02-09 DIAGNOSIS — H25013 Cortical age-related cataract, bilateral: Secondary | ICD-10-CM | POA: Diagnosis not present

## 2015-02-09 DIAGNOSIS — H35371 Puckering of macula, right eye: Secondary | ICD-10-CM | POA: Diagnosis not present

## 2015-03-03 DIAGNOSIS — H2511 Age-related nuclear cataract, right eye: Secondary | ICD-10-CM | POA: Diagnosis not present

## 2015-03-25 DIAGNOSIS — H25012 Cortical age-related cataract, left eye: Secondary | ICD-10-CM | POA: Diagnosis not present

## 2015-03-25 DIAGNOSIS — H2512 Age-related nuclear cataract, left eye: Secondary | ICD-10-CM | POA: Diagnosis not present

## 2015-03-31 DIAGNOSIS — H2512 Age-related nuclear cataract, left eye: Secondary | ICD-10-CM | POA: Diagnosis not present

## 2015-05-25 ENCOUNTER — Ambulatory Visit (INDEPENDENT_AMBULATORY_CARE_PROVIDER_SITE_OTHER): Payer: Medicare Other | Admitting: Ophthalmology

## 2015-05-25 DIAGNOSIS — H35371 Puckering of macula, right eye: Secondary | ICD-10-CM

## 2015-05-25 DIAGNOSIS — H35033 Hypertensive retinopathy, bilateral: Secondary | ICD-10-CM

## 2015-05-25 DIAGNOSIS — H43813 Vitreous degeneration, bilateral: Secondary | ICD-10-CM

## 2015-05-25 DIAGNOSIS — I10 Essential (primary) hypertension: Secondary | ICD-10-CM

## 2015-07-13 ENCOUNTER — Encounter (INDEPENDENT_AMBULATORY_CARE_PROVIDER_SITE_OTHER): Payer: Medicare Other | Admitting: Ophthalmology

## 2015-07-13 DIAGNOSIS — H59031 Cystoid macular edema following cataract surgery, right eye: Secondary | ICD-10-CM | POA: Diagnosis not present

## 2015-07-13 DIAGNOSIS — H43813 Vitreous degeneration, bilateral: Secondary | ICD-10-CM | POA: Diagnosis not present

## 2015-07-13 DIAGNOSIS — H35371 Puckering of macula, right eye: Secondary | ICD-10-CM | POA: Diagnosis not present

## 2015-07-13 DIAGNOSIS — I10 Essential (primary) hypertension: Secondary | ICD-10-CM | POA: Diagnosis not present

## 2015-07-13 DIAGNOSIS — H35033 Hypertensive retinopathy, bilateral: Secondary | ICD-10-CM | POA: Diagnosis not present

## 2015-08-01 DIAGNOSIS — Z23 Encounter for immunization: Secondary | ICD-10-CM | POA: Diagnosis not present

## 2015-08-14 DIAGNOSIS — I2699 Other pulmonary embolism without acute cor pulmonale: Secondary | ICD-10-CM | POA: Diagnosis not present

## 2015-08-14 DIAGNOSIS — R0609 Other forms of dyspnea: Secondary | ICD-10-CM | POA: Diagnosis not present

## 2015-08-14 DIAGNOSIS — I10 Essential (primary) hypertension: Secondary | ICD-10-CM | POA: Diagnosis not present

## 2015-08-14 DIAGNOSIS — Z6831 Body mass index (BMI) 31.0-31.9, adult: Secondary | ICD-10-CM | POA: Diagnosis not present

## 2015-08-14 DIAGNOSIS — M0609 Rheumatoid arthritis without rheumatoid factor, multiple sites: Secondary | ICD-10-CM | POA: Diagnosis not present

## 2015-08-17 ENCOUNTER — Other Ambulatory Visit: Payer: Self-pay | Admitting: Internal Medicine

## 2015-08-17 DIAGNOSIS — I2699 Other pulmonary embolism without acute cor pulmonale: Secondary | ICD-10-CM

## 2015-08-17 DIAGNOSIS — I10 Essential (primary) hypertension: Secondary | ICD-10-CM | POA: Diagnosis not present

## 2015-08-18 ENCOUNTER — Ambulatory Visit
Admission: RE | Admit: 2015-08-18 | Discharge: 2015-08-18 | Disposition: A | Discharge status: Home / Self Care | Payer: Medicare Other | Source: Ambulatory Visit | Attending: Internal Medicine | Admitting: Internal Medicine

## 2015-08-18 DIAGNOSIS — I2699 Other pulmonary embolism without acute cor pulmonale: Secondary | ICD-10-CM

## 2015-08-18 DIAGNOSIS — H40013 Open angle with borderline findings, low risk, bilateral: Secondary | ICD-10-CM | POA: Diagnosis not present

## 2015-08-18 DIAGNOSIS — R0602 Shortness of breath: Secondary | ICD-10-CM | POA: Diagnosis not present

## 2015-08-18 DIAGNOSIS — Z961 Presence of intraocular lens: Secondary | ICD-10-CM | POA: Diagnosis not present

## 2015-08-18 MED ORDER — IOPAMIDOL (ISOVUE-370) INJECTION 76%
100.0000 mL | Freq: Once | INTRAVENOUS | Status: AC | PRN
  Administered 2015-08-18: 100 mL via INTRAVENOUS

## 2015-09-15 ENCOUNTER — Telehealth: Payer: Self-pay | Admitting: Cardiovascular Disease

## 2015-09-15 NOTE — Telephone Encounter (Signed)
Received records from [facility] for appointment on 10/02/15 with Dr Oval Linsey.  Records given to [facility] (medical records) for Dr Blenda Mounts schedule on 10/02/15. lp

## 2015-10-02 ENCOUNTER — Ambulatory Visit (INDEPENDENT_AMBULATORY_CARE_PROVIDER_SITE_OTHER): Payer: Medicare Other | Admitting: Cardiovascular Disease

## 2015-10-02 ENCOUNTER — Encounter: Payer: Self-pay | Admitting: Cardiovascular Disease

## 2015-10-02 VITALS — BP 136/78 | HR 81 | Ht 68.0 in | Wt 209.0 lb

## 2015-10-02 DIAGNOSIS — R079 Chest pain, unspecified: Secondary | ICD-10-CM | POA: Diagnosis not present

## 2015-10-02 DIAGNOSIS — R0602 Shortness of breath: Secondary | ICD-10-CM

## 2015-10-02 DIAGNOSIS — R0789 Other chest pain: Secondary | ICD-10-CM | POA: Diagnosis not present

## 2015-10-02 DIAGNOSIS — I10 Essential (primary) hypertension: Secondary | ICD-10-CM | POA: Diagnosis not present

## 2015-10-02 HISTORY — DX: Chest pain, unspecified: R07.9

## 2015-10-02 NOTE — Patient Instructions (Addendum)
Medication Instructions:  Please continue your current medications  Labwork: NONE  Testing/Procedures: 1. 2D Echocardiogram - Your physician has requested that you have an echocardiogram. Echocardiography is a painless test that uses sound waves to create images of your heart. It provides your doctor with information about the size and shape of your heart and how well your heart's chambers and valves are working. This procedure takes approximately one hour. There are no restrictions for this procedure.  [place] physician has requested that you have a lexiscan myoview. For further information please visit [email or web address]. Please follow instruction sheet, as given.  3. Pulmonary Function Tests - Your physician has recommended that you have a pulmonary function test. Pulmonary Function Tests are a group of tests that measure how well air moves in and out of your lungs.  Follow-Up: Dr Oval Linsey recommends that you schedule a follow-up appointment in 3 months.  If you need a refill on your cardiac medications before your next appointment, please call your pharmacy.  Pulmonary Function Tests Pulmonary function tests (PFTs) measure how well your lungs are working. The tests can help to identify the causes of lung problems. They can also help your health care provider select the best treatment for you. Your health care provider may order pulmonary function for any of the following reasons:  When an illness involving the lungs is suspected.  To follow changes in your lung function over time if you are known to have a chronic lung disease.  For industrial plant workers to examine the effects of being exposed to chemicals over a long period of time.  To assess lung function prior to surgery or other procedures.  For people who are smokers. Your measured lung function will be compared to the expected lung function of someone with healthy lungs who is similar to you in age,  gender, size, and other factors. This is used to determine your "percent predicted" lung function, which is how your health care provider knows if your lung function is normal or abnormal. If you have had prior pulmonary function testing performed, your health care provider will also compare your current results with past tests to see if your lung function is better, worse, or staying the same. This can sometimes be useful to see if treatments are working.  LET [facility] CARE PROVIDER KNOW ABOUT:  Any allergies you have.  All medicines you are taking, including inhaler or nebulizer medicines, vitamins, herbs, eye drops, creams, and over-the-counter medicines.  Any blood disorders you have.  Previous surgeries you have had, especially recent eye surgery, abdominal surgery, or chest surgery. These can make performing pulmonary function tests difficult or unsafe.  Medical conditions you have.  Chest pain or heart problems.  Tuberculosis or respiratory infections, such as pneumonia, a cold, or the flu. If you think you will have difficulty performing any of the breathing maneuvers, ask your health care provider if you should reschedule the test. RISKS AND COMPLICATIONS: Generally, pulmonary function testing is a safe procedure. However, as with any procedure, complications can occur. Possible complications include:  Lightheadedness due to overbreathing (hyperventilation).  An asthmatic attack from deep breathing. BEFORE THE PROCEDURE  Take medicine as directed by your health care provider. If you take inhaler or nebulizer medicines, ask your health care provider which medicines you should take on the day of your test. Some inhaler medicines may interfere with pulmonary function tests, such as bronchodilator testing, if taken shortly before the test.  Avoid eating  a large meal before your test.  Do not smoke before your test.  Wear comfortable clothing which will not interfere with  breathing. PROCEDURE  You will be given a soft nose clip to wear during the procedure. This is done so that all of your breaths will go through your mouth instead of your nose.  You will be given a germ-free (sterile) mouthpiece. It will be attached to a spirometer. The spirometer is the machine that measures your breathing.  You will be instructed to perform various breathing maneuvers. The maneuvers will be done by breathing in (inhaling) and breathing out (exhaling). Depending on what measurements are ordered, you may be asked to repeat the maneuvers several times before the test is completed.  It is important to follow the instructions exactly to obtain accurate results. Make sure to blow as hard and as fast as you can when you are instructed to do so.  You may be given a bronchodilator after testing has been performed. A bronchodilator is a medicine which makes the small air passages in your lungs larger. These medicines usually make it easier to breathe. The tests are then repeated several minutes later after the bronchodilator has taken effect.  You will be monitored carefully during the procedure for faintness, dizziness, difficulty breathing, or any other problems. AFTER THE PROCEDURE   You may resume your usual diet, medicines, and activities as directed by your health care provider.  Your health care provider will go over your test results with you and determine what treatments may be helpful.   This information is not intended to replace advice given to you by your health care provider. Make sure you discuss any questions you have with your health care provider.   Document Released: 05/12/2004 Document Revised: 07/10/2013 Document Reviewed: 04/18/2013 Elsevier Interactive Patient Education 2016 [employer].   Echocardiogram An echocardiogram, or echocardiography, uses sound waves (ultrasound) to produce an image of your heart. The echocardiogram is simple, painless, obtained  within a short period of time, and offers valuable information to your health care provider. The images from an echocardiogram can provide information such as:  Evidence of coronary artery disease (CAD).  Heart size.  Heart muscle function.  Heart valve function.  Aneurysm detection.  Evidence of a past heart attack.  Fluid buildup around the heart.  Heart muscle thickening.  Assess heart valve function. LET [facility] CARE PROVIDER KNOW ABOUT:  Any allergies you have.  All medicines you are taking, including vitamins, herbs, eye drops, creams, and over-the-counter medicines.  Previous problems you or members of your family have had with the use of anesthetics.  Any blood disorders you have.  Previous surgeries you have had.  Medical conditions you have.  Possibility of pregnancy, if this applies. BEFORE THE PROCEDURE  No special preparation is needed. Eat and drink normally.  PROCEDURE   In order to produce an image of your heart, gel will be applied to your chest and a wand-like tool (transducer) will be moved over your chest. The gel will help transmit the sound waves from the transducer. The sound waves will harmlessly bounce off your heart to allow the heart images to be captured in real-time motion. These images will then be recorded.  You may need an IV to receive a medicine that improves the quality of the pictures. AFTER THE PROCEDURE You may return to your normal schedule including diet, activities, and medicines, unless your health care provider tells you otherwise.   This information  is not intended to replace advice given to you by your health care provider. Make sure you discuss any questions you have with your health care provider.   Document Released: 09/16/2000 Document Revised: 10/10/2014 Document Reviewed: 05/27/2013 Elsevier Interactive Patient Education 2016 [employer].   Pharmacologic Stress Electrocardiogram A pharmacologic stress  electrocardiogram is a heart (cardiac) test that uses nuclear imaging to evaluate the blood supply to your heart. This test may also be called a pharmacologic stress electrocardiography. Pharmacologic means that a medicine is used to increase your heart rate and blood pressure.  This stress test is done to find areas of poor blood flow to the heart by determining the extent of coronary artery disease (CAD). Some people exercise on a treadmill, which naturally increases the blood flow to the heart. For those people unable to exercise on a treadmill, a medicine is used. This medicine stimulates your heart and will cause your heart to beat harder and more quickly, as if you were exercising.  Pharmacologic stress tests can help determine:  The adequacy of blood flow to your heart during increased levels of activity in order to clear you for discharge home.  The extent of coronary artery blockage caused by CAD.  Your prognosis if you have suffered a heart attack.  The effectiveness of cardiac procedures done, such as an angioplasty, which can increase the circulation in your coronary arteries.  Causes of chest pain or pressure. LET [facility] CARE PROVIDER KNOW ABOUT:  Any allergies you have.  All medicines you are taking, including vitamins, herbs, eye drops, creams, and over-the-counter medicines.  Previous problems you or members of your family have had with the use of anesthetics.  Any blood disorders you have.  Previous surgeries you have had.  Medical conditions you have.  Possibility of pregnancy, if this applies.  If you are currently breastfeeding. RISKS AND COMPLICATIONS Generally, this is a safe procedure. However, as with any procedure, complications can occur. Possible complications include:  You develop pain or pressure in the following areas:  Chest.  Jaw or neck.  Between your shoulder blades.  Radiating down your left arm.  Headache.  Dizziness or  light-headedness.  Shortness of breath.  Increased or irregular heartbeat.  Low blood pressure.  Nausea or vomiting.  Flushing.  Redness going up the arm and slight pain during injection of medicine.  Heart attack (rare). BEFORE THE PROCEDURE   Avoid all forms of caffeine for 24 hours before your test or as directed by your health care provider. This includes coffee, tea (even decaffeinated tea), caffeinated sodas, chocolate, cocoa, and certain pain medicines.  Follow your health care provider's instructions regarding eating and drinking before the test.  Take your medicines as directed at regular times with water unless instructed otherwise. Exceptions may include:  If you have diabetes, ask how you are to take your insulin or pills. It is common to adjust insulin dosing the morning of the test.  If you are taking beta-blocker medicines, it is important to talk to your health care provider about these medicines well before the date of your test. Taking beta-blocker medicines may interfere with the test. In some cases, these medicines need to be changed or stopped 24 hours or more before the test.  If you wear a nitroglycerin patch, it may need to be removed prior to the test. Ask your health care provider if the patch should be removed before the test.  If you use an inhaler for any breathing  condition, bring it with you to the test.  If you are an outpatient, bring a snack so you can eat right after the stress phase of the test.  Do not smoke for 4 hours prior to the test or as directed by your health care provider.  Do not apply lotions, powders, creams, or oils on your chest prior to the test.  Wear comfortable shoes and clothing. Let your health care provider know if you were unable to complete or follow the preparations for your test. PROCEDURE   Multiple patches (electrodes) will be put on your chest. If needed, small areas of your chest may be shaved to get better  contact with the electrodes. Once the electrodes are attached to your body, multiple wires will be attached to the electrodes, and your heart rate will be monitored.  An IV access will be started. A nuclear trace (isotope) is given. The isotope may be given intravenously, or it may be swallowed. Nuclear refers to several types of radioactive isotopes, and the nuclear isotope lights up the arteries so that the nuclear images are clear. The isotope is absorbed by your body. This results in low radiation exposure.  A resting nuclear image is taken to show how your heart functions at rest.  A medicine is given through the IV access.  A second scan is done about 1 hour after the medicine injection and determines how your heart functions under stress.  During this stress phase, you will be connected to an electrocardiogram machine. Your blood pressure and oxygen levels will be monitored. AFTER THE PROCEDURE   Your heart rate and blood pressure will be monitored after the test.  You may return to your normal schedule, including diet,activities, and medicines, unless your health care provider tells you otherwise.   This information is not intended to replace advice given to you by your health care provider. Make sure you discuss any questions you have with your health care provider.   Document Released: 02/05/2009 Document Revised: 09/24/2013 Document Reviewed: 05/27/2013 Elsevier Interactive Patient Education [employer].

## 2015-10-02 NOTE — Progress Notes (Signed)
Cardiology Office Note   Date:  10/02/2015   ID:  Frank Johnston, DOB Nov 23, 1940, MRN [id number]  PCP:  Frank Lopes, MD  Cardiologist:   Frank Harness, MD   Chief Complaint  Patient presents with  . New Evaluation  . Shortness of Breath    on exertion      History of Present Illness: Frank Johnston is a 74 y.o. male with hypertension and prior pulmonary embolism who presents for evaluation of dyspnea on exertion.  [name] was seen by his PCP, Frank Johnston, M.D., on 08/14/15. At that appointment he complained of dyspnea with minimal exertion. He was noted to have trace edema on exam.He was referred for CXR that showed mild hyperexpansion but no active disease.  BNP was 17. D-dimer was elevated at 1.6.  He was referred for CT angiography of the chest that was negative for pulmonary embolus The prior left lower lobe pulmonary embolism had resolved and his pulmonary arteries were normal in size.  No coronary calcifications were noted.  Mr. Frank Johnston has note exertional shortness of breath with minimal exertion.  He has also noted chest pain.  He continues to have SOB but the chest pain has resolved.  The chest pressure episodes occurred with carrying the garbage cans to the driveway.  It lasted for several hours at a time and was relieved by hydrocodone.   He denies lower extremity edema, orthopnea, PND, lightheadedness or dizziness. He does complain of R knee and leg pain. He previously injured his right knee, which was the cause of his DVT.  He no longer has exertional chest pain, though he has been limiting his activities to avoid it.  Mr. Frank Johnston quit smoking in 1996 after a 40 pack year smoking history.   Past Medical History  Diagnosis Date  . Hypertension   . GERD (gastroesophageal reflux disease)   . Arthritis   . Chronic kidney disease     hx kidney stones  . H/O hiatal hernia   . Wears glasses   . Chest pain 10/02/2015    Past Surgical  History  Procedure Laterality Date  . Joint replacement  4/12    lt total knee  . Knee closed reduction  6/12    lt -after lt total knee  . Cholecystectomy  1976  . Appendectomy  1976  . Knee arthroscopy      rt and lt  . Colonoscopy    . Back surgery  1984    lumbar lam  . Carpal tunnel release  12/06/2011    Procedure: CARPAL TUNNEL RELEASE;  Surgeon: Wynonia Sours, MD;  Location: [place];  Service: Orthopedics;  Laterality: Right;  . Trigger finger release  12/06/2011    Procedure: RELEASE TRIGGER FINGER/A-1 PULLEY;  Surgeon: Wynonia Sours, MD;  Location: [place];  Service: Orthopedics;  Laterality: Right;  right thumb, middle, and ring fingers  . Carpal tunnel release  08/07/2012    Procedure: CARPAL TUNNEL RELEASE;  Surgeon: Wynonia Sours, MD;  Location: [place];  Service: Orthopedics;  Laterality: Left;  carpal tunnel release  . Trigger finger release  08/07/2012    Procedure: RELEASE TRIGGER FINGER/A-1 PULLEY;  Surgeon: Wynonia Sours, MD;  Location: [place];  Service: Orthopedics;  Laterality: Left;  trigger release left ring finger     Current Outpatient Prescriptions  Medication Sig Dispense Refill  . fluticasone (FLONASE) 50 MCG/ACT nasal spray Place  2 sprays into the nose as needed.    Marland Kitchen HYDROcodone-acetaminophen (NORCO) 5-325 MG per tablet Take 1 tablet by mouth every 6 (six) hours as needed for pain. 30 tablet 0  . metoCLOPramide (REGLAN) 5 MG tablet Take 5 mg by mouth daily.     Marland Kitchen omeprazole (PRILOSEC) 40 MG capsule Take 40 mg by mouth every evening.    . senna-docusate (SENOKOT-S) 8.6-50 MG per tablet Take 1 tablet by mouth at bedtime as needed for mild constipation. 30 tablet 1  . therapeutic multivitamin-minerals (THERAGRAN-M) tablet Take 1 tablet by mouth daily.    . valsartan-hydrochlorothiazide (DIOVAN-HCT) 320-12.5 MG per tablet Take 1 tablet by mouth daily.    . vitamin E (VITAMIN E) 400 UNIT capsule  Take 400 Units by mouth daily.     No current facility-administered medications for this visit.    Allergies:   Review of patient's allergies indicates no known allergies.    Social History:  The patient  reports that he quit smoking about 20 years ago. He does not have any smokeless tobacco history on file. He reports that he does not drink alcohol or use illicit drugs.   Family History:  The patient's family history is not on file.    ROS:  Please see the history of present illness.   Otherwise, review of systems are positive for none.   All other systems are reviewed and negative.    PHYSICAL EXAM: VS:  BP 136/78 mmHg  Pulse 81  Ht 5\' 8"  (1.727 m)  Wt 94.802 kg (209 lb)  BMI 31.79 kg/m2 , BMI Body mass index is 31.79 kg/(m^2). GENERAL:  Well appearing HEENT:  Pupils equal round and reactive, fundi not visualized, oral mucosa unremarkable NECK:  No jugular venous distention, waveform within normal limits, carotid upstroke brisk and symmetric, no bruits, no thyromegaly LYMPHATICS:  No cervical adenopathy LUNGS:  Clear to auscultation bilaterally HEART:  RRR.  PMI not displaced or sustained,S1 and S2 within normal limits, no S3, no S4, no clicks, no rubs, no murmurs.  Distant heart sounds distant heart sounds ABD:  Flat, positive bowel sounds normal in frequency in pitch, no bruits, no rebound, no guarding, no midline pulsatile mass, no hepatomegaly, no splenomegaly EXT:  2 plus pulses throughout, no edema, no cyanosis no clubbing SKIN:  No rashes no nodules NEURO:  Cranial nerves II through XII grossly intact, motor grossly intact throughout PSYCH:  Cognitively intact, oriented to person place and time    EKG:  EKG is ordered today. The ekg ordered today demonstrates inus rhythm rate 81 bpm. LAFB.1 PVC poor R-wave progression  Recent Labs: No results found for requested labs within last 365 days.    08/15/15: BNP 17.2  D-dimer 1.6 (normal 0-0.49) WBC 10.2, hemoglobin  16.3, hematocrit 46.3, platelets 225  sodium 140, Potassium 3.8, BUN 20, creatinine 1.1, glucose 112  Lipid Panel No results found for: CHOL, TRIG, HDL, CHOLHDL, VLDL, LDLCALC, LDLDIRECT    Wt Readings from Last 3 Encounters:  10/02/15 94.802 kg (209 lb)  03/01/14 91.3 kg (201 lb 4.5 oz)  08/07/12 93.951 kg (207 lb 2 oz)      ASSESSMENT AND PLAN:  # Chest pain: Mr. Gravett symptoms are concerning for ischemia. He did not have any coronary calcification on non-gated CT, which makes obstructive coronary disease less likely but not impossible. He has a long smoking history, age, as well as hypertension as risk factors.  He is due for repeat lipid testing with his  PCP in March.  # Shortness of breath:  Heart failure is unlikely given his clinical exam and BNP of 17. We will obtain an echo to evaluate for pulmonary hypertension as well as pulmonary function testing to evaluate for COPD.  Ischemia evaluation as above.  # Hypertension: Blood pressure well-controlled.  Continue losartan and HCTZ   Current medicines are reviewed at length with the patient today.  The patient does not have concerns regarding medicines.  The following changes have been made:  no change  Labs/ tests ordered today include:   Orders Placed This Encounter  Procedures  . Myocardial Perfusion Imaging  . EKG 12-Lead  . ECHOCARDIOGRAM COMPLETE  . Pulmonary Function Test     Disposition:   FU with Breylin Dom C. Oval Linsey, MD, [facility] in 3 months.    Signed, Kemonte Ullman C. Oval Linsey, MD, [facility]  10/02/2015 12:07 PM    [place]

## 2015-10-06 ENCOUNTER — Encounter (HOSPITAL_COMMUNITY): Payer: Medicare Other

## 2015-10-08 ENCOUNTER — Telehealth (HOSPITAL_COMMUNITY): Payer: Self-pay | Admitting: *Deleted

## 2015-10-08 NOTE — Telephone Encounter (Signed)
Patient given detailed instructions per Myocardial Perfusion Study Information Sheet for the test on 10/14/15 at 10:00. Patient notified to arrive 15 minutes early and that it is imperative to arrive on time for appointment to keep from having the test rescheduled. ° If you need to cancel or reschedule your appointment, please call the office within 24 hours of your appointment. Failure to do so may result in a cancellation of your appointment, and a $50 no show fee. Patient verbalized understanding.Frank Johnston ° ° ° °

## 2015-10-09 ENCOUNTER — Encounter: Payer: Self-pay | Admitting: Cardiovascular Disease

## 2015-10-09 DIAGNOSIS — J209 Acute bronchitis, unspecified: Secondary | ICD-10-CM | POA: Diagnosis not present

## 2015-10-09 DIAGNOSIS — R05 Cough: Secondary | ICD-10-CM | POA: Diagnosis not present

## 2015-10-09 DIAGNOSIS — Z6831 Body mass index (BMI) 31.0-31.9, adult: Secondary | ICD-10-CM | POA: Diagnosis not present

## 2015-10-09 DIAGNOSIS — I10 Essential (primary) hypertension: Secondary | ICD-10-CM | POA: Diagnosis not present

## 2015-10-14 ENCOUNTER — Other Ambulatory Visit (HOSPITAL_COMMUNITY): Payer: Medicare Other

## 2015-10-14 ENCOUNTER — Encounter (HOSPITAL_COMMUNITY): Payer: Medicare Other

## 2015-10-14 DIAGNOSIS — R509 Fever, unspecified: Secondary | ICD-10-CM | POA: Diagnosis not present

## 2015-11-06 ENCOUNTER — Ambulatory Visit (HOSPITAL_COMMUNITY)
Admission: RE | Admit: 2015-11-06 | Discharge: 2015-11-06 | Disposition: A | Discharge status: Home / Self Care | Payer: Medicare Other | Source: Ambulatory Visit | Attending: Cardiovascular Disease | Admitting: Cardiovascular Disease

## 2015-11-06 DIAGNOSIS — R0602 Shortness of breath: Secondary | ICD-10-CM

## 2015-11-06 LAB — PULMONARY FUNCTION TEST
DL/VA % pred: 93 %
DL/VA: 4.19 ml/min/mmHg/L
DLCO UNC % PRED: 77 %
DLCO UNC: 22.96 ml/min/mmHg
FEF 25-75 PRE: 1.54 L/s
FEF 25-75 Post: 1.44 L/sec
FEF2575-%Change-Post: -6 %
FEF2575-%Pred-Post: 71 %
FEF2575-%Pred-Pre: 76 %
FEV1-%CHANGE-POST: 0 %
FEV1-%PRED-POST: 87 %
FEV1-%PRED-PRE: 86 %
FEV1-POST: 2.43 L
FEV1-Pre: 2.42 L
FEV1FVC-%Change-Post: 0 %
FEV1FVC-%Pred-Pre: 98 %
FEV6-%CHANGE-POST: 0 %
FEV6-%PRED-POST: 90 %
FEV6-%Pred-Pre: 90 %
FEV6-POST: 3.28 L
FEV6-PRE: 3.27 L
FEV6FVC-%CHANGE-POST: 0 %
FEV6FVC-%PRED-POST: 103 %
FEV6FVC-%PRED-PRE: 103 %
FVC-%Change-Post: 0 %
FVC-%Pred-Post: 87 %
FVC-%Pred-Pre: 88 %
FVC-Post: 3.4 L
FVC-Pre: 3.41 L
POST FEV1/FVC RATIO: 71 %
PRE FEV6/FVC RATIO: 96 %
Post FEV6/FVC ratio: 96 %
Pre FEV1/FVC ratio: 71 %
RV % PRED: 105 %
RV: 2.61 L
TLC % PRED: 88 %
TLC: 5.89 L

## 2015-11-06 MED ORDER — ALBUTEROL SULFATE (2.5 MG/3ML) 0.083% IN NEBU
2.5000 mg | INHALATION_SOLUTION | Freq: Once | RESPIRATORY_TRACT | Status: AC
  Administered 2015-11-06: 2.5 mg via RESPIRATORY_TRACT

## 2015-11-11 ENCOUNTER — Telehealth (HOSPITAL_COMMUNITY): Payer: Self-pay | Admitting: *Deleted

## 2015-11-11 NOTE — Telephone Encounter (Signed)
Patient given detailed instructions per Myocardial Perfusion Study Information Sheet for the test on 11/16/15 at 1000. Patient notified to arrive 15 minutes early and that it is imperative to arrive on time for appointment to keep from having the test rescheduled.  If you need to cancel or reschedule your appointment, please call the office within 24 hours of your appointment. Failure to do so may result in a cancellation of your appointment, and a $50 no show fee. Patient verbalized understanding.Hubbard Robinson, RN

## 2015-11-16 ENCOUNTER — Ambulatory Visit (HOSPITAL_COMMUNITY): Payer: Medicare Other | Attending: Internal Medicine

## 2015-11-16 ENCOUNTER — Other Ambulatory Visit: Payer: Self-pay

## 2015-11-16 ENCOUNTER — Ambulatory Visit (HOSPITAL_BASED_OUTPATIENT_CLINIC_OR_DEPARTMENT_OTHER): Payer: Medicare Other

## 2015-11-16 DIAGNOSIS — R9439 Abnormal result of other cardiovascular function study: Secondary | ICD-10-CM | POA: Diagnosis not present

## 2015-11-16 DIAGNOSIS — R0602 Shortness of breath: Secondary | ICD-10-CM

## 2015-11-16 DIAGNOSIS — I517 Cardiomegaly: Secondary | ICD-10-CM | POA: Diagnosis not present

## 2015-11-16 DIAGNOSIS — I5189 Other ill-defined heart diseases: Secondary | ICD-10-CM | POA: Diagnosis not present

## 2015-11-16 DIAGNOSIS — I340 Nonrheumatic mitral (valve) insufficiency: Secondary | ICD-10-CM | POA: Insufficient documentation

## 2015-11-16 DIAGNOSIS — I10 Essential (primary) hypertension: Secondary | ICD-10-CM | POA: Diagnosis not present

## 2015-11-16 DIAGNOSIS — R079 Chest pain, unspecified: Secondary | ICD-10-CM

## 2015-11-16 DIAGNOSIS — R0609 Other forms of dyspnea: Secondary | ICD-10-CM | POA: Insufficient documentation

## 2015-11-16 DIAGNOSIS — Z87891 Personal history of nicotine dependence: Secondary | ICD-10-CM | POA: Insufficient documentation

## 2015-11-16 LAB — MYOCARDIAL PERFUSION IMAGING
CHL CUP NUCLEAR SDS: 5
CHL CUP RESTING HR STRESS: 80 {beats}/min
CSEPPHR: 100 {beats}/min
LV sys vol: 23 mL
LVDIAVOL: 64 mL
NUC STRESS TID: 0.94
RATE: 0.37
SRS: 11
SSS: 16

## 2015-11-16 MED ORDER — REGADENOSON 0.4 MG/5ML IV SOLN
0.4000 mg | Freq: Once | INTRAVENOUS | Status: AC
  Administered 2015-11-16: 0.4 mg via INTRAVENOUS

## 2015-11-16 MED ORDER — TECHNETIUM TC 99M SESTAMIBI GENERIC - CARDIOLITE
10.6000 | Freq: Once | INTRAVENOUS | Status: AC | PRN
  Administered 2015-11-16: 11 via INTRAVENOUS

## 2015-11-16 MED ORDER — TECHNETIUM TC 99M SESTAMIBI GENERIC - CARDIOLITE
30.0000 | Freq: Once | INTRAVENOUS | Status: AC | PRN
  Administered 2015-11-16: 30 via INTRAVENOUS

## 2015-11-20 ENCOUNTER — Telehealth: Payer: Self-pay | Admitting: *Deleted

## 2015-11-20 NOTE — Telephone Encounter (Signed)
-----   Message from Skeet Latch, MD sent at 11/16/2015  5:34 PM EST ----- This is a low risk study.

## 2015-11-20 NOTE — Telephone Encounter (Signed)
Spoke to patient. Echo , and stress test Result given . Verbalized understanding

## 2015-11-20 NOTE — Telephone Encounter (Signed)
-----   Message from Skeet Latch, MD sent at 11/18/2015  7:37 PM EST ----- Echo shows that his heart does not relax completely.  It will be important keep his blood pressure well-controlled.  This does not explain his shortness of breath.

## 2015-12-04 DIAGNOSIS — I10 Essential (primary) hypertension: Secondary | ICD-10-CM | POA: Diagnosis not present

## 2015-12-04 DIAGNOSIS — E785 Hyperlipidemia, unspecified: Secondary | ICD-10-CM | POA: Diagnosis not present

## 2015-12-04 DIAGNOSIS — Z125 Encounter for screening for malignant neoplasm of prostate: Secondary | ICD-10-CM | POA: Diagnosis not present

## 2015-12-11 DIAGNOSIS — R918 Other nonspecific abnormal finding of lung field: Secondary | ICD-10-CM | POA: Diagnosis not present

## 2015-12-11 DIAGNOSIS — E784 Other hyperlipidemia: Secondary | ICD-10-CM | POA: Diagnosis not present

## 2015-12-11 DIAGNOSIS — Z1212 Encounter for screening for malignant neoplasm of rectum: Secondary | ICD-10-CM | POA: Diagnosis not present

## 2015-12-11 DIAGNOSIS — Z6831 Body mass index (BMI) 31.0-31.9, adult: Secondary | ICD-10-CM | POA: Diagnosis not present

## 2015-12-11 DIAGNOSIS — M5136 Other intervertebral disc degeneration, lumbar region: Secondary | ICD-10-CM | POA: Diagnosis not present

## 2015-12-11 DIAGNOSIS — M25561 Pain in right knee: Secondary | ICD-10-CM | POA: Diagnosis not present

## 2015-12-11 DIAGNOSIS — Z1389 Encounter for screening for other disorder: Secondary | ICD-10-CM | POA: Diagnosis not present

## 2015-12-11 DIAGNOSIS — E668 Other obesity: Secondary | ICD-10-CM | POA: Diagnosis not present

## 2015-12-11 DIAGNOSIS — I10 Essential (primary) hypertension: Secondary | ICD-10-CM | POA: Diagnosis not present

## 2015-12-11 DIAGNOSIS — R0609 Other forms of dyspnea: Secondary | ICD-10-CM | POA: Diagnosis not present

## 2015-12-11 DIAGNOSIS — Z0000 Encounter for general adult medical examination without abnormal findings: Secondary | ICD-10-CM | POA: Diagnosis not present

## 2015-12-31 ENCOUNTER — Encounter: Payer: Self-pay | Admitting: Cardiovascular Disease

## 2015-12-31 ENCOUNTER — Ambulatory Visit (INDEPENDENT_AMBULATORY_CARE_PROVIDER_SITE_OTHER): Payer: Medicare Other | Admitting: Cardiovascular Disease

## 2015-12-31 VITALS — BP 134/88 | HR 86 | Ht 68.25 in | Wt 204.6 lb

## 2015-12-31 DIAGNOSIS — J449 Chronic obstructive pulmonary disease, unspecified: Secondary | ICD-10-CM

## 2015-12-31 DIAGNOSIS — R0602 Shortness of breath: Secondary | ICD-10-CM

## 2015-12-31 DIAGNOSIS — I10 Essential (primary) hypertension: Secondary | ICD-10-CM | POA: Diagnosis not present

## 2015-12-31 NOTE — Patient Instructions (Addendum)
Medication Instructions:  Your physician recommends that you continue on your current medications as directed. Please refer to the Current Medication list given to you today.  Labwork: none  Testing/Procedures: none  Follow-Up: Your physician wants you to follow-up in: 1 year ov You will receive a reminder letter in the mail two months in advance. If you don't receive a letter, please call our office to schedule the follow-up appointment.  Any Other Special Instructions Will Be Listed Below (If Applicable). Will arrange for you to see Dr Chase Caller the Pulmonologist   If you need a refill on your cardiac medications before your next appointment, please call your pharmacy.

## 2015-12-31 NOTE — Progress Notes (Signed)
Cardiology Office Note   Date:  01/02/2016   ID:  Frank Johnston, DOB Jan 05, 1941, MRN [id number]  PCP:  [name], MD  Cardiologist:   [name], MD   Chief Complaint  Patient presents with  . Follow-up    3 months  pt states no Sx. ---has had pneumonia and the flu since the first of the year      Patient ID: Frank Johnston is a 75 y.o. male with hypertension and prior pulmonary embolism who presents for follow up on dyspnea on exertion.    Interval History 12/31/15: After his last appointment Frank Johnston had a [employer] that revealed a mild fixed defect in the apical and inferoseptal regions. There was no ischemia identified he had a normal ejection fraction.  He also had an echo that revealed an EF of 55-60% without wall motion abnormalities.  He was noted to have grade 1 diastolic dysfunction and mild mitral regurgitation.  Frank Johnston had pneumonia in January and then had the flu 3 weeks ago.  His wife and mother in law also had the flu.  His breathing was better after the pneumonia but he has been feeling more short of breath since having the flu.  He was prescribed 2 inhalers that he used when he had pneumonia that helped his breathing significantly.  He had PFTs that showed mild obstructive lung disease.  Frank Johnston hasn't been walking much lately.  He continues to have exertional dyspnea. He denies lower extremity edema, orthopnea, PND or chest pain.   History of Present Illness 10/02/15: Frank Johnston was seen by his PCP, Frank Johnston, Frank.D., on 08/14/15. At that appointment he complained of dyspnea with minimal exertion. He was noted to have trace edema on exam.He was referred for CXR that showed mild hyperexpansion but no active disease.  BNP was 17. D-dimer was elevated at 1.6.  He was referred for CT angiography of the chest that was negative for pulmonary embolus The prior left lower lobe pulmonary embolism had resolved and his  pulmonary arteries were normal in size.  No coronary calcifications were noted.  Mr. Johnston has note exertional shortness of breath with minimal exertion.  He has also noted chest pain.  He continues to have SOB but the chest pain has resolved.  The chest pressure episodes occurred with carrying the garbage cans to the driveway.  It lasted for several hours at a time and was relieved by hydrocodone.   He denies lower extremity edema, orthopnea, PND, lightheadedness or dizziness. He does complain of R knee and leg pain. He previously injured his right knee, which was the cause of his DVT.  He no longer has exertional chest pain, though he has been limiting his activities to avoid it.  Frank Johnston quit smoking in 1996 after a 40 pack year smoking history.   Past Medical History  Diagnosis Date  . Hypertension   . GERD (gastroesophageal reflux disease)   . Arthritis   . Chronic kidney disease     hx kidney stones  . H/O hiatal hernia   . Wears glasses   . Chest pain 10/02/2015    Past Surgical History  Procedure Laterality Date  . Joint replacement  4/12    lt total knee  . Knee closed reduction  6/12    lt -after lt total knee  . Cholecystectomy  1976  . Appendectomy  1976  . Knee arthroscopy      rt and  lt  . Colonoscopy    . Back surgery  1984    lumbar lam  . Carpal tunnel release  12/06/2011    Procedure: CARPAL TUNNEL RELEASE;  Surgeon: Wynonia Sours, MD;  Location: [place];  Service: Orthopedics;  Laterality: Right;  . Trigger finger release  12/06/2011    Procedure: RELEASE TRIGGER FINGER/A-1 PULLEY;  Surgeon: Wynonia Sours, MD;  Location: [place];  Service: Orthopedics;  Laterality: Right;  right thumb, middle, and ring fingers  . Carpal tunnel release  08/07/2012    Procedure: CARPAL TUNNEL RELEASE;  Surgeon: Wynonia Sours, MD;  Location: [place];  Service: Orthopedics;  Laterality: Left;  carpal tunnel release  . Trigger  finger release  08/07/2012    Procedure: RELEASE TRIGGER FINGER/A-1 PULLEY;  Surgeon: Wynonia Sours, MD;  Location: [place];  Service: Orthopedics;  Laterality: Left;  trigger release left ring finger     Current Outpatient Prescriptions  Medication Sig Dispense Refill  . aspirin 81 MG tablet Take 81 mg by mouth daily.    . fluticasone (FLONASE) 50 MCG/ACT nasal spray Place 2 sprays into the nose as needed.    Marland Kitchen HYDROcodone-acetaminophen (NORCO) 5-325 MG per tablet Take 1 tablet by mouth every 6 (six) hours as needed for pain. 30 tablet 0  . metoCLOPramide (REGLAN) 5 MG tablet Take 5 mg by mouth daily.     Marland Kitchen omeprazole (PRILOSEC) 40 MG capsule Take 40 mg by mouth every evening.    . senna-docusate (SENOKOT-S) 8.6-50 MG per tablet Take 1 tablet by mouth at bedtime as needed for mild constipation. 30 tablet 1  . therapeutic multivitamin-minerals (THERAGRAN-Frank) tablet Take 1 tablet by mouth daily.    . valsartan-hydrochlorothiazide (DIOVAN-HCT) 320-12.5 MG per tablet Take 1 tablet by mouth daily.    . verapamil (CALAN-SR) 240 MG CR tablet Take 1 tablet by mouth daily.    . vitamin E (VITAMIN E) 400 UNIT capsule Take 400 Units by mouth daily.     No current facility-administered medications for this visit.    Allergies:   Review of patient's allergies indicates no known allergies.    Social History:  The patient  reports that he quit smoking about 21 years ago. He does not have any smokeless tobacco history on file. He reports that he does not drink alcohol or use illicit drugs.   Family History:  The patient's family history includes Cancer in his father; Hypertension in his child and child; Stroke in his mother.    ROS:  Please see the history of present illness.   Otherwise, review of systems are positive for none.   All other systems are reviewed and negative.    PHYSICAL EXAM: VS:  BP 134/88 mmHg  Pulse 86  Ht 5' 8.25" (1.734 Frank)  Wt 92.806 kg (204 lb 9.6 oz)  BMI  30.87 kg/m2 , BMI Body mass index is 30.87 kg/(Frank^2). GENERAL:  Well appearing HEENT:  Pupils equal round and reactive, fundi not visualized, oral mucosa unremarkable NECK:  No jugular venous distention, waveform within normal limits, carotid upstroke brisk and symmetric, no bruits LYMPHATICS:  No cervical adenopathy LUNGS:  Clear to auscultation bilaterally HEART:  RRR.  PMI not displaced or sustained,S1 and S2 within normal limits, no S3, no S4, no clicks, no rubs, no murmurs.  Distant heart sounds distant heart sounds ABD:  Obese, positive bowel sounds normal in frequency in pitch, no bruits, no rebound,  no guarding, no midline pulsatile mass, no hepatomegaly, no splenomegaly EXT:  2 plus pulses throughout, no edema, no cyanosis no clubbing SKIN:  No rashes no nodules NEURO:  Cranial nerves II through XII grossly intact, motor grossly intact throughout PSYCH:  Cognitively intact, oriented to person place and time  EKG:  EKG is not ordered today.  Recent Labs: No results found for requested labs within last 365 days.   08/15/15: BNP 17.2  D-dimer 1.6 (normal 0-0.49) WBC 10.2, hemoglobin 16.3, hematocrit 46.3, platelets 225  sodium 140, Potassium 3.8, BUN 20, creatinine 1.1, glucose 112  Lipid Panel No results found for: CHOL, TRIG, HDL, CHOLHDL, VLDL, LDLCALC, LDLDIRECT    Wt Readings from Last 3 Encounters:  12/31/15 92.806 kg (204 lb 9.6 oz)  11/16/15 94.802 kg (209 lb)  10/02/15 94.802 kg (209 lb)      ASSESSMENT AND PLAN:  # Chest pain: Stress pain was negative for ischemia. Symptoms have resolved.  # Shortness of breath:  Heart failure is unlikely given his clinical exam and BNP of 17. Echo showed only grade 1 diastolic dysfunction.  Given that pulmonary function testing showed mild obstruction we will refer him to pulmonology for further evaluation and care.  # Hypertension: Blood pressure well-controlled.  Continue valsartan and HCTZ  # CV Disease prevention: Mr.  Wasserman recently had his lipids checked with his PCP and they were reportedly at goal. Continue aspirin.    Current medicines are reviewed at length with the patient today.  The patient does not have concerns regarding medicines.  The following changes have been made:  no change  Labs/ tests ordered today include:   Orders Placed This Encounter  Procedures  . Ambulatory referral to Pulmonology     Disposition:   FU with Nalaysia Manganiello C. Oval Linsey, MD, [facility] in 1 year.   Signed, Nelwyn Hebdon C. Oval Linsey, MD, [facility]  01/02/2016 10:32 PM     [facility]

## 2016-01-02 ENCOUNTER — Encounter: Payer: Self-pay | Admitting: Cardiovascular Disease

## 2016-01-18 ENCOUNTER — Ambulatory Visit (INDEPENDENT_AMBULATORY_CARE_PROVIDER_SITE_OTHER): Payer: Medicare Other | Admitting: Ophthalmology

## 2016-01-18 DIAGNOSIS — H35033 Hypertensive retinopathy, bilateral: Secondary | ICD-10-CM | POA: Diagnosis not present

## 2016-01-18 DIAGNOSIS — I10 Essential (primary) hypertension: Secondary | ICD-10-CM

## 2016-01-18 DIAGNOSIS — H43813 Vitreous degeneration, bilateral: Secondary | ICD-10-CM | POA: Diagnosis not present

## 2016-01-18 DIAGNOSIS — H35371 Puckering of macula, right eye: Secondary | ICD-10-CM

## 2016-01-18 DIAGNOSIS — H59031 Cystoid macular edema following cataract surgery, right eye: Secondary | ICD-10-CM | POA: Diagnosis not present

## 2016-02-04 ENCOUNTER — Encounter: Payer: Self-pay | Admitting: Internal Medicine

## 2016-02-04 ENCOUNTER — Ambulatory Visit (INDEPENDENT_AMBULATORY_CARE_PROVIDER_SITE_OTHER): Payer: Medicare Other | Admitting: Internal Medicine

## 2016-02-04 ENCOUNTER — Other Ambulatory Visit: Payer: Self-pay | Admitting: Acute Care

## 2016-02-04 VITALS — BP 136/84 | HR 84 | Ht 68.0 in | Wt 208.2 lb

## 2016-02-04 DIAGNOSIS — R0600 Dyspnea, unspecified: Secondary | ICD-10-CM | POA: Diagnosis not present

## 2016-02-04 NOTE — Assessment & Plan Note (Signed)
Unexplained dyspnea with exertion ( yard work and climbing stairs.) Previous PE  No obvious Cardiogenic etiology Plan: We will walk you in the office today to see if you drop your oxygen level with walking. We will schedule you for a pulmonary stress test. Follow up with Dr. Chase Caller after pulmonary stress test in 4 weeks. Please contact office for sooner follow up if symptoms do not improve or worsen or seek emergency care

## 2016-02-04 NOTE — Patient Instructions (Addendum)
It is nice to meet you today. We will walk you in the office today to see if you drop your oxygen level with walking. We will schedule you for a pulmonary stress test. Follow up with Dr. Chase Caller after pulmonary stress test is complete. Please contact office for sooner follow up if symptoms do not improve or worsen or seek emergency care

## 2016-02-04 NOTE — Progress Notes (Signed)
Subjective:    Patient ID: Frank Johnston, male    DOB: 1941-05-22, 75 y.o.   MRN: [id number]  HPI  75 year old male former smoker who quit in 1996 ( 40 pack years) with HTN, prior Pulmonary Embolism who has been referred by cardiology for evaluation of unexplained dyspnea.  02/04/2016   Pt. Presents to the office for evaluation of dyspnea.Pt states he has dyspnea with exertion and with climbing steps.He states that this started after his DVT/ PE.in May of 2015.He is no longer on blood thinners. He stopped treatment in Nov. 2015. He did have a recurrence of pain,last November 2016. and was started on Xarelto for about 1 week until recurrent PE could be ruled out. He had  Flu and pneumonia Jan. 2017, and Feb. 2017. He was treated with antibiotic, prednisone, BD inhalers for both events. He did not require hospitalization. He states he is not back to baseline, and feels he is deconditioned. He does not take amiodarone, he does take Verapamil.He denies chest pain, fever, orthopnea, hemoptysis, leg or calf pain.   Tests/ Significant Events::  11/16/2015:Echo : EF 0000000, grade 1 diastolic dysfunction   Q000111Q: Myocardial Perfusion Study: There was no ST segment deviation noted during stress.  Arrhythmias during stress: occasional PVCs.  Arrhythmias during recovery: none.  Arrhythmias were not significant.   ECG was interpretable and conclusive.  BNP: 17  11/2015: PFT's Mild Obstruction,minimal small airway disease. Non-responsive to BD.  02/04/2016:Walk: No desaturations/ Peak HR: 102  Review of Systems Constitutional:   No  weight loss, night sweats,  Fevers, chills, fatigue, or  lassitude.  HEENT:   No headaches,  Difficulty swallowing,  Tooth/dental problems, or  Sore throat,                No sneezing, itching, ear ache, nasal congestion, post nasal drip,   CV:  No chest pain,  Orthopnea, PND, swelling in lower extremities, anasarca, dizziness, palpitations, syncope.   GI  No  heartburn, indigestion, abdominal pain, nausea, vomiting, diarrhea, change in bowel habits, loss of appetite, bloody stools.   Resp: +  shortness of breath with exertion not at rest.  No excess mucus, no productive cough,  No non-productive cough,  No coughing up of blood.  No change in color of mucus.  No wheezing.  No chest wall deformity  Skin: no rash or lesions.  GU: no dysuria, change in color of urine, no urgency or frequency.  No flank pain, no hematuria   MS:  No joint pain or swelling.  No decreased range of motion.  No back pain.  Psych:  No change in mood or affect. No depression or anxiety.  No memory loss.        Objective:   Physical Exam BP 136/84 mmHg  Pulse 84  Ht 5\' 8"  (1.727 m)  Wt 208 lb 3.2 oz (94.439 kg)  BMI 31.66 kg/m2  SpO2 97%  Physical Exam:  General- No distress,  A&Ox3, obese ENT: No sinus tenderness, TM clear, pale nasal mucosa, no oral exudate,no post nasal drip, no LAN Cardiac: S1, S2, regular rate and rhythm, no murmur Chest: No wheeze/ + right base crackles / no dullness; no accessory muscle use, no nasal flaring, no sternal retractions Abd.: Soft Non-tender Ext: No clubbing cyanosis, edema Neuro:  normal strength Skin: No rashes, warm and dry Psych: normal mood and behavior  [name], AGACNP-BC [facility] 02/04/2016    Assessment & Plan:  STAFF NOTE: I, Dr Ann Lions have personally reviewed patient's available data, including medical history, events of note, physical examination and test results as part of my evaluation. I have discussed with resident/NP and other care providers such as pharmacist, RN and RRT.  In addition,  I personally evaluated patient and elicited key findings of   S: Patient is a former smoker. Quit in 1996. 2 years ago in 2015 had pulmonary embolism. Follow-up CT scan of the chest November 2060 shows this is clear except for small area of scarring. He has had echocardiogram  recently that shows grade 1 diastolic dysfunction but no evidence of pulmonary hypertension. Nevertheless he is having dyspnea since his pulmonary embolism. It was getting worse but then he got admitted for flu/pneumonia and then it started getting better. Now he feels improved. Nevertheless when he does yard work or climb stairs he gets dyspneic. Relieved by rest. Walking desaturation test on an 85 feet 3 laps in the office: Did not desaturate. He is overweight. He is on calcium channel blocker. He does have hypertension.  O: Obese Clear to auscultation bilaterally  Pulmicort function tests is essentially normal except for DLCO 77% which is borderline normal/slightly low   Prior CT scan of the chest reviewed below this is not interstitial lung disease protocol they look fairly healthy lung parenchyma    A: Dyspnea: Likely due to obesity, diastolic dysfunction, physical deconditioning. Alternate differential diagnosis chronotropic incompetence due to verapamil   P: Cardiopulmonary stress test exercise-induced bronchospasm challenge and then return for follow-up   .  Rest per NP/medical resident whose note is outlined above and that I agree with    Dr. Brand Males, M.D., [facility].C.P Pulmonary and Critical Care Medicine Staff Physician [facility] Pager: [phone], If no answer or between  15:00h - 7:00h: call [phone]  02/04/2016 5:18 PM

## 2016-02-11 ENCOUNTER — Ambulatory Visit (HOSPITAL_COMMUNITY): Payer: Medicare Other | Attending: Internal Medicine

## 2016-02-11 DIAGNOSIS — R0600 Dyspnea, unspecified: Secondary | ICD-10-CM | POA: Insufficient documentation

## 2016-03-08 ENCOUNTER — Encounter: Payer: Self-pay | Admitting: Internal Medicine

## 2016-03-08 ENCOUNTER — Ambulatory Visit (INDEPENDENT_AMBULATORY_CARE_PROVIDER_SITE_OTHER): Payer: Medicare Other | Admitting: Internal Medicine

## 2016-03-08 VITALS — BP 136/74 | HR 81 | Ht 68.0 in | Wt 208.8 lb

## 2016-03-08 DIAGNOSIS — R0600 Dyspnea, unspecified: Secondary | ICD-10-CM | POA: Diagnosis not present

## 2016-03-08 LAB — NITRIC OXIDE: NITRIC OXIDE: 14

## 2016-03-08 NOTE — Progress Notes (Signed)
Subjective:     Patient ID: Frank Johnston, male   DOB: 07/19/1941, 75 y.o.   MRN: [id number]  PCP [name], MD  HPI  75 year old male former smoker who quit in 1996 ( 40 pack years) with HTN, prior Pulmonary Embolism who has been referred by cardiology for evaluation of unexplained dyspnea.  02/04/2016   Pt. Presents to the office for evaluation of dyspnea.Pt states he has dyspnea with exertion and with climbing steps.He states that this started after his DVT/ PE.in May of 2015.He is no longer on blood thinners. He stopped treatment in Nov. 2015. He did have a recurrence of pain,last November 2016. and was started on Xarelto for about 1 week until recurrent PE could be ruled out. He had  Flu and pneumonia Jan. 2017, and Feb. 2017. He was treated with antibiotic, prednisone, BD inhalers for both events. He did not require hospitalization. He states he is not back to baseline, and feels he is deconditioned. He does not take amiodarone, he does take Verapamil.He denies chest pain, fever, orthopnea, hemoptysis, leg or calf pain.   Tests/ Significant Events:  11/16/2015:Echo : EF 0000000, grade 1 diastolic dysfunction   Q000111Q: Myocardial Perfusion Study: There was no ST segment deviation noted during stress.  Arrhythmias during stress: occasional PVCs.  Arrhythmias during recovery: none.  Arrhythmias were not significant.   ECG was interpretable and conclusive.  BNP: 17  11/2015: PFT's Mild Obstruction,minimal small airway disease. Non-responsive to BD.  02/04/2016:Walk: No desaturations/ Peak HR: 102  \ A/P :Patient is a former smoker. Quit in 1996. 2 years ago in 2015 had pulmonary embolism. Follow-up CT scan of the chest November 2060 shows this is clear except for small area of scarring. He has had echocardiogram recently that shows grade 1 diastolic dysfunction but no evidence of pulmonary hypertension. Nevertheless he is having dyspnea since his pulmonary embolism. It was  getting worse but then he got admitted for flu/pneumonia and then it started getting better. Now he feels improved. Nevertheless when he does yard work or climb stairs he gets dyspneic. Relieved by rest. Walking desaturation test on an 85 feet 3 laps in the office: Did not desaturate. He is overweight. He is on calcium channel blocker. He does have hypertension. Prior CT without ILD. Dyspnea: Likely due to obesity, diastolic dysfunction, physical deconditioning. Alternate differential diagnosis chronotropic incompetence due to verapamil . Do CPST   OV 03/08/2016  Chief Complaint  Patient presents with  . Follow-up    review stress test.  pt states he is doing well at this time, minimal DOE.       follow-up dyspnea thought not to be due to pulmonary hypertension following PE  He ha pulmonary strst test 02/11/2016. He is here to review the test results. I personally visualized the report in the tracings. In my personal opinionhis effort intensity was adequate / his VO2 max was low 70s but it orrected to normal when adjusted for a ideal  body weight. THis with RR 40 and approaching Ve suggests obesity playing a role in dyspnea. Doubt pulm limitation due to lack of desats and fev1 oh post test drop of only 6% and 03/08/2016 feno of 14 (however he did wheeze post cpst). Also, had hypertensive response sbp 200s and stroke volume though normal at peka flattens. This is c/w diast dysfn    Since last visit dyspnea better - by self - so doubt is asthma     has a past medical  history of Hypertension; GERD (gastroesophageal reflux disease); Arthritis; Chronic kidney disease; H/O hiatal hernia; Wears glasses; and Chest pain (10/02/2015).   reports that he quit smoking about 21 years ago. His smoking use included Cigarettes. He has a 40 pack-year smoking history. He has never used smokeless tobacco.  Past Surgical History  Procedure Laterality Date  . Joint replacement  4/12    lt total knee  . Knee  closed reduction  6/12    lt -after lt total knee  . Cholecystectomy  1976  . Appendectomy  1976  . Knee arthroscopy      rt and lt  . Colonoscopy    . Back surgery  1984    lumbar lam  . Carpal tunnel release  12/06/2011    Procedure: CARPAL TUNNEL RELEASE;  Surgeon: Wynonia Sours, MD;  Location: [place];  Service: Orthopedics;  Laterality: Right;  . Trigger finger release  12/06/2011    Procedure: RELEASE TRIGGER FINGER/A-1 PULLEY;  Surgeon: Wynonia Sours, MD;  Location: [place];  Service: Orthopedics;  Laterality: Right;  right thumb, middle, and ring fingers  . Carpal tunnel release  08/07/2012    Procedure: CARPAL TUNNEL RELEASE;  Surgeon: Wynonia Sours, MD;  Location: [place];  Service: Orthopedics;  Laterality: Left;  carpal tunnel release  . Trigger finger release  08/07/2012    Procedure: RELEASE TRIGGER FINGER/A-1 PULLEY;  Surgeon: Wynonia Sours, MD;  Location: [place];  Service: Orthopedics;  Laterality: Left;  trigger release left ring finger    No Known Allergies  Immunization History  Administered Date(s) Administered  . Influenza,inj,Quad PF,36+ Mos 08/01/2015  . Pneumococcal Conjugate-13 10/03/2014  . Pneumococcal Polysaccharide-23 10/03/2013    Family History  Problem Relation Age of Onset  . Stroke Mother   . Cancer Father   . Hypertension Child   . Hypertension Child      Current outpatient prescriptions:  .  aspirin 81 MG tablet, Take 81 mg by mouth daily., Disp: , Rfl:  .  fluticasone (FLONASE) 50 MCG/ACT nasal spray, Place 2 sprays into the nose as needed., Disp: , Rfl:  .  HYDROcodone-acetaminophen (NORCO) 5-325 MG per tablet, Take 1 tablet by mouth every 6 (six) hours as needed for pain., Disp: 30 tablet, Rfl: 0 .  meloxicam (MOBIC) 15 MG tablet, Take 15 mg by mouth daily., Disp: , Rfl:  .  metoCLOPramide (REGLAN) 5 MG tablet, Take 5 mg by mouth daily. , Disp: , Rfl:  .  omeprazole  (PRILOSEC) 40 MG capsule, Take 40 mg by mouth every evening., Disp: , Rfl:  .  Probiotic Product (PROBIOTIC ADVANCED PO), Take by mouth daily., Disp: , Rfl:  .  therapeutic multivitamin-minerals (THERAGRAN-M) tablet, Take 1 tablet by mouth daily., Disp: , Rfl:  .  valsartan-hydrochlorothiazide (DIOVAN-HCT) 320-12.5 MG per tablet, Take 1 tablet by mouth daily., Disp: , Rfl:  .  verapamil (CALAN-SR) 240 MG CR tablet, Take 1 tablet by mouth daily., Disp: , Rfl:  .  vitamin E (VITAMIN E) 400 UNIT capsule, Take 400 Units by mouth daily., Disp: , Rfl:     Review of Systems     Objective:   Physical Exam  Filed Vitals:   03/08/16 1449  BP: 136/74  Pulse: 81  Height: 5\' 8"  (1.727 m)  Weight: 208 lb 12.8 oz (94.711 kg)  SpO2: 98%   Estimated body mass index is 31.76 kg/(m^2) as calculated from the following:  Height as of this encounter: 5\' 8"  (1.727 m).   Weight as of this encounter: 208 lb 12.8 oz (94.711 kg).  discusion visit      Assessment:       ICD-9-CM ICD-10-CM   1. Dyspnea 786.09 R06.00         Plan:      Shortness of breath is due to weight, and related high bp response and heart muscle not relaxing well (diast dysfunction). Doubt due to asthma though you did wheeze which I cannot fully explain  Plan - daily exercise or 3times a week - use heart rate belt and ensure yuou keep it <135/min - weight loss - better resting bp control via PCP PATERSON,DANIEL G, MD  Followup  - if above measure do not help or you are getting worse we can try asthma inhaler   (> 50% of this 15 min visit spent in face to face counseling or/and coordination of care)  Dr. Brand Males, M.D., [facility].C.P Pulmonary and Critical Care Medicine Staff Physician [place] [facility] Pager: [phone], If no answer or between  15:00h - 7:00h: call [phone]  03/08/2016 3:20 PM

## 2016-03-08 NOTE — Patient Instructions (Addendum)
ICD-9-CM ICD-10-CM   1. Dyspnea 786.09 R06.00    Shortness of breath is due to weight, and related high bp response and heart muscle not relaxing well (diast dysfunction). Doubt due to asthma though you did wheeze which I cannot fully explain  Plan - daily exercise or 3times a week - use heart rate belt and ensure yuou keep it <135/min - weight loss - better resting bp control via PCP PATERSON,DANIEL G, MD  Followup  - if above measure do not help or you are getting worse we can try asthma inhaler

## 2016-03-08 NOTE — Addendum Note (Signed)
Addended by: Len Blalock on: 03/08/2016 03:41 PM   Modules accepted: Orders

## 2016-05-30 DIAGNOSIS — H40013 Open angle with borderline findings, low risk, bilateral: Secondary | ICD-10-CM | POA: Diagnosis not present

## 2016-05-30 DIAGNOSIS — Z961 Presence of intraocular lens: Secondary | ICD-10-CM | POA: Diagnosis not present

## 2016-05-30 DIAGNOSIS — H35033 Hypertensive retinopathy, bilateral: Secondary | ICD-10-CM | POA: Diagnosis not present

## 2016-05-30 DIAGNOSIS — H35371 Puckering of macula, right eye: Secondary | ICD-10-CM | POA: Diagnosis not present

## 2016-07-15 DIAGNOSIS — Z23 Encounter for immunization: Secondary | ICD-10-CM | POA: Diagnosis not present

## 2016-07-20 ENCOUNTER — Ambulatory Visit (INDEPENDENT_AMBULATORY_CARE_PROVIDER_SITE_OTHER): Payer: Medicare Other | Admitting: Ophthalmology

## 2016-07-20 DIAGNOSIS — I10 Essential (primary) hypertension: Secondary | ICD-10-CM

## 2016-07-20 DIAGNOSIS — H35033 Hypertensive retinopathy, bilateral: Secondary | ICD-10-CM | POA: Diagnosis not present

## 2016-07-20 DIAGNOSIS — H35371 Puckering of macula, right eye: Secondary | ICD-10-CM

## 2016-07-20 DIAGNOSIS — H43813 Vitreous degeneration, bilateral: Secondary | ICD-10-CM

## 2016-08-05 ENCOUNTER — Telehealth: Payer: Self-pay | Admitting: *Deleted

## 2016-08-05 NOTE — Telephone Encounter (Signed)
Request for surgical clearance:  1. What type of surgery is being performed? Opthalmic Surgery under general anaesthesia at [facility]  2. When is this surgery scheduled? 08/30/16   3. Are there any medications that need to be held prior to surgery and how long?Aspirin 5 days prior  4. Name of physician performing surgery? Dr Tempie Hoist   5. What is your office phone and fax number? [facility] phone [phone] fax [phone]  Will forward to Dr Oval Linsey for review

## 2016-08-09 NOTE — Telephone Encounter (Signed)
Low risk. Okay to hold aspirin for 5 days.

## 2016-08-09 NOTE — Telephone Encounter (Signed)
Sent to Dr Zigmund Daniel via epic

## 2016-08-16 NOTE — H&P (Signed)
Frank Johnston is an 75 y.o. male.   Chief Complaint:painless loss of vision over 6 months right eye HPI: Noted progressive loss of vision and distortion right eye  Past Medical History:  Diagnosis Date  . Arthritis   . Chest pain 10/02/2015  . Chronic kidney disease    hx kidney stones  . GERD (gastroesophageal reflux disease)   . H/O hiatal hernia   . Hypertension   . Wears glasses     Past Surgical History:  Procedure Laterality Date  . APPENDECTOMY  1976  . BACK SURGERY  1984   lumbar lam  . CARPAL TUNNEL RELEASE  12/06/2011   Procedure: CARPAL TUNNEL RELEASE;  Surgeon: Wynonia Sours, MD;  Location: [place];  Service: Orthopedics;  Laterality: Right;  . CARPAL TUNNEL RELEASE  08/07/2012   Procedure: CARPAL TUNNEL RELEASE;  Surgeon: Wynonia Sours, MD;  Location: [place];  Service: Orthopedics;  Laterality: Left;  carpal tunnel release  . CHOLECYSTECTOMY  1976  . COLONOSCOPY    . JOINT REPLACEMENT  4/12   lt total knee  . KNEE ARTHROSCOPY     rt and lt  . KNEE CLOSED REDUCTION  6/12   lt -after lt total knee  . TRIGGER FINGER RELEASE  12/06/2011   Procedure: RELEASE TRIGGER FINGER/A-1 PULLEY;  Surgeon: Wynonia Sours, MD;  Location: [place];  Service: Orthopedics;  Laterality: Right;  right thumb, middle, and ring fingers  . TRIGGER FINGER RELEASE  08/07/2012   Procedure: RELEASE TRIGGER FINGER/A-1 PULLEY;  Surgeon: Wynonia Sours, MD;  Location: [place];  Service: Orthopedics;  Laterality: Left;  trigger release left ring finger    Family History  Problem Relation Age of Onset  . Stroke Mother   . Cancer Father   . Hypertension Child   . Hypertension Child    Social History:  reports that he quit smoking about 21 years ago. His smoking use included Cigarettes. He has a 40.00 pack-year smoking history. He has never used smokeless tobacco. He reports that he does not drink alcohol or use  drugs.  Allergies: No Known Allergies  No prescriptions prior to admission.    Review of systems otherwise negative  There were no vitals taken for this visit.  Physical exam: Mental status: oriented x3. Eyes: See eye exam associated with this date of surgery in media tab.  Scanned in by scanning center Ears, Nose, Throat: within normal limits Neck: Within Normal limits General: within normal limits Chest: Within normal limits Breast: deferred Heart: Within normal limits Abdomen: Within normal limits GU: deferred Extremities: within normal limits Skin: within normal limits  Assessment/Plan Preretinal fibrosis right eye Plan: To [facility] for Pars plana vitrectomy, laser, membrane peel, gas injection right eye  Frank Johnston 08/16/2016, 5:17 PM

## 2016-08-30 ENCOUNTER — Encounter (INDEPENDENT_AMBULATORY_CARE_PROVIDER_SITE_OTHER): Payer: Medicare Other | Admitting: Ophthalmology

## 2016-09-06 ENCOUNTER — Ambulatory Visit (HOSPITAL_COMMUNITY)
Admission: RE | Admit: 2016-09-06 | Discharge: 2016-09-07 | Disposition: A | Discharge status: Home / Self Care | Payer: Medicare Other | Source: Ambulatory Visit | Attending: Ophthalmology | Admitting: Ophthalmology

## 2016-09-06 ENCOUNTER — Encounter (INDEPENDENT_AMBULATORY_CARE_PROVIDER_SITE_OTHER): Payer: Medicare Other | Admitting: Ophthalmology

## 2016-09-06 ENCOUNTER — Encounter (HOSPITAL_COMMUNITY)
Admission: RE | Disposition: A | Discharge status: Home / Self Care | Payer: Self-pay | Source: Ambulatory Visit | Attending: Ophthalmology

## 2016-09-06 ENCOUNTER — Encounter (HOSPITAL_COMMUNITY): Payer: Self-pay | Admitting: *Deleted

## 2016-09-06 ENCOUNTER — Ambulatory Visit (HOSPITAL_COMMUNITY): Payer: Medicare Other | Admitting: Certified Registered Nurse Anesthetist

## 2016-09-06 DIAGNOSIS — H35033 Hypertensive retinopathy, bilateral: Secondary | ICD-10-CM

## 2016-09-06 DIAGNOSIS — N189 Chronic kidney disease, unspecified: Secondary | ICD-10-CM | POA: Insufficient documentation

## 2016-09-06 DIAGNOSIS — K219 Gastro-esophageal reflux disease without esophagitis: Secondary | ICD-10-CM | POA: Insufficient documentation

## 2016-09-06 DIAGNOSIS — Z87891 Personal history of nicotine dependence: Secondary | ICD-10-CM | POA: Diagnosis not present

## 2016-09-06 DIAGNOSIS — H35371 Puckering of macula, right eye: Secondary | ICD-10-CM

## 2016-09-06 DIAGNOSIS — Z7982 Long term (current) use of aspirin: Secondary | ICD-10-CM | POA: Insufficient documentation

## 2016-09-06 DIAGNOSIS — Z8249 Family history of ischemic heart disease and other diseases of the circulatory system: Secondary | ICD-10-CM | POA: Insufficient documentation

## 2016-09-06 DIAGNOSIS — M1990 Unspecified osteoarthritis, unspecified site: Secondary | ICD-10-CM | POA: Insufficient documentation

## 2016-09-06 DIAGNOSIS — J449 Chronic obstructive pulmonary disease, unspecified: Secondary | ICD-10-CM | POA: Diagnosis not present

## 2016-09-06 DIAGNOSIS — Z809 Family history of malignant neoplasm, unspecified: Secondary | ICD-10-CM | POA: Insufficient documentation

## 2016-09-06 DIAGNOSIS — I82409 Acute embolism and thrombosis of unspecified deep veins of unspecified lower extremity: Secondary | ICD-10-CM | POA: Diagnosis not present

## 2016-09-06 DIAGNOSIS — H3589 Other specified retinal disorders: Secondary | ICD-10-CM | POA: Insufficient documentation

## 2016-09-06 DIAGNOSIS — Z823 Family history of stroke: Secondary | ICD-10-CM | POA: Insufficient documentation

## 2016-09-06 DIAGNOSIS — H1131 Conjunctival hemorrhage, right eye: Secondary | ICD-10-CM | POA: Diagnosis not present

## 2016-09-06 DIAGNOSIS — H43813 Vitreous degeneration, bilateral: Secondary | ICD-10-CM | POA: Diagnosis not present

## 2016-09-06 DIAGNOSIS — I10 Essential (primary) hypertension: Secondary | ICD-10-CM | POA: Diagnosis not present

## 2016-09-06 DIAGNOSIS — H5060 Mechanical strabismus, unspecified: Secondary | ICD-10-CM | POA: Diagnosis not present

## 2016-09-06 DIAGNOSIS — Z6831 Body mass index (BMI) 31.0-31.9, adult: Secondary | ICD-10-CM | POA: Diagnosis not present

## 2016-09-06 DIAGNOSIS — Z86711 Personal history of pulmonary embolism: Secondary | ICD-10-CM | POA: Insufficient documentation

## 2016-09-06 DIAGNOSIS — I129 Hypertensive chronic kidney disease with stage 1 through stage 4 chronic kidney disease, or unspecified chronic kidney disease: Secondary | ICD-10-CM | POA: Insufficient documentation

## 2016-09-06 HISTORY — PX: 25 GAUGE PARS PLANA VITRECTOMY WITH 20 GAUGE MVR PORT: SHX6041

## 2016-09-06 LAB — BASIC METABOLIC PANEL
Anion gap: 14 (ref 5–15)
BUN: 25 mg/dL — ABNORMAL HIGH (ref 6–20)
CALCIUM: 10.2 mg/dL (ref 8.9–10.3)
CHLORIDE: 105 mmol/L (ref 101–111)
CO2: 21 mmol/L — AB (ref 22–32)
CREATININE: 1.45 mg/dL — AB (ref 0.61–1.24)
GFR calc Af Amer: 52 mL/min — ABNORMAL LOW (ref >60)
GFR calc non Af Amer: 45 mL/min — ABNORMAL LOW (ref >60)
Glucose, Bld: 94 mg/dL (ref 65–99)
Potassium: 4.3 mmol/L (ref 3.5–5.1)
SODIUM: 140 mmol/L (ref 135–145)

## 2016-09-06 LAB — CBC
HCT: 47.2 % (ref 39.0–52.0)
HEMOGLOBIN: 16.5 g/dL (ref 13.0–17.0)
MCH: 29.5 pg (ref 26.0–34.0)
MCHC: 35 g/dL (ref 30.0–36.0)
MCV: 84.3 fL (ref 78.0–100.0)
Platelets: 256 10*3/uL (ref 150–400)
RBC: 5.6 MIL/uL (ref 4.22–5.81)
RDW: 13.6 % (ref 11.5–15.5)
WBC: 10.3 10*3/uL (ref 4.0–10.5)

## 2016-09-06 SURGERY — 25 GAUGE PARS PLANA VITRECTOMY WITH 20 GAUGE MVR PORT
Anesthesia: General | Site: Eye | Laterality: Right

## 2016-09-06 MED ORDER — LIDOCAINE HCL (CARDIAC) 20 MG/ML IV SOLN
INTRAVENOUS | Status: DC | PRN
  Administered 2016-09-06: 30 mg via INTRAVENOUS

## 2016-09-06 MED ORDER — EPINEPHRINE PF 1 MG/ML IJ SOLN
INTRAMUSCULAR | Status: AC
Start: 2016-09-06 — End: 2016-09-06
  Filled 2016-09-06: qty 1

## 2016-09-06 MED ORDER — PANTOPRAZOLE SODIUM 40 MG PO TBEC
40.0000 mg | DELAYED_RELEASE_TABLET | Freq: Every day | ORAL | Status: DC
  Administered 2016-09-06: 40 mg via ORAL
  Filled 2016-09-06: qty 1

## 2016-09-06 MED ORDER — MEPERIDINE HCL 25 MG/ML IJ SOLN: 6.2500 mg | INTRAMUSCULAR | Status: DC | PRN

## 2016-09-06 MED ORDER — METOCLOPRAMIDE HCL 5 MG PO TABS
5.0000 mg | ORAL_TABLET | Freq: Every day | ORAL | Status: DC
  Administered 2016-09-06: 5 mg via ORAL
  Filled 2016-09-06: qty 1

## 2016-09-06 MED ORDER — FENTANYL CITRATE (PF) 100 MCG/2ML IJ SOLN
INTRAMUSCULAR | Status: AC
Start: 2016-09-06 — End: 2016-09-07
  Filled 2016-09-06: qty 2

## 2016-09-06 MED ORDER — RISAQUAD PO CAPS
1.0000 | ORAL_CAPSULE | Freq: Every day | ORAL | Status: DC
  Administered 2016-09-06: 1 via ORAL
  Filled 2016-09-06: qty 1

## 2016-09-06 MED ORDER — TETRACAINE HCL 0.5 % OP SOLN
2.0000 [drp] | Freq: Once | OPHTHALMIC | Status: DC
  Filled 2016-09-06: qty 2

## 2016-09-06 MED ORDER — SUGAMMADEX SODIUM 200 MG/2ML IV SOLN
INTRAVENOUS | Status: AC
  Filled 2016-09-06: qty 2

## 2016-09-06 MED ORDER — LATANOPROST 0.005 % OP SOLN
1.0000 [drp] | Freq: Every day | OPHTHALMIC | Status: DC
  Filled 2016-09-06: qty 2.5

## 2016-09-06 MED ORDER — VALSARTAN-HYDROCHLOROTHIAZIDE 320-12.5 MG PO TABS: 1.0000 | ORAL_TABLET | Freq: Every day | ORAL | Status: DC

## 2016-09-06 MED ORDER — TRIAMCINOLONE ACETONIDE 40 MG/ML IJ SUSP
INTRAMUSCULAR | Status: AC
  Filled 2016-09-06: qty 5

## 2016-09-06 MED ORDER — GATIFLOXACIN 0.5 % OP SOLN
1.0000 [drp] | OPHTHALMIC | Status: AC
  Administered 2016-09-06 (×3): 1 [drp] via OPHTHALMIC
  Filled 2016-09-06: qty 2.5

## 2016-09-06 MED ORDER — BRIMONIDINE TARTRATE 0.2 % OP SOLN
1.0000 [drp] | Freq: Two times a day (BID) | OPHTHALMIC | Status: DC
  Filled 2016-09-06: qty 5

## 2016-09-06 MED ORDER — SODIUM HYALURONATE 10 MG/ML IO SOLN
INTRAOCULAR | Status: AC
  Filled 2016-09-06: qty 0.85

## 2016-09-06 MED ORDER — MORPHINE SULFATE (PF) 2 MG/ML IV SOLN: 1.0000 mg | INTRAVENOUS | Status: DC | PRN

## 2016-09-06 MED ORDER — HYALURONIDASE HUMAN 150 UNIT/ML IJ SOLN
INTRAMUSCULAR | Status: AC
  Filled 2016-09-06: qty 1

## 2016-09-06 MED ORDER — THERAPEUTIC MULTIVIT/MINERAL PO TABS: 1.0000 | ORAL_TABLET | Freq: Every day | ORAL | Status: DC

## 2016-09-06 MED ORDER — ACETAMINOPHEN 500 MG PO TABS: 1000.0000 mg | ORAL_TABLET | Freq: Every day | ORAL | Status: DC | PRN

## 2016-09-06 MED ORDER — ATROPINE SULFATE 1 % OP SOLN
OPHTHALMIC | Status: AC
  Filled 2016-09-06: qty 5

## 2016-09-06 MED ORDER — BSS PLUS IO SOLN
INTRAOCULAR | Status: DC | PRN
  Administered 2016-09-06: 1 via INTRAOCULAR

## 2016-09-06 MED ORDER — CEFAZOLIN SODIUM-DEXTROSE 2-4 GM/100ML-% IV SOLN
2.0000 g | INTRAVENOUS | Status: AC
  Administered 2016-09-06: 2 g via INTRAVENOUS
  Filled 2016-09-06: qty 100

## 2016-09-06 MED ORDER — STERILE WATER FOR INJECTION IJ SOLN
INTRAMUSCULAR | Status: DC | PRN
  Administered 2016-09-06: 20 mL

## 2016-09-06 MED ORDER — BSS PLUS IO SOLN
INTRAOCULAR | Status: AC
  Filled 2016-09-06: qty 500

## 2016-09-06 MED ORDER — PROPOFOL 10 MG/ML IV BOLUS
INTRAVENOUS | Status: DC | PRN
  Administered 2016-09-06: 200 mg via INTRAVENOUS

## 2016-09-06 MED ORDER — MAGNESIUM HYDROXIDE 400 MG/5ML PO SUSP: 15.0000 mL | Freq: Four times a day (QID) | ORAL | Status: DC | PRN

## 2016-09-06 MED ORDER — ACETAMINOPHEN 325 MG PO TABS: 325.0000 mg | ORAL_TABLET | ORAL | Status: DC | PRN

## 2016-09-06 MED ORDER — VITAMIN E 180 MG (400 UNIT) PO CAPS
400.0000 [IU] | ORAL_CAPSULE | Freq: Every day | ORAL | Status: DC
  Administered 2016-09-06: 400 [IU] via ORAL
  Filled 2016-09-06 (×2): qty 1

## 2016-09-06 MED ORDER — IRBESARTAN 300 MG PO TABS: 300.0000 mg | ORAL_TABLET | Freq: Every day | ORAL | Status: DC

## 2016-09-06 MED ORDER — MIDAZOLAM HCL 2 MG/2ML IJ SOLN: 0.5000 mg | Freq: Once | INTRAMUSCULAR | Status: DC | PRN

## 2016-09-06 MED ORDER — ONDANSETRON HCL 4 MG/2ML IJ SOLN: 4.0000 mg | Freq: Four times a day (QID) | INTRAMUSCULAR | Status: DC | PRN

## 2016-09-06 MED ORDER — TROPICAMIDE 1 % OP SOLN
1.0000 [drp] | OPHTHALMIC | Status: AC
  Administered 2016-09-06 (×3): 1 [drp] via OPHTHALMIC
  Filled 2016-09-06: qty 15

## 2016-09-06 MED ORDER — SODIUM HYALURONATE 10 MG/ML IO SOLN
INTRAOCULAR | Status: DC | PRN
  Administered 2016-09-06: 0.85 mL via INTRAOCULAR

## 2016-09-06 MED ORDER — HYDROCODONE-ACETAMINOPHEN 5-325 MG PO TABS
1.0000 | ORAL_TABLET | Freq: Four times a day (QID) | ORAL | Status: DC | PRN
  Administered 2016-09-06: 1 via ORAL
  Filled 2016-09-06: qty 1

## 2016-09-06 MED ORDER — HYDROCODONE-ACETAMINOPHEN 5-325 MG PO TABS: 1.0000 | ORAL_TABLET | ORAL | Status: DC | PRN

## 2016-09-06 MED ORDER — HYDROCHLOROTHIAZIDE 12.5 MG PO CAPS: 12.5000 mg | ORAL_CAPSULE | Freq: Every day | ORAL | Status: DC

## 2016-09-06 MED ORDER — CEFTAZIDIME 1 G IJ SOLR
INTRAMUSCULAR | Status: AC
  Filled 2016-09-06: qty 1

## 2016-09-06 MED ORDER — DEXAMETHASONE SODIUM PHOSPHATE 10 MG/ML IJ SOLN
INTRAMUSCULAR | Status: AC
  Filled 2016-09-06: qty 1

## 2016-09-06 MED ORDER — TEMAZEPAM 15 MG PO CAPS: 15.0000 mg | ORAL_CAPSULE | Freq: Every evening | ORAL | Status: DC | PRN

## 2016-09-06 MED ORDER — FENTANYL CITRATE (PF) 100 MCG/2ML IJ SOLN
INTRAMUSCULAR | Status: AC
  Filled 2016-09-06: qty 2

## 2016-09-06 MED ORDER — ACETAZOLAMIDE SODIUM 500 MG IJ SOLR
INTRAMUSCULAR | Status: AC
  Filled 2016-09-06: qty 500

## 2016-09-06 MED ORDER — EPINEPHRINE PF 1 MG/ML IJ SOLN
INTRAMUSCULAR | Status: DC | PRN
  Administered 2016-09-06: .3 mL

## 2016-09-06 MED ORDER — CYCLOPENTOLATE HCL 1 % OP SOLN
1.0000 [drp] | OPHTHALMIC | Status: AC
  Administered 2016-09-06 (×3): 1 [drp] via OPHTHALMIC
  Filled 2016-09-06: qty 2

## 2016-09-06 MED ORDER — HEMOSTATIC AGENTS (NO CHARGE) OPTIME
TOPICAL | Status: DC | PRN
  Administered 2016-09-06: 1 via TOPICAL

## 2016-09-06 MED ORDER — BUPIVACAINE HCL (PF) 0.75 % IJ SOLN
INTRAMUSCULAR | Status: DC | PRN
  Administered 2016-09-06: 30 mL

## 2016-09-06 MED ORDER — PHENYLEPHRINE HCL 10 MG/ML IJ SOLN
INTRAMUSCULAR | Status: DC | PRN
  Administered 2016-09-06: 30 ug/min via INTRAVENOUS

## 2016-09-06 MED ORDER — BACITRACIN-POLYMYXIN B 500-10000 UNIT/GM OP OINT
TOPICAL_OINTMENT | OPHTHALMIC | Status: DC | PRN
  Administered 2016-09-06: 1 via OPHTHALMIC

## 2016-09-06 MED ORDER — DEXAMETHASONE SODIUM PHOSPHATE 10 MG/ML IJ SOLN
INTRAMUSCULAR | Status: DC | PRN
  Administered 2016-09-06: 10 mg via INTRAVENOUS

## 2016-09-06 MED ORDER — BROMFENAC SODIUM 0.075 % OP SOLN: 1.0000 [drp] | Freq: Two times a day (BID) | OPHTHALMIC | Status: DC

## 2016-09-06 MED ORDER — FENTANYL CITRATE (PF) 100 MCG/2ML IJ SOLN
25.0000 ug | INTRAMUSCULAR | Status: DC | PRN
  Administered 2016-09-06: 50 ug via INTRAVENOUS

## 2016-09-06 MED ORDER — LIDOCAINE HCL 2 % IJ SOLN
INTRAMUSCULAR | Status: AC
  Filled 2016-09-06: qty 20

## 2016-09-06 MED ORDER — STERILE WATER FOR INJECTION IJ SOLN
INTRAMUSCULAR | Status: AC
  Filled 2016-09-06: qty 20

## 2016-09-06 MED ORDER — BACITRACIN-POLYMYXIN B 500-10000 UNIT/GM OP OINT
1.0000 "application " | TOPICAL_OINTMENT | Freq: Three times a day (TID) | OPHTHALMIC | Status: DC
  Filled 2016-09-06: qty 3.5

## 2016-09-06 MED ORDER — PROBIOTIC ADVANCED PO CAPS
ORAL_CAPSULE | Freq: Every day | ORAL | Status: DC
Start: 2016-09-06 — End: 2016-09-06

## 2016-09-06 MED ORDER — FENTANYL CITRATE (PF) 100 MCG/2ML IJ SOLN
INTRAMUSCULAR | Status: DC | PRN
  Administered 2016-09-06: 100 ug via INTRAVENOUS

## 2016-09-06 MED ORDER — PROMETHAZINE HCL 25 MG/ML IJ SOLN: 6.2500 mg | INTRAMUSCULAR | Status: DC | PRN

## 2016-09-06 MED ORDER — ROCURONIUM BROMIDE 100 MG/10ML IV SOLN
INTRAVENOUS | Status: DC | PRN
  Administered 2016-09-06: 50 mg via INTRAVENOUS

## 2016-09-06 MED ORDER — BUPIVACAINE-EPINEPHRINE (PF) 0.25% -1:200000 IJ SOLN
INTRAMUSCULAR | Status: AC
  Filled 2016-09-06: qty 30

## 2016-09-06 MED ORDER — PHENYLEPHRINE HCL 2.5 % OP SOLN
1.0000 [drp] | OPHTHALMIC | Status: AC
  Administered 2016-09-06 (×3): 1 [drp] via OPHTHALMIC
  Filled 2016-09-06: qty 2

## 2016-09-06 MED ORDER — ADULT MULTIVITAMIN W/MINERALS CH: 1.0000 | ORAL_TABLET | Freq: Every day | ORAL | Status: DC

## 2016-09-06 MED ORDER — BSS IO SOLN
INTRAOCULAR | Status: DC | PRN
  Administered 2016-09-06: 15 mL via INTRAOCULAR

## 2016-09-06 MED ORDER — SODIUM CHLORIDE 0.45 % IV SOLN
INTRAVENOUS | Status: DC
  Administered 2016-09-06: 18:00:00 via INTRAVENOUS

## 2016-09-06 MED ORDER — FLUTICASONE PROPIONATE 50 MCG/ACT NA SUSP
2.0000 | Freq: Every evening | NASAL | Status: DC | PRN
  Filled 2016-09-06: qty 16

## 2016-09-06 MED ORDER — HYPROMELLOSE (GONIOSCOPIC) 2.5 % OP SOLN
OPHTHALMIC | Status: AC
  Filled 2016-09-06: qty 15

## 2016-09-06 MED ORDER — SUGAMMADEX SODIUM 200 MG/2ML IV SOLN
INTRAVENOUS | Status: DC | PRN
  Administered 2016-09-06: 200 mg via INTRAVENOUS

## 2016-09-06 MED ORDER — VERAPAMIL HCL ER 240 MG PO TBCR
240.0000 mg | EXTENDED_RELEASE_TABLET | Freq: Every day | ORAL | Status: DC
  Administered 2016-09-06: 240 mg via ORAL
  Filled 2016-09-06: qty 1

## 2016-09-06 MED ORDER — SODIUM CHLORIDE 0.9 % IJ SOLN
INTRAMUSCULAR | Status: AC
  Filled 2016-09-06: qty 10

## 2016-09-06 MED ORDER — SODIUM CHLORIDE 0.9 % IV SOLN
INTRAVENOUS | Status: DC
  Administered 2016-09-06 (×2): via INTRAVENOUS

## 2016-09-06 MED ORDER — DORZOLAMIDE HCL 2 % OP SOLN
1.0000 [drp] | Freq: Three times a day (TID) | OPHTHALMIC | Status: DC
  Filled 2016-09-06: qty 10

## 2016-09-06 MED ORDER — BACITRACIN-POLYMYXIN B 500-10000 UNIT/GM OP OINT
TOPICAL_OINTMENT | OPHTHALMIC | Status: AC
  Filled 2016-09-06: qty 3.5

## 2016-09-06 MED ORDER — PHENYLEPHRINE HCL 10 MG/ML IJ SOLN
INTRAMUSCULAR | Status: DC | PRN
  Administered 2016-09-06: 120 ug via INTRAVENOUS
  Administered 2016-09-06: 40 ug via INTRAVENOUS
  Administered 2016-09-06 (×2): 120 ug via INTRAVENOUS

## 2016-09-06 MED ORDER — GATIFLOXACIN 0.5 % OP SOLN
1.0000 [drp] | Freq: Four times a day (QID) | OPHTHALMIC | Status: DC
  Filled 2016-09-06: qty 2.5

## 2016-09-06 MED ORDER — PREDNISOLONE ACETATE 1 % OP SUSP
1.0000 [drp] | Freq: Four times a day (QID) | OPHTHALMIC | Status: DC
  Filled 2016-09-06: qty 5

## 2016-09-06 MED ORDER — BSS IO SOLN
INTRAOCULAR | Status: AC
  Filled 2016-09-06: qty 15

## 2016-09-06 MED ORDER — POLYMYXIN B SULFATE 500000 UNITS IJ SOLR
INTRAMUSCULAR | Status: AC
  Filled 2016-09-06: qty 500000

## 2016-09-06 MED ORDER — BUPIVACAINE HCL (PF) 0.75 % IJ SOLN
INTRAMUSCULAR | Status: AC
  Filled 2016-09-06: qty 10

## 2016-09-06 MED ORDER — ACETAZOLAMIDE SODIUM 500 MG IJ SOLR
500.0000 mg | Freq: Once | INTRAMUSCULAR | Status: AC
  Administered 2016-09-07: 500 mg via INTRAVENOUS
  Filled 2016-09-06: qty 500

## 2016-09-06 SURGICAL SUPPLY — 66 items
APL SRG 3 HI ABS STRL LF PLS (MISCELLANEOUS)
APPLICATOR DR MATTHEWS STRL (MISCELLANEOUS) IMPLANT
BALL CTTN LRG ABS STRL LF (GAUZE/BANDAGES/DRESSINGS) ×3
BLADE EYE CATARACT 19 1.4 BEAV (BLADE) IMPLANT
BLADE MVR KNIFE 19G (BLADE) IMPLANT
BLADE MVR KNIFE 20G (BLADE) IMPLANT
CANNULA DUAL BORE 23G (CANNULA) IMPLANT
CANNULA VLV SOFT TIP 25GA (OPHTHALMIC) ×3 IMPLANT
CORDS BIPOLAR (ELECTRODE) ×3 IMPLANT
COTTONBALL LRG STERILE PKG (GAUZE/BANDAGES/DRESSINGS) ×9 IMPLANT
COVER MAYO STAND STRL (DRAPES) ×3 IMPLANT
DRAPE INCISE 51X51 W/FILM STRL (DRAPES) IMPLANT
DRAPE OPHTHALMIC 77X100 STRL (CUSTOM PROCEDURE TRAY) ×3 IMPLANT
EAGLE VIT/RET MICRO PIC 168 25 (MISCELLANEOUS) ×3 IMPLANT
FILTER BLUE MILLIPORE (MISCELLANEOUS) IMPLANT
FILTER STRAW FLUID ASPIR (MISCELLANEOUS) IMPLANT
FORCEPS ECKARDT ILM 25G SERR (OPHTHALMIC RELATED) IMPLANT
FORCEPS GRIESHABER ILM 25G A (INSTRUMENTS) ×3 IMPLANT
FORCEPS ILM 25G DSP TIP (MISCELLANEOUS) IMPLANT
GLOVE SS BIOGEL STRL SZ 6.5 (GLOVE) ×1 IMPLANT
GLOVE SS BIOGEL STRL SZ 7 (GLOVE) ×1 IMPLANT
GLOVE SUPERSENSE BIOGEL SZ 6.5 (GLOVE) ×2
GLOVE SUPERSENSE BIOGEL SZ 7 (GLOVE) ×2
GLOVE SURG 8.5 LATEX PF (GLOVE) ×3 IMPLANT
GOWN STRL REUS W/ TWL LRG LVL3 (GOWN DISPOSABLE) ×3 IMPLANT
GOWN STRL REUS W/TWL LRG LVL3 (GOWN DISPOSABLE) ×9
HANDLE PNEUMATIC FOR CONSTEL (OPHTHALMIC) ×3 IMPLANT
KIT BASIN OR (CUSTOM PROCEDURE TRAY) ×3 IMPLANT
KIT ROOM TURNOVER OR (KITS) ×3 IMPLANT
MICROPICK 25G (MISCELLANEOUS)
NEEDLE 18GX1X1/2 (RX/OR ONLY) (NEEDLE) ×3 IMPLANT
NEEDLE 25GX 5/8IN NON SAFETY (NEEDLE) ×3 IMPLANT
NEEDLE FILTER BLUNT 18X 1/2SAF (NEEDLE) ×2
NEEDLE FILTER BLUNT 18X1 1/2 (NEEDLE) ×1 IMPLANT
NEEDLE HYPO 30X.5 LL (NEEDLE) ×6 IMPLANT
NEEDLE PRECISIONGLIDE 27X1.5 (NEEDLE) IMPLANT
NS IRRIG 1000ML POUR BTL (IV SOLUTION) ×3 IMPLANT
PACK VITRECTOMY CUSTOM (CUSTOM PROCEDURE TRAY) ×3 IMPLANT
PAD ARMBOARD 7.5X6 YLW CONV (MISCELLANEOUS) ×6 IMPLANT
PAK PIK VITRECTOMY CVS 25GA (OPHTHALMIC) ×3 IMPLANT
PENCIL BIPOLAR 25GA STR DISP (OPHTHALMIC RELATED) IMPLANT
PIC ILLUMINATED 25G (OPHTHALMIC) ×3
PICK MICROPICK 25G (MISCELLANEOUS) IMPLANT
PIK ILLUMINATED 25G (OPHTHALMIC) ×1 IMPLANT
PROBE LASER ILLUM FLEX CVD 25G (OPHTHALMIC) ×3 IMPLANT
REPL STRA BRUSH NEEDLE (NEEDLE) IMPLANT
RESERVOIR BACK FLUSH (MISCELLANEOUS) IMPLANT
ROLLS DENTAL (MISCELLANEOUS) ×6 IMPLANT
SCRAPER DIAMOND 25GA (OPHTHALMIC RELATED) IMPLANT
SCRAPER DIAMOND DUST MEMBRANE (MISCELLANEOUS) ×6 IMPLANT
SPONGE SURGIFOAM ABS GEL 12-7 (HEMOSTASIS) ×3 IMPLANT
STOPCOCK 4 WAY LG BORE MALE ST (IV SETS) IMPLANT
SUT CHROMIC 7 0 TG140 8 (SUTURE) ×3 IMPLANT
SUT ETHILON 10 0 CS140 6 (SUTURE) IMPLANT
SUT ETHILON 9 0 TG140 8 (SUTURE) ×3 IMPLANT
SUT POLY NON ABSORB 10-0 8 STR (SUTURE) IMPLANT
SUT SILK 4 0 RB 1 (SUTURE) IMPLANT
SYR 20CC LL (SYRINGE) ×3 IMPLANT
SYR BULB 3OZ (MISCELLANEOUS) ×3 IMPLANT
SYR TB 1ML LUER SLIP (SYRINGE) ×3 IMPLANT
SYRINGE 10CC LL (SYRINGE) ×12 IMPLANT
TOWEL OR 17X24 6PK STRL BLUE (TOWEL DISPOSABLE) ×3 IMPLANT
TOWEL OR 17X26 10 PK STRL BLUE (TOWEL DISPOSABLE) ×3 IMPLANT
TUBING HIGH PRESS EXTEN 6IN (TUBING) IMPLANT
WATER STERILE IRR 1000ML POUR (IV SOLUTION) ×3 IMPLANT
WIPE INSTRUMENT VISIWIPE 73X73 (MISCELLANEOUS) ×3 IMPLANT

## 2016-09-06 NOTE — Anesthesia Preprocedure Evaluation (Addendum)
Anesthesia Evaluation  Patient identified by MRN, date of birth, ID band Patient awake    Reviewed: Allergy & Precautions, NPO status , Patient's Chart, lab work & pertinent test results  History of Anesthesia Complications Negative for: history of anesthetic complications  Airway Mallampati: II  TM Distance: >3 FB Neck ROM: Full    Dental  (+) Dental Advisory Given   Pulmonary COPD, former smoker, PE (off of anticoag for a year)   breath sounds clear to auscultation       Cardiovascular hypertension,  Rhythm:Regular Rate:Normal  2/17 ECHO: EF 55-60%, valves OK   Neuro/Psych negative neurological ROS     GI/Hepatic Neg liver ROS, GERD  Controlled,  Endo/Other  Morbid obesity  Renal/GU Renal InsufficiencyRenal disease (creat 1.45)     Musculoskeletal  (+) Arthritis , Osteoarthritis,    Abdominal (+) + obese,   Peds  Hematology negative hematology ROS (+)   Anesthesia Other Findings   Reproductive/Obstetrics                            Anesthesia Physical Anesthesia Plan  ASA: III  Anesthesia Plan: General   Post-op Pain Management:    Induction: Intravenous  Airway Management Planned: Oral ETT  Additional Equipment:   Intra-op Plan:   Post-operative Plan: Extubation in OR  Informed Consent: I have reviewed the patients History and Physical, chart, labs and discussed the procedure including the risks, benefits and alternatives for the proposed anesthesia with the patient or authorized representative who has indicated his/her understanding and acceptance.   Dental advisory given  Plan Discussed with: CRNA and Surgeon  Anesthesia Plan Comments: (Plan routine monitors, GETA)        Anesthesia Quick Evaluation

## 2016-09-06 NOTE — Transfer of Care (Signed)
Immediate Anesthesia Transfer of Care Note  Patient: Frank Johnston  Procedure(s) Performed: Procedure(s): 25 GAUGE PARS PLANA VITRECTOMY WITH 20 GAUGE MVR PORT (Right)  Patient Location: PACU  Anesthesia Type:General  Level of Consciousness: awake, alert , oriented and patient cooperative  Airway & Oxygen Therapy: Patient Spontanous Breathing  Post-op Assessment: Report given to RN and Post -op Vital signs reviewed and stable  Post vital signs: Reviewed and stable  Last Vitals:  Vitals:   09/06/16 0931 09/06/16 1446  BP: (!) 168/92   Pulse:    Resp:    Temp:  (P) 36.6 C    Last Pain:  Vitals:   09/06/16 0929  TempSrc: Oral  PainSc:       Patients Stated Pain Goal: 3 ([id number])  Complications: No apparent anesthesia complications

## 2016-09-06 NOTE — Anesthesia Procedure Notes (Addendum)
Procedure Name: Intubation Date/Time: 09/06/2016 1:37 PM Performed by: Salli Quarry Addam Goeller Pre-anesthesia Checklist: Patient identified, Emergency Drugs available, Suction available and Patient being monitored Patient Re-evaluated:Patient Re-evaluated prior to inductionOxygen Delivery Method: Circle System Utilized Preoxygenation: Pre-oxygenation with 100% oxygen Intubation Type: IV induction Ventilation: Mask ventilation without difficulty Laryngoscope Size: Mac and 4 Grade View: Grade II Tube type: Oral Tube size: 7.5 mm Number of attempts: 1 Airway Equipment and Method: Stylet and Oral airway Placement Confirmation: ETT inserted through vocal cords under direct vision,  positive ETCO2 and breath sounds checked- equal and bilateral Secured at: 23 cm Tube secured with: Tape Dental Injury: Teeth and Oropharynx as per pre-operative assessment

## 2016-09-06 NOTE — Brief Op Note (Signed)
Brief Operative note   Preoperative diagnosis:  pre retinal fibrosis right eye Postoperative diagnosis  * No Diagnosis Codes entered *  Procedures: Pars plana vitrectomy, membrane peel, laser, gas injection right eye  Surgeon:  [name], MD...  Assistant:  Deatra Ina SA    Anesthesia: General  Specimen: none  Estimated blood loss:  1cc  Complications: none  Patient sent to PACU in good condition  Composed by [name] MD  Dictation number: [phone]

## 2016-09-06 NOTE — Anesthesia Postprocedure Evaluation (Signed)
Anesthesia Post Note  Patient: Frank Johnston  Procedure(s) Performed: Procedure(s) (LRB): 25 GAUGE PARS PLANA VITRECTOMY WITH 20 GAUGE MVR PORT (Right)  Patient location during evaluation: PACU Anesthesia Type: General Level of consciousness: awake and alert, oriented and patient cooperative Pain management: pain level controlled Vital Signs Assessment: post-procedure vital signs reviewed and stable Respiratory status: spontaneous breathing, nonlabored ventilation and respiratory function stable Cardiovascular status: blood pressure returned to baseline and stable Postop Assessment: no signs of nausea or vomiting Anesthetic complications: no    Last Vitals:  Vitals:   09/06/16 1637 09/06/16 1648  BP:  (!) 147/79  Pulse: 79 86  Resp: 10 15  Temp:  36.9 C    Last Pain:  Vitals:   09/06/16 1648  TempSrc: Oral  PainSc:                  [place] Skalicky,E. Starkeisha Vanwinkle

## 2016-09-06 NOTE — H&P (Signed)
I examined the patient today and there is no change in the medical status 

## 2016-09-06 NOTE — Progress Notes (Signed)
1700 Received pt from PACU, A&O x4. Right eye dressing dry and intact with eye shield in place. Pt denies pain at this time.

## 2016-09-07 ENCOUNTER — Encounter (HOSPITAL_COMMUNITY): Payer: Self-pay | Admitting: Ophthalmology

## 2016-09-07 DIAGNOSIS — H3589 Other specified retinal disorders: Secondary | ICD-10-CM | POA: Diagnosis not present

## 2016-09-07 MED ORDER — GATIFLOXACIN 0.5 % OP SOLN: 1.0000 [drp] | Freq: Four times a day (QID) | OPHTHALMIC | Status: DC

## 2016-09-07 MED ORDER — PREDNISOLONE ACETATE 1 % OP SUSP: 1.0000 [drp] | Freq: Four times a day (QID) | OPHTHALMIC | 0 refills | Status: DC

## 2016-09-07 MED ORDER — BACITRACIN-POLYMYXIN B 500-10000 UNIT/GM OP OINT: 1.0000 "application " | TOPICAL_OINTMENT | Freq: Three times a day (TID) | OPHTHALMIC | 0 refills | Status: DC

## 2016-09-07 NOTE — Progress Notes (Signed)
Patient's RN Arbie Cookey stated okay for patient to discharge home. Discharge instructions reviewed with patient and his wife. All questions answered. Teachback used. Patient has all eye drop prescriptions and belongings. IV was removed by other prior to discharge and dressing clean dry and intact. Patient escorted via wheelchair to private auto.

## 2016-09-07 NOTE — Progress Notes (Signed)
09/07/2016, 6:11 AM  Mental Status:  Awake, Alert, Oriented  Anterior segment: Sub conjunctival hemorrhage     Cornea  Clear    Anterior Chamber Clear    Lens:   Clear, IOL,   Intra Ocular Pressure 16 mmHg with Tonopen  Vitreous: Clear 25%gas bubble   Retina:  Attached Good laser reaction   Impression: Excellent result Retina attached   Final Diagnosis: Principal Problem:   Preretinal fibrosis, right eye   Plan: start post operative eye drops.  Discharge to home.  Give post operative instructions  [name] 09/07/2016, 6:11 AM

## 2016-09-08 NOTE — Op Note (Signed)
NAME:  Frank Johnston, Frank Johnston                 ACCOUNT NO.:  MEDICAL RECORD NO.:  [id number]  LOCATION:                                 FACILITY:  PHYSICIAN:  Silveria Botz D. Zigmund Daniel, M.D.      DATE OF BIRTH:  DATE OF PROCEDURE:  09/06/2016 DATE OF DISCHARGE:                              OPERATIVE REPORT   ADMISSION DIAGNOSIS:  Preretinal fibrosis, right eye.  PROCEDURES:  Pars plana vitrectomy, right eye.  Gas fluid exchange, right eye.  Retinal photocoagulation, right eye.  Membrane peel, right eye.  SURGEON:  Chrystie Nose. Zigmund Daniel, M.D.  ASSISTANT:  Deatra Ina, SA.  ANESTHESIA:  General.  DETAILS:  Usual prep and drape.  The indirect ophthalmoscope laser was moved into place.  981 burns were placed around the retinal periphery in weak areas of retina.  The power was between 400 and 500 mW 1000 microns each and 0.1 seconds each.  The pars plana vitrectomy; the attention was then carried to the pars plana, where 25-gauge trocars were placed at 8 and 10 o'clock.  The conjunctiva was incised at 2 o'clock and MVR incision was made with a 3-layered scleral incision at 2 o'clock. Contact lens ring was anchored into place at 6 and 12 o'clock.  Provisc was placed on the corneal surface and the flat contact lens was placed. Pars plana vitrectomy was begun just behind the pseudophakos, dense white membranes were encountered.  These were carefully removed under low suction and rapid cutting.  The vitrectomy was carried down to the macular surface in a core fashion.  Once the entire core of the vitreous was removed, the attention was carried to the vitreomacular membrane, which was thrown into folds.  The diamond-dusted membrane scraper was used to engage the membrane.  The 27-gauge pick was used to incise the membrane, and ILM forceps were used to grasp the membrane and peel it from its attachments to the entire macular region.  It was peeled from superior to inferior.  The fovea was intact.   Additional diamond-dusted membrane scraping was performed at the limits of the membrane to remove fragments of vitreous in these areas.  The BIOM viewing system was moved into place, and pars plana vitrectomy was expanded into the mid periphery and in the far periphery, all vitreous was removed.  The laser marks were visualized, and there were no retinal breaks seen.  A 30% gas- fluid exchange was carried out.  The instruments were removed from the eye.  A 9-0 nylon was used to close the sclerotomy site at 2 o'clock. The wounds at 8 and 10 o'clock were tested and found to be secure. Polymyxin and ceftazidime were injected around the globe for antibiotic coverage.  Decadron 10 mg was injected into the lower subconjunctival space.  Marcaine was injected around the globe for postop pain.  Closing tension was 10 with a Barraquer tonometer Polysporin ophthalmic ointment, a patch and shield were placed.  The patient was awakened and taken to recovery in satisfactory condition.  COMPLICATIONS:  None.  DURATION:  1 hour.     Chrystie Nose. Zigmund Daniel, M.D.     JDM/MEDQ  D:  09/06/2016  T:  09/07/2016  Job:  [id number]

## 2016-09-13 ENCOUNTER — Inpatient Hospital Stay (INDEPENDENT_AMBULATORY_CARE_PROVIDER_SITE_OTHER): Payer: Medicare Other | Admitting: Ophthalmology

## 2016-09-13 DIAGNOSIS — H35371 Puckering of macula, right eye: Secondary | ICD-10-CM

## 2016-10-06 ENCOUNTER — Encounter (INDEPENDENT_AMBULATORY_CARE_PROVIDER_SITE_OTHER): Payer: Medicare Other | Admitting: Ophthalmology

## 2016-10-06 DIAGNOSIS — H35371 Puckering of macula, right eye: Secondary | ICD-10-CM

## 2016-12-12 DIAGNOSIS — Z125 Encounter for screening for malignant neoplasm of prostate: Secondary | ICD-10-CM | POA: Diagnosis not present

## 2016-12-12 DIAGNOSIS — I10 Essential (primary) hypertension: Secondary | ICD-10-CM | POA: Diagnosis not present

## 2016-12-12 DIAGNOSIS — E784 Other hyperlipidemia: Secondary | ICD-10-CM | POA: Diagnosis not present

## 2016-12-13 DIAGNOSIS — H40013 Open angle with borderline findings, low risk, bilateral: Secondary | ICD-10-CM | POA: Diagnosis not present

## 2016-12-15 ENCOUNTER — Encounter (INDEPENDENT_AMBULATORY_CARE_PROVIDER_SITE_OTHER): Payer: Medicare Other | Admitting: Ophthalmology

## 2016-12-15 DIAGNOSIS — I10 Essential (primary) hypertension: Secondary | ICD-10-CM | POA: Diagnosis not present

## 2016-12-15 DIAGNOSIS — H35371 Puckering of macula, right eye: Secondary | ICD-10-CM | POA: Diagnosis not present

## 2016-12-15 DIAGNOSIS — H43812 Vitreous degeneration, left eye: Secondary | ICD-10-CM | POA: Diagnosis not present

## 2016-12-15 DIAGNOSIS — H35033 Hypertensive retinopathy, bilateral: Secondary | ICD-10-CM | POA: Diagnosis not present

## 2016-12-19 DIAGNOSIS — Z0000 Encounter for general adult medical examination without abnormal findings: Secondary | ICD-10-CM | POA: Diagnosis not present

## 2016-12-19 DIAGNOSIS — L989 Disorder of the skin and subcutaneous tissue, unspecified: Secondary | ICD-10-CM | POA: Diagnosis not present

## 2016-12-19 DIAGNOSIS — M5136 Other intervertebral disc degeneration, lumbar region: Secondary | ICD-10-CM | POA: Diagnosis not present

## 2016-12-19 DIAGNOSIS — E668 Other obesity: Secondary | ICD-10-CM | POA: Diagnosis not present

## 2016-12-19 DIAGNOSIS — M25561 Pain in right knee: Secondary | ICD-10-CM | POA: Diagnosis not present

## 2016-12-19 DIAGNOSIS — I10 Essential (primary) hypertension: Secondary | ICD-10-CM | POA: Diagnosis not present

## 2016-12-19 DIAGNOSIS — N183 Chronic kidney disease, stage 3 (moderate): Secondary | ICD-10-CM | POA: Diagnosis not present

## 2016-12-19 DIAGNOSIS — E784 Other hyperlipidemia: Secondary | ICD-10-CM | POA: Diagnosis not present

## 2016-12-19 DIAGNOSIS — M545 Low back pain: Secondary | ICD-10-CM | POA: Diagnosis not present

## 2016-12-19 DIAGNOSIS — Z1389 Encounter for screening for other disorder: Secondary | ICD-10-CM | POA: Diagnosis not present

## 2016-12-19 DIAGNOSIS — Z6832 Body mass index (BMI) 32.0-32.9, adult: Secondary | ICD-10-CM | POA: Diagnosis not present

## 2016-12-30 ENCOUNTER — Ambulatory Visit (INDEPENDENT_AMBULATORY_CARE_PROVIDER_SITE_OTHER): Payer: Medicare Other | Admitting: Cardiovascular Disease

## 2016-12-30 ENCOUNTER — Encounter: Payer: Self-pay | Admitting: Cardiovascular Disease

## 2016-12-30 VITALS — BP 132/85 | HR 86 | Ht 68.0 in | Wt 210.0 lb

## 2016-12-30 DIAGNOSIS — I10 Essential (primary) hypertension: Secondary | ICD-10-CM

## 2016-12-30 DIAGNOSIS — R0602 Shortness of breath: Secondary | ICD-10-CM | POA: Diagnosis not present

## 2016-12-30 NOTE — Progress Notes (Signed)
Cardiology Office Note   Date:  12/30/2016   ID:  GUSSIE TOWSON, DOB July 19, 1941, MRN [id number]  PCP:  [name], MD  Cardiologist:   Skeet Latch, MD   No chief complaint on file.     Patient ID: Frank Johnston is a 76 y.o. male with hypertension and prior pulmonary embolism who presents for follow up.  Frank Johnston was initially seen 09/2015 for dyspnea on exertion.  He initially saw his PCP, Dr. [name] 08/2015. He reported dyspnea with minimal exertion. He was noted to have trace edema on exam.  He was referred for CXR that showed mild hyperexpansion but no active disease.  BNP was 17. D-dimer was elevated at 1.6.  He was referred for CT angiography of the chest that was negative for pulmonary embolus The prior left lower lobe pulmonary embolism had resolved and his pulmonary arteries were normal in size.  No coronary calcifications were noted.  He was referred for [facility] 11/2015 that revealed a mild fixed defect in the apical and inferoseptal regions. There was no ischemia identified he had a normal ejection fraction.  He also had an echo that revealed an EF of 55-60% without wall motion abnormalities.  He was noted to have grade 1 diastolic dysfunction and mild mitral regurgitation.  Frank Johnston was referred to pulmonology where he underwent CPX testing. Ultimately his dyspnea was thought to be due to diastolic dysfunction, obesity, and deconditioning.  Since his last appointment Frank Johnston has been doing well.  His breathing has been better and he denies chest pain. He also denies lower extremity edema. He notes that his blood pressure has been elevated at times. It was 150 when he last saw Dr. Philip Aspen. He's been working on weight loss by limiting his soft drinks and improving his diet. He hopes to start walking that the weather is better. He notes that Dr. Philip Aspen recently checked his cholesterol and it was normal. His pain medications were  changed due to renal dysfunction on Mobic.    Past Medical History:  Diagnosis Date  . Arthritis   . Chest pain 10/02/2015  . Chronic kidney disease    hx kidney stones  . GERD (gastroesophageal reflux disease)   . H/O hiatal hernia   . Hypertension   . Wears glasses     Past Surgical History:  Procedure Laterality Date  . [place] VITRECTOMY WITH 20 GAUGE MVR PORT Right 09/06/2016   Procedure: 25 GAUGE PARS PLANA VITRECTOMY WITH 20 GAUGE MVR PORT;  Surgeon: [name], MD;  Location: [place];  Service: Ophthalmology;  Laterality: Right;  . APPENDECTOMY  1976  . BACK SURGERY  1984   lumbar lam  . CARPAL TUNNEL RELEASE  12/06/2011   Procedure: CARPAL TUNNEL RELEASE;  Surgeon: Wynonia Sours, MD;  Location: [place];  Service: Orthopedics;  Laterality: Right;  . CARPAL TUNNEL RELEASE  08/07/2012   Procedure: CARPAL TUNNEL RELEASE;  Surgeon: Wynonia Sours, MD;  Location: [place];  Service: Orthopedics;  Laterality: Left;  carpal tunnel release  . CHOLECYSTECTOMY  1976  . COLONOSCOPY    . JOINT REPLACEMENT  4/12   lt total knee  . KNEE ARTHROSCOPY     rt and lt  . KNEE CLOSED REDUCTION  6/12   lt -after lt total knee  . TRIGGER FINGER RELEASE  12/06/2011   Procedure: RELEASE TRIGGER FINGER/A-1 PULLEY;  Surgeon: Wynonia Sours, MD;  Location: [place];  Service: Orthopedics;  Laterality: Right;  right thumb, middle, and ring fingers  . TRIGGER FINGER RELEASE  08/07/2012   Procedure: RELEASE TRIGGER FINGER/A-1 PULLEY;  Surgeon: Wynonia Sours, MD;  Location: [place];  Service: Orthopedics;  Laterality: Left;  trigger release left ring finger     Current Outpatient Prescriptions  Medication Sig Dispense Refill  . acetaminophen (TYLENOL) 500 MG tablet Take 1,000 mg by mouth daily as needed for moderate pain or headache.    Marland Kitchen amoxicillin (AMOXIL) 500 MG capsule Take 4 capsules by mouth as needed. (Take 1 hour  prior to dental procedures)  1  . aspirin 81 MG tablet Take 81 mg by mouth every evening.     . fluticasone (FLONASE) 50 MCG/ACT nasal spray Place 2 sprays into the nose at bedtime as needed for allergies.     Marland Kitchen HYDROcodone-acetaminophen (NORCO) 5-325 MG per tablet Take 1 tablet by mouth every 6 (six) hours as needed for pain. 30 tablet 0  . metoCLOPramide (REGLAN) 5 MG tablet Take 5 mg by mouth at bedtime.     Marland Kitchen omeprazole (PRILOSEC) 40 MG capsule Take 40 mg by mouth every evening.    . Probiotic Product (PROBIOTIC ADVANCED PO) Take 1 capsule by mouth at bedtime.     . sulindac (CLINORIL) 150 MG tablet Take 150 mg by mouth 2 (two) times daily as needed for pain.    Marland Kitchen therapeutic multivitamin-minerals (THERAGRAN-M) tablet Take 1 tablet by mouth daily.    . valsartan-hydrochlorothiazide (DIOVAN-HCT) 320-12.5 MG per tablet Take 1 tablet by mouth daily.    . verapamil (CALAN-SR) 240 MG CR tablet Take 240 mg by mouth at bedtime.     . vitamin E (VITAMIN E) 400 UNIT capsule Take 400 Units by mouth daily.     No current facility-administered medications for this visit.     Allergies:   No known allergies    Social History:  The patient  reports that he quit smoking about 22 years ago. His smoking use included Cigarettes. He has a 40.00 pack-year smoking history. He has never used smokeless tobacco. He reports that he does not drink alcohol or use drugs.   Family History:  The patient's family history includes Cancer in his father; Hypertension in his child and child; Stroke in his mother.    ROS:  Please see the history of present illness.   Otherwise, review of systems are positive for none.   All other systems are reviewed and negative.    PHYSICAL EXAM: VS:  BP 132/85 (BP Location: Right Arm)   Pulse 86   Ht 5\' 8"  (1.727 m)   Wt 95.3 kg (210 lb)   BMI 31.93 kg/m  , BMI Body mass index is 31.93 kg/m. GENERAL:  Well appearing HEENT:  Pupils equal round and reactive, fundi not  visualized, oral mucosa unremarkable NECK:  No jugular venous distention, waveform within normal limits, carotid upstroke brisk and symmetric, no bruits LYMPHATICS:  No cervical adenopathy LUNGS:  Clear to auscultation bilaterally HEART:  RRR.  PMI not displaced or sustained,S1 and S2 within normal limits, no S3, no S4, no clicks, no rubs, no murmurs.  Distant heart sounds distant heart sounds ABD:  Obese, positive bowel sounds normal in frequency in pitch, no bruits, no rebound, no guarding, no midline pulsatile mass, no hepatomegaly, no splenomegaly EXT:  2 plus pulses throughout, no edema, no cyanosis no clubbing SKIN:  No rashes no nodules  NEURO:  Cranial nerves II through XII grossly intact, motor grossly intact throughout PSYCH:  Cognitively intact, oriented to person place and time  EKG:  EKG is not ordered today.  Recent Labs: 09/06/2016: BUN 25; Creatinine, Ser 1.45; Hemoglobin 16.5; Platelets 256; Potassium 4.3; Sodium 140   08/15/15: BNP 17.2  D-dimer 1.6 (normal 0-0.49) WBC 10.2, hemoglobin 16.3, hematocrit 46.3, platelets 225  sodium 140, Potassium 3.8, BUN 20, creatinine 1.1, glucose 112  Echo 11/16/15: Study Conclusions  - Left ventricle: The cavity size was normal. Wall thickness was   increased in a pattern of mild LVH. Systolic function was normal.   The estimated ejection fraction was in the range of 55% to 60%.   Wall motion was normal; there were no regional wall motion   abnormalities. Doppler parameters are consistent with abnormal   left ventricular relaxation (grade 1 diastolic dysfunction). - Mitral valve: There was mild regurgitation.   Lipid Panel No results found for: CHOL, TRIG, HDL, CHOLHDL, VLDL, LDLCALC, LDLDIRECT    Wt Readings from Last 3 Encounters:  12/30/16 95.3 kg (210 lb)  09/06/16 93.8 kg (206 lb 12.7 oz)  03/08/16 94.7 kg (208 lb 12.8 oz)      ASSESSMENT AND PLAN:  # Chest pain: Stress pain was negative for ischemia. Symptoms  have resolved.  # Shortness of breath:  Improved.  He is euvolemic on exam.   # Hypertension: Blood pressure is slightly above goal (<130/80).  We discussed working on diet and exercise. Continue to monitor for now. Continue verapamil, valsartan and HCTZ  # CV Disease prevention: Frank Johnston recently had his lipids checked with his PCP and they were reportedly at goal. Continue aspirin.    Current medicines are reviewed at length with the patient today.  The patient does not have concerns regarding medicines.  The following changes have been made:  no change  Labs/ tests ordered today include:   No orders of the defined types were placed in this encounter.    Disposition:   FU with Frank Bahner C. Oval Linsey, MD, [facility] in 1 year.   Signed, Frank Nardozzi C. Oval Linsey, MD, [facility]  12/30/2016 6:23 PM    [place]

## 2016-12-30 NOTE — Patient Instructions (Signed)

## 2017-01-17 DIAGNOSIS — D485 Neoplasm of uncertain behavior of skin: Secondary | ICD-10-CM | POA: Diagnosis not present

## 2017-01-17 DIAGNOSIS — L578 Other skin changes due to chronic exposure to nonionizing radiation: Secondary | ICD-10-CM | POA: Diagnosis not present

## 2017-01-17 DIAGNOSIS — Z85828 Personal history of other malignant neoplasm of skin: Secondary | ICD-10-CM | POA: Diagnosis not present

## 2017-01-17 DIAGNOSIS — C44319 Basal cell carcinoma of skin of other parts of face: Secondary | ICD-10-CM | POA: Diagnosis not present

## 2017-01-24 DIAGNOSIS — Z85828 Personal history of other malignant neoplasm of skin: Secondary | ICD-10-CM | POA: Diagnosis not present

## 2017-01-24 DIAGNOSIS — C44319 Basal cell carcinoma of skin of other parts of face: Secondary | ICD-10-CM | POA: Diagnosis not present

## 2017-03-06 ENCOUNTER — Telehealth: Payer: Self-pay | Admitting: *Deleted

## 2017-03-06 NOTE — Telephone Encounter (Addendum)
Pt states he feels he has an infected ingrown toenail. I spoke with pt and told him I would transfer to schedulers to get an appt, and to do 1/2 cup epsom salt to a 1 qt warm water 2x daily for 20 minutes each session and then cover area with light neosporin dressing after each soak until seen in office. I told pt that is the area worsened, more redness, swelling or pain go to the ED or PCP.Transferred pt to schedulers.

## 2017-03-09 ENCOUNTER — Encounter: Payer: Self-pay | Admitting: Podiatry

## 2017-03-09 ENCOUNTER — Ambulatory Visit (INDEPENDENT_AMBULATORY_CARE_PROVIDER_SITE_OTHER): Payer: Medicare Other | Admitting: Podiatry

## 2017-03-09 VITALS — BP 143/84 | HR 76

## 2017-03-09 DIAGNOSIS — L03032 Cellulitis of left toe: Secondary | ICD-10-CM

## 2017-03-09 DIAGNOSIS — L600 Ingrowing nail: Secondary | ICD-10-CM

## 2017-03-09 MED ORDER — CEPHALEXIN 500 MG PO CAPS: 500.0000 mg | ORAL_CAPSULE | Freq: Three times a day (TID) | ORAL | 0 refills | Status: DC

## 2017-03-09 NOTE — Progress Notes (Addendum)
   Subjective:    Patient ID: Frank Johnston, male    DOB: 12-10-1940, 76 y.o.   MRN: [id number]  HPI this patient presents the office with chief complaint of a painful big toenail, left foot. He says that it has become painful and sore and has worsened over the last week. He says he has soaked his foot in Epsom salts and applied Neosporin, but the problem persists. He presents the office today for definitive evaluation and treatment of this painful infected ingrown toenail    Review of Systems  Eyes: Positive for visual disturbance.  Musculoskeletal:       Joint pain, back pain       Objective:   Physical Exam GENERAL APPEARANCE: Alert, conversant. Appropriately groomed. No acute distress.  VASCULAR: Pedal pulses are  palpable at  [facility] and PT bilateral.  Capillary refill time is immediate to all digits,  Normal temperature gradient.  Digital hair growth is present bilateral  NEUROLOGIC: sensation is normal to 5.07 monofilament at 5/5 sites bilateral.  Light touch is intact bilateral, Muscle strength normal.  MUSCULOSKELETAL: acceptable muscle strength, tone and stability bilateral.  Intrinsic muscluature intact bilateral.  Rectus appearance of foot and digits noted bilateral.  NAILS  marked incurvation along the lateral border of the left great toe. Pus noted at the distal edge of the nail. Redness and swelling noted along the lateral and proximal nail fold DERMATOLOGIC: skin color, texture, and turgor are within normal limits.  No preulcerative lesions or ulcers  are seen, no interdigital maceration noted.  No open lesions present.  . No drainage noted.         Assessment & Plan:  Paronychia left hallux.  Ingrown toenail left hallux.  IE  Incision and drainage left hallux paronychia.  This patient was anesthetized with mixture of 2% lidocaine plain at the left hallux.  The offending nail was removed and passed off.  The nail base was phenolized and washed with alcohol. The surgical  site was then washed with betadine and alcohol.  Using a punch the lesion was excised and passed off as specimen.  The surgical site was cauterized with phenol and washed with alcohol.  The site was bandaged with neosporin, sterile 2x2 and kling.  Home instructions given. RTC 1 week.   Gardiner Barefoot DPM

## 2017-03-15 DIAGNOSIS — H01009 Unspecified blepharitis unspecified eye, unspecified eyelid: Secondary | ICD-10-CM | POA: Diagnosis not present

## 2017-03-15 DIAGNOSIS — H40013 Open angle with borderline findings, low risk, bilateral: Secondary | ICD-10-CM | POA: Diagnosis not present

## 2017-03-16 ENCOUNTER — Encounter: Payer: Self-pay | Admitting: Podiatry

## 2017-03-16 ENCOUNTER — Ambulatory Visit (INDEPENDENT_AMBULATORY_CARE_PROVIDER_SITE_OTHER): Payer: Medicare Other | Admitting: Podiatry

## 2017-03-16 DIAGNOSIS — Z09 Encounter for follow-up examination after completed treatment for conditions other than malignant neoplasm: Secondary | ICD-10-CM

## 2017-03-16 NOTE — Progress Notes (Addendum)
This patient returns to the office following nail surgery one week ago.  The patient says toe has been soaked and bandaged as directed.  There has been improvement of the toe since the surgery has been performed. The patient presents for continued evaluation and treatment.  GENERAL APPEARANCE: Alert, conversant. Appropriately groomed. No acute distress.  VASCULAR: Pedal pulses palpable at  [facility] and PT bilateral.  Capillary refill time is immediate to all digits,  Normal temperature gradient.    NEUROLOGIC: sensation is normal to 5.07 monofilament at 5/5 sites bilateral.  Light touch is intact bilateral, Muscle strength normal.  MUSCULOSKELETAL: acceptable muscle strength, tone and stability bilateral.  Intrinsic muscluature intact bilateral.  Rectus appearance of foot and digits noted bilateral.   DERMATOLOGIC: skin color, texture, and turgor are within normal limits.  No preulcerative lesions or ulcers  are seen, no interdigital maceration noted.   NAILS  There is necrotic tissue along the nail groove  In the absence of redness swelling and pain.  DX  S/p nail surgery  ROV  Home instructions were discussed.  Patient to call the office if there are any questions or concerns. Peroxide as needed.   Gardiner Barefoot DPM

## 2017-05-03 DIAGNOSIS — M1711 Unilateral primary osteoarthritis, right knee: Secondary | ICD-10-CM | POA: Diagnosis not present

## 2017-06-12 DIAGNOSIS — I10 Essential (primary) hypertension: Secondary | ICD-10-CM | POA: Diagnosis not present

## 2017-06-12 DIAGNOSIS — M25561 Pain in right knee: Secondary | ICD-10-CM | POA: Diagnosis not present

## 2017-06-12 DIAGNOSIS — M545 Low back pain: Secondary | ICD-10-CM | POA: Diagnosis not present

## 2017-06-12 DIAGNOSIS — Z6830 Body mass index (BMI) 30.0-30.9, adult: Secondary | ICD-10-CM | POA: Diagnosis not present

## 2017-06-12 DIAGNOSIS — E668 Other obesity: Secondary | ICD-10-CM | POA: Diagnosis not present

## 2017-06-12 DIAGNOSIS — N183 Chronic kidney disease, stage 3 (moderate): Secondary | ICD-10-CM | POA: Diagnosis not present

## 2017-06-12 DIAGNOSIS — Z23 Encounter for immunization: Secondary | ICD-10-CM | POA: Diagnosis not present

## 2017-06-22 ENCOUNTER — Ambulatory Visit (INDEPENDENT_AMBULATORY_CARE_PROVIDER_SITE_OTHER): Payer: Medicare Other | Admitting: Podiatry

## 2017-06-22 ENCOUNTER — Encounter: Payer: Self-pay | Admitting: Podiatry

## 2017-06-22 DIAGNOSIS — B351 Tinea unguium: Secondary | ICD-10-CM | POA: Diagnosis not present

## 2017-06-22 DIAGNOSIS — M79675 Pain in left toe(s): Secondary | ICD-10-CM | POA: Diagnosis not present

## 2017-06-22 DIAGNOSIS — L608 Other nail disorders: Secondary | ICD-10-CM

## 2017-06-22 DIAGNOSIS — M79674 Pain in right toe(s): Secondary | ICD-10-CM

## 2017-06-22 NOTE — Progress Notes (Signed)
Complaint:  Visit Type: Patient returns to my office for continued preventative foot care services. Complaint: Patient states" my nails have grown long and thick and become painful to walk and wear shoes"  The patient presents for preventative foot care services. No changes to ROS  Podiatric Exam: Vascular: dorsalis pedis and posterior tibial pulses are palpable bilateral. Capillary return is immediate. Temperature gradient is WNL. Skin turgor WNL  Sensorium: Normal Semmes Weinstein monofilament test. Normal tactile sensation bilaterally. Nail Exam: Pt has thick disfigured discolored nails with subungual debris noted bilateral entire nail halluxes. Ulcer Exam: There is no evidence of ulcer or pre-ulcerative changes or infection. Orthopedic Exam: Muscle tone and strength are WNL. No limitations in general ROM. No crepitus or effusions noted. Foot type and digits show no abnormalities. Bony prominences are unremarkable. Skin: No Porokeratosis. No infection or ulcers  Diagnosis:  Onychomycosis, , Pain in right toe, pain in left toes  Treatment & Plan Procedures and Treatment: Consent by patient was obtained for treatment procedures.   Debridement of mycotic and hypertrophic toenails, 1 through 5 bilateral and clearing of subungual debris. No ulceration, no infection noted.  Return Visit-Office Procedure: Patient instructed to return to the office for a follow up visit 3 months for continued evaluation and treatment.    Gardiner Barefoot DPM

## 2017-06-23 DIAGNOSIS — H35371 Puckering of macula, right eye: Secondary | ICD-10-CM | POA: Diagnosis not present

## 2017-06-23 DIAGNOSIS — H40013 Open angle with borderline findings, low risk, bilateral: Secondary | ICD-10-CM | POA: Diagnosis not present

## 2017-06-23 DIAGNOSIS — H26493 Other secondary cataract, bilateral: Secondary | ICD-10-CM | POA: Diagnosis not present

## 2017-06-23 DIAGNOSIS — H35033 Hypertensive retinopathy, bilateral: Secondary | ICD-10-CM | POA: Diagnosis not present

## 2017-07-03 IMAGING — NM NM MISC PROCEDURE
6 series · 36 of 36 positions shown · non-contrast
Comparison: none

[Series 1: rest · 6.51mm/px · 6 of 64 frames shown]
[frame 6/64]
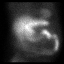
[frame 16/64]
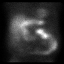
[frame 27/64]
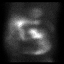
[frame 38/64]
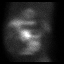
[frame 48/64]
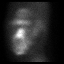
[frame 59/64]
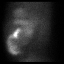

[Series 1: wbr_r-proj_st rest · 6.51mm/px · 6 of 64 frames shown]
[frame 6/64]
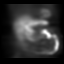
[frame 16/64]
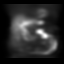
[frame 27/64]
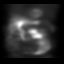
[frame 38/64]
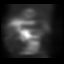
[frame 48/64]
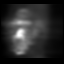
[frame 59/64]
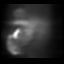

[Series 2: wbr_s-proj_st stress · 6.51mm/px · 6 of 64 frames shown (1 of 2)]
[frame 6/64]
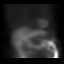
[frame 16/64]
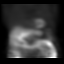
[frame 27/64]
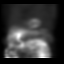
[frame 38/64]
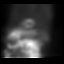
[frame 48/64]
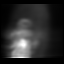
[frame 59/64]
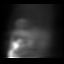

[Series 2: wbr_s-proj_st stress · 6.51mm/px · 6 of 512 frames shown (2 of 2)]
[frame 43/512]
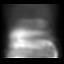
[frame 128/512]
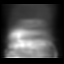
[frame 214/512]
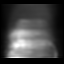
[frame 299/512]
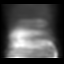
[frame 384/512]
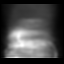
[frame 470/512]
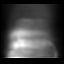

[Series 2: stress · 6.51mm/px · 6 of 512 frames shown (1 of 2)]
[frame 43/512]
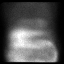
[frame 128/512]
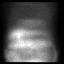
[frame 214/512]
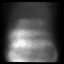
[frame 299/512]
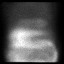
[frame 384/512]
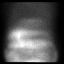
[frame 470/512]
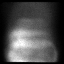

[Series 2: stress · 6.51mm/px · 6 of 64 frames shown (2 of 2)]
[frame 6/64]
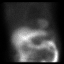
[frame 16/64]
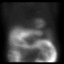
[frame 27/64]
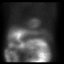
[frame 38/64]
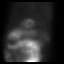
[frame 48/64]
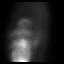
[frame 59/64]
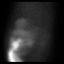

[36 of 36 positions shown; findings below may reference images not displayed]

Canned report from images found in remote index.

Refer to host system for actual result text.

## 2017-08-14 DIAGNOSIS — Z85828 Personal history of other malignant neoplasm of skin: Secondary | ICD-10-CM | POA: Diagnosis not present

## 2017-08-14 DIAGNOSIS — D1801 Hemangioma of skin and subcutaneous tissue: Secondary | ICD-10-CM | POA: Diagnosis not present

## 2017-08-14 DIAGNOSIS — D692 Other nonthrombocytopenic purpura: Secondary | ICD-10-CM | POA: Diagnosis not present

## 2017-08-14 DIAGNOSIS — D2271 Melanocytic nevi of right lower limb, including hip: Secondary | ICD-10-CM | POA: Diagnosis not present

## 2017-08-14 DIAGNOSIS — L821 Other seborrheic keratosis: Secondary | ICD-10-CM | POA: Diagnosis not present

## 2017-08-14 DIAGNOSIS — L218 Other seborrheic dermatitis: Secondary | ICD-10-CM | POA: Diagnosis not present

## 2017-08-14 DIAGNOSIS — D2272 Melanocytic nevi of left lower limb, including hip: Secondary | ICD-10-CM | POA: Diagnosis not present

## 2017-08-14 DIAGNOSIS — L918 Other hypertrophic disorders of the skin: Secondary | ICD-10-CM | POA: Diagnosis not present

## 2017-08-14 DIAGNOSIS — L812 Freckles: Secondary | ICD-10-CM | POA: Diagnosis not present

## 2017-09-07 DIAGNOSIS — M1711 Unilateral primary osteoarthritis, right knee: Secondary | ICD-10-CM | POA: Diagnosis not present

## 2017-09-15 DIAGNOSIS — H01009 Unspecified blepharitis unspecified eye, unspecified eyelid: Secondary | ICD-10-CM | POA: Diagnosis not present

## 2017-09-15 DIAGNOSIS — H40013 Open angle with borderline findings, low risk, bilateral: Secondary | ICD-10-CM | POA: Diagnosis not present

## 2017-09-15 DIAGNOSIS — H02833 Dermatochalasis of right eye, unspecified eyelid: Secondary | ICD-10-CM | POA: Diagnosis not present

## 2017-09-21 ENCOUNTER — Encounter: Payer: Self-pay | Admitting: Podiatry

## 2017-09-21 ENCOUNTER — Ambulatory Visit (INDEPENDENT_AMBULATORY_CARE_PROVIDER_SITE_OTHER): Payer: Medicare Other | Admitting: Podiatry

## 2017-09-21 ENCOUNTER — Ambulatory Visit: Payer: Medicare Other | Admitting: Podiatry

## 2017-09-21 DIAGNOSIS — L608 Other nail disorders: Secondary | ICD-10-CM

## 2017-09-21 DIAGNOSIS — M79674 Pain in right toe(s): Secondary | ICD-10-CM

## 2017-09-21 DIAGNOSIS — B351 Tinea unguium: Secondary | ICD-10-CM

## 2017-09-21 DIAGNOSIS — M79675 Pain in left toe(s): Secondary | ICD-10-CM

## 2017-09-21 NOTE — Progress Notes (Signed)
Complaint:  Visit Type: Patient returns to my office for continued preventative foot care services. Complaint: Patient states" my nails have grown long and thick and become painful to walk and wear shoes"  The patient presents for preventative foot care services. No changes to ROS  Podiatric Exam: Vascular: dorsalis pedis and posterior tibial pulses are palpable bilateral. Capillary return is immediate. Temperature gradient is WNL. Skin turgor WNL  Sensorium: Normal Semmes Weinstein monofilament test. Normal tactile sensation bilaterally. Nail Exam: Pt has thick disfigured discolored nails with subungual debris noted bilateral entire nail halluxes. Ulcer Exam: There is no evidence of ulcer or pre-ulcerative changes or infection. Orthopedic Exam: Muscle tone and strength are WNL. No limitations in general ROM. No crepitus or effusions noted. Foot type and digits show no abnormalities. Bony prominences are unremarkable. Skin: No Porokeratosis. No infection or ulcers  Diagnosis:  Onychomycosis, , Pain in right toe, pain in left toes  Treatment & Plan Procedures and Treatment: Consent by patient was obtained for treatment procedures.   Debridement of mycotic and hypertrophic toenails, 1 through 5 bilateral and clearing of subungual debris. No ulceration, no infection noted.  Return Visit-Office Procedure: Patient instructed to return to the office for a follow up visit 3 months for continued evaluation and treatment.    Gardiner Barefoot DPM

## 2017-11-17 DIAGNOSIS — H6123 Impacted cerumen, bilateral: Secondary | ICD-10-CM | POA: Diagnosis not present

## 2017-11-17 DIAGNOSIS — J330 Polyp of nasal cavity: Secondary | ICD-10-CM | POA: Diagnosis not present

## 2017-11-30 DIAGNOSIS — D367 Benign neoplasm of other specified sites: Secondary | ICD-10-CM | POA: Diagnosis not present

## 2017-11-30 DIAGNOSIS — J338 Other polyp of sinus: Secondary | ICD-10-CM | POA: Diagnosis not present

## 2017-11-30 DIAGNOSIS — J343 Hypertrophy of nasal turbinates: Secondary | ICD-10-CM | POA: Diagnosis not present

## 2017-12-01 DIAGNOSIS — M1711 Unilateral primary osteoarthritis, right knee: Secondary | ICD-10-CM | POA: Diagnosis not present

## 2017-12-01 DIAGNOSIS — M170 Bilateral primary osteoarthritis of knee: Secondary | ICD-10-CM | POA: Diagnosis not present

## 2017-12-01 DIAGNOSIS — M1712 Unilateral primary osteoarthritis, left knee: Secondary | ICD-10-CM | POA: Diagnosis not present

## 2017-12-12 DIAGNOSIS — D3709 Neoplasm of uncertain behavior of other specified sites of the oral cavity: Secondary | ICD-10-CM | POA: Diagnosis not present

## 2017-12-15 DIAGNOSIS — H40013 Open angle with borderline findings, low risk, bilateral: Secondary | ICD-10-CM | POA: Diagnosis not present

## 2017-12-15 DIAGNOSIS — E7849 Other hyperlipidemia: Secondary | ICD-10-CM | POA: Diagnosis not present

## 2017-12-15 DIAGNOSIS — R82998 Other abnormal findings in urine: Secondary | ICD-10-CM | POA: Diagnosis not present

## 2017-12-15 DIAGNOSIS — Z125 Encounter for screening for malignant neoplasm of prostate: Secondary | ICD-10-CM | POA: Diagnosis not present

## 2017-12-15 DIAGNOSIS — H35033 Hypertensive retinopathy, bilateral: Secondary | ICD-10-CM | POA: Diagnosis not present

## 2017-12-15 DIAGNOSIS — I10 Essential (primary) hypertension: Secondary | ICD-10-CM | POA: Diagnosis not present

## 2017-12-15 DIAGNOSIS — H35371 Puckering of macula, right eye: Secondary | ICD-10-CM | POA: Diagnosis not present

## 2017-12-15 DIAGNOSIS — Z961 Presence of intraocular lens: Secondary | ICD-10-CM | POA: Diagnosis not present

## 2017-12-18 DIAGNOSIS — K115 Sialolithiasis: Secondary | ICD-10-CM | POA: Diagnosis not present

## 2017-12-18 DIAGNOSIS — K1120 Sialoadenitis, unspecified: Secondary | ICD-10-CM | POA: Diagnosis not present

## 2017-12-20 ENCOUNTER — Ambulatory Visit (INDEPENDENT_AMBULATORY_CARE_PROVIDER_SITE_OTHER): Payer: Medicare Other | Admitting: Ophthalmology

## 2017-12-20 DIAGNOSIS — H401122 Primary open-angle glaucoma, left eye, moderate stage: Secondary | ICD-10-CM | POA: Diagnosis not present

## 2017-12-20 DIAGNOSIS — H35033 Hypertensive retinopathy, bilateral: Secondary | ICD-10-CM | POA: Diagnosis not present

## 2017-12-20 DIAGNOSIS — H35371 Puckering of macula, right eye: Secondary | ICD-10-CM | POA: Diagnosis not present

## 2017-12-20 DIAGNOSIS — I10 Essential (primary) hypertension: Secondary | ICD-10-CM | POA: Diagnosis not present

## 2017-12-20 DIAGNOSIS — H43813 Vitreous degeneration, bilateral: Secondary | ICD-10-CM | POA: Diagnosis not present

## 2017-12-21 ENCOUNTER — Ambulatory Visit (INDEPENDENT_AMBULATORY_CARE_PROVIDER_SITE_OTHER): Payer: Medicare Other | Admitting: Podiatry

## 2017-12-21 ENCOUNTER — Encounter: Payer: Self-pay | Admitting: Podiatry

## 2017-12-21 DIAGNOSIS — M79674 Pain in right toe(s): Secondary | ICD-10-CM

## 2017-12-21 DIAGNOSIS — M79675 Pain in left toe(s): Secondary | ICD-10-CM | POA: Diagnosis not present

## 2017-12-21 DIAGNOSIS — L600 Ingrowing nail: Secondary | ICD-10-CM

## 2017-12-21 DIAGNOSIS — L608 Other nail disorders: Secondary | ICD-10-CM

## 2017-12-21 DIAGNOSIS — B351 Tinea unguium: Secondary | ICD-10-CM | POA: Diagnosis not present

## 2017-12-21 NOTE — Progress Notes (Signed)
Complaint:  Visit Type: Patient returns to my office for continued preventative foot care services. Complaint: Patient states" my nails have grown long and thick and become painful to walk and wear shoes"  The patient presents for preventative foot care services. No changes to ROS  Podiatric Exam: Vascular: dorsalis pedis and posterior tibial pulses are palpable bilateral. Capillary return is immediate. Temperature gradient is WNL. Skin turgor WNL  Sensorium: Normal Semmes Weinstein monofilament test. Normal tactile sensation bilaterally. Nail Exam: Pt has thick disfigured discolored nails with subungual debris noted bilateral entire nail halluxes. Ulcer Exam: There is no evidence of ulcer or pre-ulcerative changes or infection. Orthopedic Exam: Muscle tone and strength are WNL. No limitations in general ROM. No crepitus or effusions noted. Foot type and digits show no abnormalities. Bony prominences are unremarkable. Skin: No Porokeratosis. No infection or ulcers  Diagnosis:  Onychomycosis, , Pain in right toe, pain in left toes  Treatment & Plan Procedures and Treatment: Consent by patient was obtained for treatment procedures.   Debridement of mycotic and hypertrophic toenails, 1 through 5 bilateral and clearing of subungual debris. No ulceration, no infection noted. To continue to monitor and possibly schedule nail surgery for permanent correction ingrown toenails both great toes. Return Visit-Office Procedure: Patient instructed to return to the office for a follow up visit 3 months for continued evaluation and treatment.    Gardiner Barefoot DPM

## 2017-12-25 DIAGNOSIS — H40013 Open angle with borderline findings, low risk, bilateral: Secondary | ICD-10-CM | POA: Diagnosis not present

## 2017-12-25 DIAGNOSIS — H40011 Open angle with borderline findings, low risk, right eye: Secondary | ICD-10-CM | POA: Diagnosis not present

## 2018-01-08 DIAGNOSIS — Z860101 Personal history of adenomatous and serrated colon polyps: Secondary | ICD-10-CM | POA: Insufficient documentation

## 2018-01-08 DIAGNOSIS — Z86010 Personal history of colonic polyps: Secondary | ICD-10-CM | POA: Diagnosis not present

## 2018-01-08 DIAGNOSIS — K648 Other hemorrhoids: Secondary | ICD-10-CM | POA: Diagnosis not present

## 2018-01-08 DIAGNOSIS — Z1211 Encounter for screening for malignant neoplasm of colon: Secondary | ICD-10-CM | POA: Diagnosis not present

## 2018-01-09 DIAGNOSIS — M545 Low back pain: Secondary | ICD-10-CM | POA: Diagnosis not present

## 2018-01-09 DIAGNOSIS — Z0000 Encounter for general adult medical examination without abnormal findings: Secondary | ICD-10-CM | POA: Diagnosis not present

## 2018-01-09 DIAGNOSIS — I10 Essential (primary) hypertension: Secondary | ICD-10-CM | POA: Diagnosis not present

## 2018-01-09 DIAGNOSIS — E668 Other obesity: Secondary | ICD-10-CM | POA: Diagnosis not present

## 2018-01-09 DIAGNOSIS — M25561 Pain in right knee: Secondary | ICD-10-CM | POA: Diagnosis not present

## 2018-01-09 DIAGNOSIS — Z23 Encounter for immunization: Secondary | ICD-10-CM | POA: Diagnosis not present

## 2018-01-09 DIAGNOSIS — Z1389 Encounter for screening for other disorder: Secondary | ICD-10-CM | POA: Diagnosis not present

## 2018-01-09 DIAGNOSIS — N183 Chronic kidney disease, stage 3 (moderate): Secondary | ICD-10-CM | POA: Diagnosis not present

## 2018-01-24 DIAGNOSIS — K648 Other hemorrhoids: Secondary | ICD-10-CM | POA: Diagnosis not present

## 2018-01-30 ENCOUNTER — Ambulatory Visit (INDEPENDENT_AMBULATORY_CARE_PROVIDER_SITE_OTHER): Payer: Medicare Other | Admitting: Cardiovascular Disease

## 2018-01-30 ENCOUNTER — Encounter: Payer: Self-pay | Admitting: Cardiovascular Disease

## 2018-01-30 VITALS — BP 152/82 | HR 69 | Ht 68.0 in | Wt 202.2 lb

## 2018-01-30 DIAGNOSIS — I10 Essential (primary) hypertension: Secondary | ICD-10-CM | POA: Diagnosis not present

## 2018-01-30 MED ORDER — AMLODIPINE BESYLATE 10 MG PO TABS: 10.0000 mg | ORAL_TABLET | Freq: Every day | ORAL | 1 refills | Status: DC

## 2018-01-30 NOTE — Progress Notes (Signed)
Cardiology Office Note   Date:  01/30/2018   ID:  Frank Johnston, DOB 1941/01/01, MRN [id number]  PCP:  Leanna Battles, MD  Cardiologist:   Skeet Latch, MD   No chief complaint on file.   Patient ID: Frank Johnston is a 77 y.o. male with hypertension and prior pulmonary embolism who presents for follow up.  Frank Johnston was initially seen 09/2015 for dyspnea on exertion.  He initially saw his PCP, Frank Johnston 08/2015. He reported dyspnea with minimal exertion. He was noted to have trace edema on exam.  He was referred for CXR that showed mild hyperexpansion but no active disease.  BNP was 17. D-dimer was elevated at 1.6.  He was referred for CT angiography of the chest that was negative for pulmonary embolus The prior left lower lobe pulmonary embolism had resolved and his pulmonary arteries were normal in size.  No coronary calcifications were noted.  He was referred for Frank [facility] 11/2015 that revealed a mild fixed defect in the apical and inferoseptal regions. There was no ischemia identified he had a normal ejection fraction.  He also had an echo that revealed an EF of 55-60% without wall motion abnormalities.  He was noted to have grade 1 diastolic dysfunction and mild mitral regurgitation.  Frank Johnston was referred to pulmonology where he underwent CPX testing. Ultimately his dyspnea was thought to be due to diastolic dysfunction, obesity, and deconditioning.  Since his last appointment Frank Johnston has been feeling well.  He gets exercise through yard work.  He has no exertional symptoms.  He has not noted any lower extremity edema, orthopnea, or PND.  He saw his PCP last week and his blood pressure was in the 130s.  It is typically in the 130s to 140s.  He had cholesterol drawn at that time.  His only complaints today are orthopedic issues.   Past Medical History:  Diagnosis Date  . Arthritis   . Chest pain 10/02/2015  . Chronic kidney disease    hx  kidney stones  . GERD (gastroesophageal reflux disease)   . H/O hiatal hernia   . Hypertension   . Wears glasses     Past Surgical History:  Procedure Laterality Date  . [place] VITRECTOMY WITH 20 GAUGE MVR PORT Right 09/06/2016   Procedure: 25 GAUGE [place]ARS [place] VITRECTOMY WITH 20 GAUGE MVR PORT;  Surgeon: [name], MD;  Location: [place];  Service: Ophthalmology;  Laterality: Right;  . APPENDECTOMY  1976  . BACK SURGERY  1984   lumbar lam  . CARPAL TUNNEL RELEASE  12/06/2011   Procedure: CARPAL TUNNEL RELEASE;  Surgeon: Wynonia Sours, MD;  Location: [place];  Service: Orthopedics;  Laterality: Right;  . CARPAL TUNNEL RELEASE  08/07/2012   Procedure: CARPAL TUNNEL RELEASE;  Surgeon: Wynonia Sours, MD;  Location: [place];  Service: Orthopedics;  Laterality: Left;  carpal tunnel release  . CHOLECYSTECTOMY  1976  . COLONOSCOPY    . JOINT REPLACEMENT  4/12   lt total knee  . KNEE ARTHROSCOPY     rt and lt  . KNEE CLOSED REDUCTION  6/12   lt -after lt total knee  . TRIGGER FINGER RELEASE  12/06/2011   Procedure: RELEASE TRIGGER FINGER/A-1 PULLEY;  Surgeon: Wynonia Sours, MD;  Location: [place];  Service: Orthopedics;  Laterality: Right;  right thumb, middle, and ring fingers  . TRIGGER FINGER RELEASE  08/07/2012   Procedure: RELEASE TRIGGER FINGER/A-1 PULLEY;  Surgeon: Wynonia Sours, MD;  Location: [place];  Service: Orthopedics;  Laterality: Left;  trigger release left ring finger     Current Outpatient Medications  Medication Sig Dispense Refill  . acetaminophen (TYLENOL) 500 MG tablet Take 1,000 mg by mouth daily as needed for moderate pain or headache.    Marland Kitchen aspirin 81 MG tablet Take 81 mg by mouth every evening.     . fluticasone (FLONASE) 50 MCG/ACT nasal spray Place 2 sprays into the nose at bedtime as needed for allergies.     Marland Kitchen HYDROcodone-acetaminophen (NORCO) 5-325 MG per tablet Take 1 tablet by  mouth every 6 (six) hours as needed for pain. 30 tablet 0  . metoCLOPramide (REGLAN) 5 MG tablet Take 5 mg by mouth at bedtime.     Marland Kitchen omeprazole (PRILOSEC) 40 MG capsule Take 40 mg by mouth every evening.    . Probiotic Product (PROBIOTIC ADVANCED PO) Take 1 capsule by mouth at bedtime.     . sulindac (CLINORIL) 150 MG tablet Take 150 mg by mouth 2 (two) times daily as needed for pain.    Marland Kitchen therapeutic multivitamin-minerals (THERAGRAN-M) tablet Take 1 tablet by mouth daily.    . valsartan-hydrochlorothiazide (DIOVAN-HCT) 320-12.5 MG per tablet Take 1 tablet by mouth daily.    . vitamin E (VITAMIN E) 400 UNIT capsule Take 400 Units by mouth daily.    Marland Kitchen amLODipine (NORVASC) 10 MG tablet Take 1 tablet (10 mg total) by mouth daily. 90 tablet 1   No current facility-administered medications for this visit.     Allergies:   No known allergies    Social History:  The patient  reports that he quit smoking about 23 years ago. His smoking use included cigarettes. He has a 40.00 pack-year smoking history. He has never used smokeless tobacco. He reports that he does not drink alcohol or use drugs.   Family History:  The patient's family history includes Cancer in his father; Hypertension in his child and child; Stroke in his mother.    ROS:  Please see the history of present illness.   Otherwise, review of systems are positive for none.   All other systems are reviewed and negative.    PHYSICAL EXAM:  EKG:  EKG is ordered today. 01/30/18: Sinus rhythm.  Rate 69 bpm.  First degree AV block.   Recent Labs: No results found for requested labs within last 8760 hours.   08/15/15: BNP 17.2  D-dimer 1.6 (normal 0-0.49) WBC 10.2, hemoglobin 16.3, hematocrit 46.3, platelets 225  sodium 140, Potassium 3.8, BUN 20, creatinine 1.1, glucose 112  Echo 11/16/15: Study Conclusions  - Left ventricle: The cavity size was normal. Wall thickness was   increased in a pattern of mild LVH. Systolic function  was normal.   The estimated ejection fraction was in the range of 55% to 60%.   Wall motion was normal; there were no regional wall motion   abnormalities. Doppler parameters are consistent with abnormal   left ventricular relaxation (grade 1 diastolic dysfunction). - Mitral valve: There was mild regurgitation.   Lipid Panel No results found for: CHOL, TRIG, HDL, CHOLHDL, VLDL, LDLCALC, LDLDIRECT   12/15/2017: Total cholesterol 170, LDL, HDL 59 BUN 24, creatinine 1.3 AST 19, ALT 21   Wt Readings from Last 3 Encounters:  01/30/18 202 lb 3.2 oz (91.7 kg)  12/30/16 210 lb (95.3 kg)  09/06/16 206 lb 12.7 oz (93.8 kg)  ASSESSMENT AND PLAN:  # Hypertension: Blood pressure is consistently above goal.  He should be less than 130/80.  We will stop verapamil and start amlodipine 10 mg daily.  Continue losartan/HCTZ.  He reports that his medication has not been affected by the recall according to his pharmacy.  He will check his blood pressures at home and bring them to his appointment with our pharmacist in 1 month.  # CV Disease prevention: Lipids followed by Dr. Philip Aspen.    Current medicines are reviewed at length with the patient today.  The patient does not have concerns regarding medicines.  The following changes have been made:  no change  Labs/ tests ordered today include:   No orders of the defined types were placed in this encounter.    Disposition:   FU with Bode Pieper C. Oval Linsey, MD, [facility] in 6 months.   Signed, Dawit Tankard C. Oval Linsey, MD, [facility]  01/30/2018 10:12 AM    [place]

## 2018-01-30 NOTE — Addendum Note (Signed)
Addended by: Alvina Filbert B on: 01/30/2018 03:49 PM   Modules accepted: Orders

## 2018-01-30 NOTE — Patient Instructions (Addendum)
Medication Instructions:  STOP VERAPAMIL   START AMLODIPINE 10 MG DAILY   Labwork: NONE  Testing/Procedures: NONE  Follow-Up: Your physician recommends that you schedule a follow-up appointment in: [place] physician wants you to follow-up in: [place] [address] [place] will receive a reminder letter in the mail two months in advance. If you don't receive a letter, please call our office to schedule the follow-up appointment.  Any Other Special Instructions Will Be Listed Below (If Applicable).  MONITOR AND LOG YOUR BLOOD PRESSURE AT HOME. BRING READINGS TO YOUR FOLLOW UP    If you need a refill on your cardiac medications before your next appointment, please call your pharmacy.

## 2018-02-05 DIAGNOSIS — H40013 Open angle with borderline findings, low risk, bilateral: Secondary | ICD-10-CM | POA: Diagnosis not present

## 2018-02-07 DIAGNOSIS — K648 Other hemorrhoids: Secondary | ICD-10-CM | POA: Diagnosis not present

## 2018-02-21 DIAGNOSIS — K648 Other hemorrhoids: Secondary | ICD-10-CM | POA: Diagnosis not present

## 2018-03-01 ENCOUNTER — Ambulatory Visit (INDEPENDENT_AMBULATORY_CARE_PROVIDER_SITE_OTHER): Payer: Medicare Other | Admitting: Podiatry

## 2018-03-01 ENCOUNTER — Ambulatory Visit (INDEPENDENT_AMBULATORY_CARE_PROVIDER_SITE_OTHER): Payer: Medicare Other | Admitting: Pharmacist Clinician (PhC)/ Clinical Pharmacy Specialist

## 2018-03-01 ENCOUNTER — Encounter: Payer: Self-pay | Admitting: Podiatry

## 2018-03-01 DIAGNOSIS — I10 Essential (primary) hypertension: Secondary | ICD-10-CM

## 2018-03-01 DIAGNOSIS — M79675 Pain in left toe(s): Secondary | ICD-10-CM

## 2018-03-01 DIAGNOSIS — M79674 Pain in right toe(s): Secondary | ICD-10-CM

## 2018-03-01 DIAGNOSIS — B351 Tinea unguium: Secondary | ICD-10-CM | POA: Diagnosis not present

## 2018-03-01 DIAGNOSIS — L608 Other nail disorders: Secondary | ICD-10-CM

## 2018-03-01 DIAGNOSIS — L600 Ingrowing nail: Secondary | ICD-10-CM

## 2018-03-01 MED ORDER — SPIRONOLACTONE 25 MG PO TABS: 12.5000 mg | ORAL_TABLET | Freq: Every day | ORAL | 1 refills | Status: DC

## 2018-03-01 MED ORDER — SPIRONOLACTONE 25 MG PO TABS: 25.0000 mg | ORAL_TABLET | Freq: Every day | ORAL | 3 refills | Status: DC

## 2018-03-01 NOTE — Patient Instructions (Addendum)
Return for a a follow up appointment in 3 weeks  Your blood pressure today is 148/82  (goal is < 130/80)   Check your blood pressure at home daily and keep record of the readings.  Go to the lab and get blood work done about 10-14 days after starting the spironolactone.  Take your BP meds as follows:  Continue valsartan hctz 320/12.5 mg once daily  Start spironolactone 12.5 mg (1/2 tablet) once daily  Bring all of your meds, your BP cuff and your record of home blood pressures to your next appointment.  Exercise as you're able, try to walk approximately 30 minutes per day.  Keep salt intake to a minimum, especially watch canned and prepared boxed foods.  Eat more fresh fruits and vegetables and fewer canned items.  Avoid eating in fast food restaurants.    HOW TO TAKE YOUR BLOOD PRESSURE: . Rest 5 minutes before taking your blood pressure. .  Don't smoke or drink caffeinated beverages for at least 30 minutes before. . Take your blood pressure before (not after) you eat. . Sit comfortably with your back supported and both feet on the floor (don't cross your legs). . Elevate your arm to heart level on a table or a desk. . Use the proper sized cuff. It should fit smoothly and snugly around your bare upper arm. There should be enough room to slip a fingertip under the cuff. The bottom edge of the cuff should be 1 inch above the crease of the elbow. . Ideally, take 3 measurements at one sitting and record the average.

## 2018-03-01 NOTE — Progress Notes (Signed)
Complaint:  Visit Type: Patient returns to my office for continued preventative foot care services. Complaint: Patient states" my nails have grown long and thick and become painful to walk and wear shoes"  The patient presents for preventative foot care services. No changes to ROS  Podiatric Exam: Vascular: dorsalis pedis and posterior tibial pulses are palpable bilateral. Capillary return is immediate. Temperature gradient is WNL. Skin turgor WNL  Sensorium: Normal Semmes Weinstein monofilament test. Normal tactile sensation bilaterally. Nail Exam: Pt has thick disfigured discolored nails with subungual debris noted bilateral entire nail halluxes. Ulcer Exam: There is no evidence of ulcer or pre-ulcerative changes or infection. Orthopedic Exam: Muscle tone and strength are WNL. No limitations in general ROM. No crepitus or effusions noted. Foot type and digits show no abnormalities. Bony prominences are unremarkable. Skin: No Porokeratosis. No infection or ulcers  Diagnosis:  Onychomycosis, , Pain in right toe, pain in left toes  Treatment & Plan Procedures and Treatment: Consent by patient was obtained for treatment procedures.   Debridement of mycotic and hypertrophic toenails, 1 through 5 bilateral and clearing of subungual debris. No ulceration, no infection noted. To continue to monitor and possibly schedule nail surgery for permanent correction ingrown toenails both great toes. Return Visit-Office Procedure: Patient instructed to return to the office for a follow up visit 10 weeks  for continued evaluation and treatment.    Emilija Bohman DPM 

## 2018-03-01 NOTE — Progress Notes (Signed)
03/02/2018 RAYMOND BHARDWAJ 01-May-1941 [id number]   HPI:  KROSS SWALLOWS is a 77 y.o. male patient of Dr Oval Linsey, with a PMH below who presents today for hypertension clinic evaluation.  His medical history is significant only for prior PE, dyspepsia and seasonal allergies.    At his last visit with Dr. Oval Linsey his verapamil 240 mg was switched to amlodipine 10 mg for better BP control.  He was to record home readings for a month and returns today for follow up.  He has noted an increase in lower extremity edema over the past couple of weeks since switching to amlodipine.   Blood Pressure Goal:  130/80  Current Medications:  Valsartan/hctz 320/12.5 mg qd  Amlodipine 10 mg qd   Family Hx:  Mother with hypertension - died at 70 probable stroke  Father died at 43 from asbestos related lung cancer  Brother with hypertension  Daughter with hypertension  Social Hx:  No tobacco, quit in 1996; no alcohol; 2 coffee at most in the mornings; occasional sweet tea or soda  Diet:  Mostly home cooked meals, only adds salt to a few items; veggies are mostly fresh in spring/summer. Some canned in winter;  Plenty of chicken, some beef and pork, rare fish; eats some cracker, chips  Exercise:  Mows yard regularly, stays busy around house  Home BP readings:  Home cuff several years old, arm cuff, took 11 morning readings (average 125/74) 15 evening readings (average 119/75)  Intolerances  Knows took med several years ago that caused cough - I assume ACEI  CrCl cannot be calculated (Patient's most recent lab result is older than the maximum 21 days allowed.).  Wt Readings from Last 3 Encounters:  01/30/18 202 lb 3.2 oz (91.7 kg)  12/30/16 210 lb (95.3 kg)  09/06/16 206 lb 12.7 oz (93.8 kg)   BP Readings from Last 3 Encounters:  01/30/18 (!) 152/82  03/09/17 (!) 143/84  12/30/16 132/85   Pulse Readings from Last 3 Encounters:  01/30/18 69  03/09/17 76  12/30/16 86     Current Outpatient Medications  Medication Sig Dispense Refill  . acetaminophen (TYLENOL) 500 MG tablet Take 1,000 mg by mouth daily as needed for moderate pain or headache.    Marland Kitchen amLODipine (NORVASC) 10 MG tablet Take 1 tablet (10 mg total) by mouth daily. 90 tablet 1  . aspirin 81 MG tablet Take 81 mg by mouth every evening.     . fluticasone (FLONASE) 50 MCG/ACT nasal spray Place 2 sprays into the nose at bedtime as needed for allergies.     Marland Kitchen HYDROcodone-acetaminophen (NORCO) 5-325 MG per tablet Take 1 tablet by mouth every 6 (six) hours as needed for pain. 30 tablet 0  . metoCLOPramide (REGLAN) 5 MG tablet Take 5 mg by mouth at bedtime.     Marland Kitchen omeprazole (PRILOSEC) 40 MG capsule Take 40 mg by mouth every evening.    . Probiotic Product (PROBIOTIC ADVANCED PO) Take 1 capsule by mouth at bedtime.     Marland Kitchen spironolactone (ALDACTONE) 25 MG tablet Take 0.5 tablets (12.5 mg total) by mouth daily. 15 tablet 1  . sulindac (CLINORIL) 150 MG tablet Take 150 mg by mouth 2 (two) times daily as needed for pain.    Marland Kitchen therapeutic multivitamin-minerals (THERAGRAN-M) tablet Take 1 tablet by mouth daily.    . valsartan-hydrochlorothiazide (DIOVAN-HCT) 320-12.5 MG per tablet Take 1 tablet by mouth daily.    . vitamin E (VITAMIN E) 400 UNIT  capsule Take 400 Units by mouth daily.     No current facility-administered medications for this visit.     Allergies  Allergen Reactions  . No Known Allergies     Past Medical History:  Diagnosis Date  . Arthritis   . Chest pain 10/02/2015  . Chronic kidney disease    hx kidney stones  . GERD (gastroesophageal reflux disease)   . H/O hiatal hernia   . Hypertension   . Wears glasses     There were no vitals taken for this visit.  HYPERTENSION Patient with hypertension, well controlled on current regimen.  Unfortunately amlodipine has caused some lower extremity edema for him.  Will discontinue that today and start him on spironolactone 25 mg daily.  He  will need to get a metabolic panel drawn in about 10-14 days.   He should continue with home BP checks and we will see him back in 3 weeks to be sure he continues to have good control.  We could put him back on lower doses of either verapamil or amlodipine in the future should the spironolactone not keep him at goal.    Tommy Medal PharmD CPP [facility] [address] [place], [place] [address] [phone]

## 2018-03-02 ENCOUNTER — Encounter: Payer: Self-pay | Admitting: Pharmacist Clinician (PhC)/ Clinical Pharmacy Specialist

## 2018-03-02 NOTE — Assessment & Plan Note (Signed)
Patient with hypertension, well controlled on current regimen.  Unfortunately amlodipine has caused some lower extremity edema for him.  Will discontinue that today and start him on spironolactone 25 mg daily.  He will need to get a metabolic panel drawn in about 10-14 days.   He should continue with home BP checks and we will see him back in 3 weeks to be sure he continues to have good control.  We could put him back on lower doses of either verapamil or amlodipine in the future should the spironolactone not keep him at goal.

## 2018-03-14 DIAGNOSIS — I10 Essential (primary) hypertension: Secondary | ICD-10-CM | POA: Diagnosis not present

## 2018-03-14 LAB — BASIC METABOLIC PANEL
BUN/Creatinine Ratio: 19 (ref 10–24)
BUN: 29 mg/dL — ABNORMAL HIGH (ref 8–27)
CALCIUM: 10.2 mg/dL (ref 8.6–10.2)
CHLORIDE: 99 mmol/L (ref 96–106)
CO2: 23 mmol/L (ref 20–29)
Creatinine, Ser: 1.56 mg/dL — ABNORMAL HIGH (ref 0.76–1.27)
GFR calc Af Amer: 48 mL/min/{1.73_m2} — ABNORMAL LOW (ref >59)
GFR calc non Af Amer: 42 mL/min/{1.73_m2} — ABNORMAL LOW (ref >59)
GLUCOSE: 115 mg/dL — AB (ref 65–99)
POTASSIUM: 4.3 mmol/L (ref 3.5–5.2)
Sodium: 138 mmol/L (ref 134–144)

## 2018-03-22 ENCOUNTER — Ambulatory Visit (INDEPENDENT_AMBULATORY_CARE_PROVIDER_SITE_OTHER): Payer: Medicare Other | Admitting: Pharmacist Clinician (PhC)/ Clinical Pharmacy Specialist

## 2018-03-22 ENCOUNTER — Encounter: Payer: Self-pay | Admitting: Pharmacist Clinician (PhC)/ Clinical Pharmacy Specialist

## 2018-03-22 DIAGNOSIS — I10 Essential (primary) hypertension: Secondary | ICD-10-CM | POA: Diagnosis not present

## 2018-03-22 NOTE — Progress Notes (Signed)
03/22/2018 Frank Johnston May 06, 1941 [id number]   HPI:  Frank Johnston is a 77 y.o. male patient of Dr Oval Linsey, with a Roby below who presents today for hypertension clinic evaluation.  His medical history is significant only for prior PE, dyspepsia and seasonal allergies.    At his last visit with Dr. Oval Linsey his verapamil 240 mg was switched to amlodipine 10 mg for better BP control.  Unfortunately this caused him to have lower extremity edema, so at his last visit in [facility] it was discontinued and he was started on spironolactone.  He had labs drawn after starting this and there was no significant change in his serum creatinine or potassium.  He returns today for follow up.    Blood Pressure Goal:  130/80  Current Medications:  Valsartan/hctz 320/12.5 mg qd  Spironolactone 12.5 mg qd   Family Hx:  Mother with hypertension - died at 3 probable stroke  Father died at 24 from asbestos related lung cancer  Brother with hypertension  Daughter with hypertension  Social Hx:  No tobacco, quit in 1996; no alcohol; 2 coffee at most in the mornings; occasional sweet tea or soda  Diet:  Mostly home cooked meals, only adds salt to a few items; veggies are mostly fresh in spring/summer. Some canned in winter;  Plenty of chicken, some beef and pork, rare fish; eats some cracker, chips  Exercise:  Mows yard regularly, stays busy around house  Home BP readings:  Home cuff several years old,  Read within 5 points of the office meter;  arm cuff, took 21 morning readings (average 132/78) and 21 evening readings (average 125/74)   Intolerances  Knows took med several years ago that caused cough -  assume ACEI  CrCl cannot be calculated (Unknown ideal weight.).  Wt Readings from Last 3 Encounters:  01/30/18 202 lb 3.2 oz (91.7 kg)  12/30/16 210 lb (95.3 kg)  09/06/16 206 lb 12.7 oz (93.8 kg)   BP Readings from Last 3 Encounters:  03/22/18 128/76  01/30/18 (!) 152/82  03/09/17  (!) 143/84   Pulse Readings from Last 3 Encounters:  03/22/18 86  01/30/18 69  03/09/17 76    Current Outpatient Medications  Medication Sig Dispense Refill  . acetaminophen (TYLENOL) 500 MG tablet Take 1,000 mg by mouth daily as needed for moderate pain or headache.    Marland Kitchen aspirin 81 MG tablet Take 81 mg by mouth every evening.     . fluticasone (FLONASE) 50 MCG/ACT nasal spray Place 2 sprays into the nose at bedtime as needed for allergies.     Marland Kitchen HYDROcodone-acetaminophen (NORCO) 5-325 MG per tablet Take 1 tablet by mouth every 6 (six) hours as needed for pain. 30 tablet 0  . metoCLOPramide (REGLAN) 5 MG tablet Take 5 mg by mouth at bedtime.     Marland Kitchen omeprazole (PRILOSEC) 40 MG capsule Take 40 mg by mouth every evening.    . Probiotic Product (PROBIOTIC ADVANCED PO) Take 1 capsule by mouth at bedtime.     Marland Kitchen spironolactone (ALDACTONE) 25 MG tablet Take 0.5 tablets (12.5 mg total) by mouth daily. 15 tablet 1  . sulindac (CLINORIL) 150 MG tablet Take 150 mg by mouth 2 (two) times daily as needed for pain.    Marland Kitchen therapeutic multivitamin-minerals (THERAGRAN-M) tablet Take 1 tablet by mouth daily.    . valsartan-hydrochlorothiazide (DIOVAN-HCT) 320-12.5 MG per tablet Take 1 tablet by mouth daily.    . vitamin E (VITAMIN E) 400  UNIT capsule Take 400 Units by mouth daily.     No current facility-administered medications for this visit.     Allergies  Allergen Reactions  . No Known Allergies     Past Medical History:  Diagnosis Date  . Arthritis   . Chest pain 10/02/2015  . Chronic kidney disease    hx kidney stones  . GERD (gastroesophageal reflux disease)   . H/O hiatal hernia   . Hypertension   . Wears glasses     Blood pressure 128/76, pulse 86.  Essential hypertension Patient with essential hypertension, currently well controlled with valsartan/hctz and spironolactone.  He is encouraged to continue with home blood pressure checks several times each week and let us know should  they start trending to 140 or greater systolic.     Tommy Medal PharmD CPP [facility] [address] [place], [place] [address] [phone]

## 2018-03-22 NOTE — Assessment & Plan Note (Signed)
Patient with essential hypertension, currently well controlled with valsartan/hctz and spironolactone.  He is encouraged to continue with home blood pressure checks several times each week and let us know should they start trending to 140 or greater systolic.

## 2018-03-22 NOTE — Patient Instructions (Signed)
  Your blood pressure today is 128/76  Check your blood pressure at home and keep record of the readings.  If you notice it trending upward to > 140 on multiple occasions, please give [place] a call  Take your BP meds as follows:  No changes to your medication  Bring all of your meds, your BP cuff and your record of home blood pressures to your next appointment.  Exercise as you're able, try to walk approximately 30 minutes per day.  Keep salt intake to a minimum, especially watch canned and prepared boxed foods.  Eat more fresh fruits and vegetables and fewer canned items.  Avoid eating in fast food restaurants.    HOW TO TAKE YOUR BLOOD PRESSURE: . Rest 5 minutes before taking your blood pressure. .  Don't smoke or drink caffeinated beverages for at least 30 minutes before. . Take your blood pressure before (not after) you eat. . Sit comfortably with your back supported and both feet on the floor (don't cross your legs). . Elevate your arm to heart level on a table or a desk. . Use the proper sized cuff. It should fit smoothly and snugly around your bare upper arm. There should be enough room to slip a fingertip under the cuff. The bottom edge of the cuff should be 1 inch above the crease of the elbow. . Ideally, take 3 measurements at one sitting and record the average.

## 2018-04-10 DIAGNOSIS — M1711 Unilateral primary osteoarthritis, right knee: Secondary | ICD-10-CM | POA: Diagnosis not present

## 2018-04-13 DIAGNOSIS — H40013 Open angle with borderline findings, low risk, bilateral: Secondary | ICD-10-CM | POA: Diagnosis not present

## 2018-04-17 DIAGNOSIS — M1711 Unilateral primary osteoarthritis, right knee: Secondary | ICD-10-CM | POA: Diagnosis not present

## 2018-04-26 DIAGNOSIS — M1711 Unilateral primary osteoarthritis, right knee: Secondary | ICD-10-CM | POA: Diagnosis not present

## 2018-05-10 ENCOUNTER — Ambulatory Visit (INDEPENDENT_AMBULATORY_CARE_PROVIDER_SITE_OTHER): Payer: Medicare Other | Admitting: Podiatry

## 2018-05-10 ENCOUNTER — Encounter: Payer: Self-pay | Admitting: Podiatry

## 2018-05-10 DIAGNOSIS — L600 Ingrowing nail: Secondary | ICD-10-CM | POA: Diagnosis not present

## 2018-05-10 DIAGNOSIS — L608 Other nail disorders: Secondary | ICD-10-CM | POA: Diagnosis not present

## 2018-05-10 DIAGNOSIS — M79675 Pain in left toe(s): Secondary | ICD-10-CM | POA: Diagnosis not present

## 2018-05-10 DIAGNOSIS — B351 Tinea unguium: Secondary | ICD-10-CM | POA: Diagnosis not present

## 2018-05-10 DIAGNOSIS — M79674 Pain in right toe(s): Secondary | ICD-10-CM | POA: Diagnosis not present

## 2018-05-10 NOTE — Progress Notes (Signed)
Complaint:  Visit Type: Patient returns to my office for continued preventative foot care services. Complaint: Patient states" my nails have grown long and thick and become painful to walk and wear shoes"  The patient presents for preventative foot care services. No changes to ROS  Podiatric Exam: Vascular: dorsalis pedis and posterior tibial pulses are palpable bilateral. Capillary return is immediate. Temperature gradient is WNL. Skin turgor WNL  Sensorium: Normal Semmes Weinstein monofilament test. Normal tactile sensation bilaterally. Nail Exam: Pt has thick disfigured discolored nails with subungual debris noted bilateral entire nail halluxes. Ulcer Exam: There is no evidence of ulcer or pre-ulcerative changes or infection. Orthopedic Exam: Muscle tone and strength are WNL. No limitations in general ROM. No crepitus or effusions noted. Foot type and digits show no abnormalities. Bony prominences are unremarkable. Skin: No Porokeratosis. No infection or ulcers  Diagnosis:  Onychomycosis, , Pain in right toe, pain in left toes  Treatment & Plan Procedures and Treatment: Consent by patient was obtained for treatment procedures.   Debridement of mycotic and hypertrophic toenails, 1 through 5 bilateral and clearing of subungual debris. No ulceration, no infection noted. To continue to monitor and possibly schedule nail surgery for permanent correction ingrown toenails both great toes. Return Visit-Office Procedure: Patient instructed to return to the office for a follow up visit 10 weeks  for continued evaluation and treatment.    Gable Odonohue DPM 

## 2018-06-07 DIAGNOSIS — M25561 Pain in right knee: Secondary | ICD-10-CM | POA: Diagnosis not present

## 2018-06-14 ENCOUNTER — Encounter: Payer: Self-pay | Admitting: Podiatry

## 2018-06-14 ENCOUNTER — Ambulatory Visit (INDEPENDENT_AMBULATORY_CARE_PROVIDER_SITE_OTHER): Payer: Medicare Other | Admitting: Podiatry

## 2018-06-14 DIAGNOSIS — L608 Other nail disorders: Secondary | ICD-10-CM

## 2018-06-14 DIAGNOSIS — L600 Ingrowing nail: Secondary | ICD-10-CM

## 2018-06-14 DIAGNOSIS — L03032 Cellulitis of left toe: Secondary | ICD-10-CM

## 2018-06-14 NOTE — Progress Notes (Signed)
This patient  presents the office with chief complaint of a painful ingrown toenail on the outside border big toe left foot.  He states that his toe is painful walking and wearing his shoes.  He says he has been in pain for approximately 5 days.  He presented to this office last month for preventative foot care services, but was instructed to return to the office if the ingrowing nail persists.  We have previously discussed nail surgery for the permanent correction of this problem.  Vascular  Dorsalis pedis and posterior tibial pulses are palpable  B/L.  Capillary return  WNL.  Temperature gradient is  WNL.  Skin turgor  WNL  Sensorium  Senn Weinstein monofilament wire  WNL. Normal tactile sensation.  Nail Exam  Patient has thick disfigured discolored nails with marked incurvation along the lateral border left great toe.  Patient has redness, swelling and desquamation noted at the distal lateral border.  Orthopedic  Exam  Muscle tone and muscle strength  WNL.  No limitations of motion feet  B/L.  No crepitus or joint effusion noted.  Foot type is unremarkable and digits show no abnormalities.  Bony prominences are unremarkable.  Skin  No open lesions.  Normal skin texture and turgor.  Paronychia lateral border left great toe.  Pincer nail left hallux.  ROV.  Nail surgery.  Treatment options and alternatives discussed.  Recommended permanent phenol matrixectomy and patient agreed.  Left hallux  was prepped with alcohol and a toe block of 3cc of 2% lidocaine plain was administered in a digital toe block. .  The toe was then prepped with betadine solution . A tourniquet was applied to toe. The offending nail border was then excised and matrix tissue exposed.  Phenol was then applied to the matrix tissue followed by an alcohol wash.  Antibiotic ointment and a dry sterile dressing was applied.  The patient was dispensed instructions for aftercare.  RTC 10 days.     Gardiner Barefoot DPM

## 2018-06-25 ENCOUNTER — Encounter: Payer: Self-pay | Admitting: Podiatry

## 2018-06-25 ENCOUNTER — Ambulatory Visit (INDEPENDENT_AMBULATORY_CARE_PROVIDER_SITE_OTHER): Payer: Medicare Other | Admitting: Podiatry

## 2018-06-25 DIAGNOSIS — Z09 Encounter for follow-up examination after completed treatment for conditions other than malignant neoplasm: Secondary | ICD-10-CM

## 2018-06-25 NOTE — Progress Notes (Signed)
This patient returns to the office following nail surgery one week ago.  The patient says toe has been soaked and bandaged as directed.  There has been improvement of the toe since the surgery has been performed. The patient presents for continued evaluation and treatment.  GENERAL APPEARANCE: Alert, conversant. Appropriately groomed. No acute distress.  VASCULAR: Pedal pulses palpable at  DP and PT bilateral.  Capillary refill time is immediate to all digits,  Normal temperature gradient.    NEUROLOGIC: sensation is normal to 5.07 monofilament at 5/5 sites bilateral.  Light touch is intact bilateral, Muscle strength normal.  MUSCULOSKELETAL: acceptable muscle strength, tone and stability bilateral.  Intrinsic muscluature intact bilateral.  Rectus appearance of foot and digits noted bilateral.   DERMATOLOGIC: skin color, texture, and turgor are within normal limits.  No preulcerative lesions or ulcers  are seen, no interdigital maceration noted.   NAILS  There is necrotic tissue along the nail groove  In the absence of redness swelling and pain.  DX  S/p nail surgery  ROV  Home instructions were discussed.  Patient to call the office if there are any questions or concerns.   Prophet Renwick DPM   

## 2018-06-30 DIAGNOSIS — Z23 Encounter for immunization: Secondary | ICD-10-CM | POA: Diagnosis not present

## 2018-08-06 ENCOUNTER — Ambulatory Visit (INDEPENDENT_AMBULATORY_CARE_PROVIDER_SITE_OTHER): Payer: Medicare Other | Admitting: Cardiovascular Disease

## 2018-08-06 ENCOUNTER — Encounter: Payer: Self-pay | Admitting: Cardiovascular Disease

## 2018-08-06 VITALS — BP 124/72 | HR 84 | Ht 68.0 in | Wt 197.2 lb

## 2018-08-06 DIAGNOSIS — Z5181 Encounter for therapeutic drug level monitoring: Secondary | ICD-10-CM | POA: Diagnosis not present

## 2018-08-06 DIAGNOSIS — I10 Essential (primary) hypertension: Secondary | ICD-10-CM

## 2018-08-06 DIAGNOSIS — N183 Chronic kidney disease, stage 3 unspecified: Secondary | ICD-10-CM

## 2018-08-06 DIAGNOSIS — E7800 Pure hypercholesterolemia, unspecified: Secondary | ICD-10-CM

## 2018-08-06 MED ORDER — SPIRONOLACTONE 25 MG PO TABS: 12.5000 mg | ORAL_TABLET | Freq: Every day | ORAL | 3 refills | Status: DC

## 2018-08-06 NOTE — Addendum Note (Signed)
Addended by: Alvina Filbert B on: 08/06/2018 04:48 PM   Modules accepted: Orders

## 2018-08-06 NOTE — Progress Notes (Signed)
Cardiology Office Note   Date:  08/06/2018   ID:  Frank Johnston, DOB [birth year], MRN [id number]  PCP:  Leanna Battles, MD  Cardiologist:   Skeet Latch, MD   No chief complaint on file.   Patient ID: Frank Johnston is a 77 y.o. male with hypertension and prior pulmonary embolism who presents for follow up.  Frank Johnston was initially seen 09/2015 for dyspnea on exertion.  He initially saw his PCP, Dr. [name] 08/2015. He reported dyspnea with minimal exertion. He was noted to have trace edema on exam.  He was referred for CXR that showed mild hyperexpansion but no active disease.  BNP was 17. D-dimer was elevated at 1.6.  He was referred for CT angiography of the chest that was negative for pulmonary embolus The prior left lower lobe pulmonary embolism had resolved and his pulmonary arteries were normal in size.  No coronary calcifications were noted.  He was referred for [facility] 11/2015 that revealed a mild fixed defect in the apical and inferoseptal regions. There was no ischemia identified he had a normal ejection fraction.  He also had an echo that revealed an EF of 55-60% without wall motion abnormalities.  He was noted to have grade 1 diastolic dysfunction and mild mitral regurgitation.  Frank Johnston was referred to pulmonology where he underwent CPX testing. Ultimately his dyspnea was thought to be due to diastolic dysfunction, obesity, and deconditioning.  At his last appointment verapamil was switched to amlodipine due to poorly controlled blood pressure.  He followed up with our pharmacist 03/2018 and his blood pressure was well have been controlled.  He had LE edema on amlodipine so it was switched to spironolactone.  Since that time his BP has been well-controlled and he feels well.  He has no more edema.  He doesn't get much formal exercise due to knee pain.  He does yard work and walks some.  Frank Johnston denies exertional chest pain or shortness of  breath.  He has no orthopnea or PND.  His mother in law recently had two strokes so he and his wife have been busy caring for her.   Past Medical History:  Diagnosis Date  . Arthritis   . Chest pain 10/02/2015  . Chronic kidney disease    hx kidney stones  . GERD (gastroesophageal reflux disease)   . H/O hiatal hernia   . Hypertension   . Wears glasses     Past Surgical History:  Procedure Laterality Date  . [place] VITRECTOMY WITH 20 GAUGE MVR PORT Right 09/06/2016   Procedure: 25 GAUGE PARS PLANA VITRECTOMY WITH 20 GAUGE MVR PORT;  Surgeon: [name], MD;  Location: [place];  Service: Ophthalmology;  Laterality: Right;  . APPENDECTOMY  1976  . BACK SURGERY  1984   lumbar lam  . CARPAL TUNNEL RELEASE  12/06/2011   Procedure: CARPAL TUNNEL RELEASE;  Surgeon: Wynonia Sours, MD;  Location: [place];  Service: Orthopedics;  Laterality: Right;  . CARPAL TUNNEL RELEASE  08/07/2012   Procedure: CARPAL TUNNEL RELEASE;  Surgeon: Wynonia Sours, MD;  Location: [place];  Service: Orthopedics;  Laterality: Left;  carpal tunnel release  . CHOLECYSTECTOMY  1976  . COLONOSCOPY    . JOINT REPLACEMENT  4/12   lt total knee  . KNEE ARTHROSCOPY     rt and lt  . KNEE CLOSED REDUCTION  6/12   lt -after lt  total knee  . TRIGGER FINGER RELEASE  12/06/2011   Procedure: RELEASE TRIGGER FINGER/A-1 PULLEY;  Surgeon: Wynonia Sours, MD;  Location: [place];  Service: Orthopedics;  Laterality: Right;  right thumb, middle, and ring fingers  . TRIGGER FINGER RELEASE  08/07/2012   Procedure: RELEASE TRIGGER FINGER/A-1 PULLEY;  Surgeon: Wynonia Sours, MD;  Location: [place];  Service: Orthopedics;  Laterality: Left;  trigger release left ring finger     Current Outpatient Medications  Medication Sig Dispense Refill  . acetaminophen (TYLENOL) 500 MG tablet Take 1,000 mg by mouth daily as needed for moderate pain or headache.    Marland Kitchen  aspirin 81 MG tablet Take 81 mg by mouth every evening.     . fluticasone (FLONASE) 50 MCG/ACT nasal spray Place 2 sprays into the nose at bedtime as needed for allergies.     Marland Kitchen HYDROcodone-acetaminophen (NORCO) 5-325 MG per tablet Take 1 tablet by mouth every 6 (six) hours as needed for pain. 30 tablet 0  . LACTOBACILLUS PO Advanced Probiotic    . metoCLOPramide (REGLAN) 5 MG tablet Take 5 mg by mouth at bedtime.     Marland Kitchen omeprazole (PRILOSEC) 40 MG capsule Take 40 mg by mouth every evening.    . Probiotic Product (PROBIOTIC ADVANCED PO) Take 1 capsule by mouth at bedtime.     . sulindac (CLINORIL) 150 MG tablet Take 150 mg by mouth 2 (two) times daily as needed for pain.    Marland Kitchen therapeutic multivitamin-minerals (THERAGRAN-M) tablet Take 1 tablet by mouth daily.    . valsartan-hydrochlorothiazide (DIOVAN-HCT) 320-12.5 MG per tablet Take 1 tablet by mouth daily.    . vitamin E (VITAMIN E) 400 UNIT capsule Take 400 Units by mouth daily.    Marland Kitchen spironolactone (ALDACTONE) 25 MG tablet Take 0.5 tablets (12.5 mg total) by mouth daily. 45 tablet 3   No current facility-administered medications for this visit.     Allergies:   No known allergies    Social History:  The patient  reports that he quit smoking about 23 years ago. His smoking use included cigarettes. He has a 40.00 pack-year smoking history. He has never used smokeless tobacco. He reports that he does not drink alcohol or use drugs.   Family History:  The patient's family history includes Cancer in his father; Hypertension in his child and child; Stroke in his mother.    ROS:  Please see the history of present illness.   Otherwise, review of systems are positive for none.   All other systems are reviewed and negative.    PHYSICAL EXAM:  EKG:  EKG is ordered today. 01/30/18: Sinus rhythm.  Rate 69 bpm.  First degree AV block.  08/06/18: Sinus rhythm.  Rate 84 bpm.  LAFB.    Recent Labs: 03/14/2018: BUN 29; Creatinine, Ser 1.56; Potassium  4.3; Sodium 138   08/15/15: BNP 17.2  D-dimer 1.6 (normal 0-0.49) WBC 10.2, hemoglobin 16.3, hematocrit 46.3, platelets 225  sodium 140, Potassium 3.8, BUN 20, creatinine 1.1, glucose 112  Echo 11/16/15: Study Conclusions  - Left ventricle: The cavity size was normal. Wall thickness was   increased in a pattern of mild LVH. Systolic function was normal.   The estimated ejection fraction was in the range of 55% to 60%.   Wall motion was normal; there were no regional wall motion   abnormalities. Doppler parameters are consistent with abnormal   left ventricular relaxation (grade 1 diastolic dysfunction). - Mitral valve:  There was mild regurgitation.   Lipid Panel No results found for: CHOL, TRIG, HDL, CHOLHDL, VLDL, LDLCALC, LDLDIRECT   12/15/2017: Total cholesterol 170, LDL, HDL 59 BUN 24, creatinine 1.3 AST 19, ALT 21   Wt Readings from Last 3 Encounters:  08/06/18 197 lb 3.2 oz (89.4 kg)  01/30/18 202 lb 3.2 oz (91.7 kg)  12/30/16 210 lb (95.3 kg)      ASSESSMENT AND PLAN:  # Hypertension: Blood pressure is well-controlled.  However his renal function has been worse.  This could be related to valsartan, HCTZ, spironolactone, or Sulindac.  We will repeat his BMP and reassess.   # CV Disease prevention: Lipids followed by Dr. Philip Aspen.    Current medicines are reviewed at length with the patient today.  The patient does not have concerns regarding medicines.  The following changes have been made:  no change  Labs/ tests ordered today include:   Orders Placed This Encounter  Procedures  . Basic metabolic panel     Disposition:   FU with Merle Cirelli C. Oval Linsey, MD, [facility] in 1 year.   Signed, Jaynie Hitch C. Oval Linsey, MD, [facility]  08/06/2018 3:16 PM    [place]

## 2018-08-06 NOTE — Patient Instructions (Signed)
Medication Instructions:  Your physician recommends that you continue on your current medications as directed. Please refer to the Current Medication list given to you today.  If you need a refill on your cardiac medications before your next appointment, please call your pharmacy.   Lab work: BMET TODAY   Testing/Procedures: NONE  Follow-Up: At [facility], you and your health needs are our priority.  As part of our continuing mission to provide you with exceptional heart care, we have created designated Provider Care Teams.  These Care Teams include your primary Cardiologist (physician) and Advanced Practice Providers (APPs -  Physician Assistants and Nurse Practitioners) who all work together to provide you with the care you need, when you need it. You will need a follow up appointment in 12 months.  Please call our office 2 months in advance to schedule this appointment.  You may see DR [facility] or one of the following Advanced Practice Providers on your designated Care Team:   Kerin Ransom, PA-C Roby Lofts, [place] . Sande Rives, PA-C

## 2018-08-07 LAB — BASIC METABOLIC PANEL
BUN / CREAT RATIO: 19 (ref 10–24)
BUN: 31 mg/dL — AB (ref 8–27)
CALCIUM: 10.2 mg/dL (ref 8.6–10.2)
CHLORIDE: 102 mmol/L (ref 96–106)
CO2: 20 mmol/L (ref 20–29)
Creatinine, Ser: 1.61 mg/dL — ABNORMAL HIGH (ref 0.76–1.27)
GFR calc Af Amer: 47 mL/min/{1.73_m2} — ABNORMAL LOW (ref >59)
GFR calc non Af Amer: 40 mL/min/{1.73_m2} — ABNORMAL LOW (ref >59)
Glucose: 113 mg/dL — ABNORMAL HIGH (ref 65–99)
Potassium: 4.4 mmol/L (ref 3.5–5.2)
Sodium: 141 mmol/L (ref 134–144)

## 2018-08-09 ENCOUNTER — Ambulatory Visit (INDEPENDENT_AMBULATORY_CARE_PROVIDER_SITE_OTHER): Payer: Self-pay | Admitting: Podiatry

## 2018-08-09 ENCOUNTER — Encounter: Payer: Self-pay | Admitting: Podiatry

## 2018-08-09 DIAGNOSIS — L608 Other nail disorders: Secondary | ICD-10-CM

## 2018-08-09 DIAGNOSIS — M79675 Pain in left toe(s): Secondary | ICD-10-CM

## 2018-08-09 DIAGNOSIS — M79674 Pain in right toe(s): Secondary | ICD-10-CM

## 2018-08-09 DIAGNOSIS — B351 Tinea unguium: Secondary | ICD-10-CM

## 2018-08-09 NOTE — Progress Notes (Signed)
Complaint:  Visit Type: Patient returns to my office for continued preventative foot care services. Complaint: Patient states" my nails have grown long and thick and become painful to walk and wear shoes"  The patient presents for preventative foot care services. No changes to ROS  Podiatric Exam: Vascular: dorsalis pedis and posterior tibial pulses are palpable bilateral. Capillary return is immediate. Temperature gradient is WNL. Skin turgor WNL  Sensorium: Normal Semmes Weinstein monofilament test. Normal tactile sensation bilaterally. Nail Exam: Pt has thick disfigured discolored nails with subungual debris noted bilateral entire nail halluxes. Ulcer Exam: There is no evidence of ulcer or pre-ulcerative changes or infection. Orthopedic Exam: Muscle tone and strength are WNL. No limitations in general ROM. No crepitus or effusions noted. Foot type and digits show no abnormalities. Bony prominences are unremarkable. Skin: No Porokeratosis. No infection or ulcers  Diagnosis:  Onychomycosis, , Pain in right toe, pain in left toes  Treatment & Plan Procedures and Treatment: Consent by patient was obtained for treatment procedures.   Debridement of mycotic and hypertrophic toenails, 1 through 5 bilateral and clearing of subungual debris. No ulceration, no infection noted. To continue to monitor and possibly schedule nail surgery for permanent correction ingrown toenails both great toes. Return Visit-Office Procedure: Patient instructed to return to the office for a follow up visit 12 weeks  for continued evaluation and treatment.    Gardiner Barefoot DPM

## 2018-08-13 ENCOUNTER — Telehealth: Payer: Self-pay | Admitting: *Deleted

## 2018-08-13 NOTE — Telephone Encounter (Signed)
Left message to call back  

## 2018-08-13 NOTE — Telephone Encounter (Signed)
-----   Message from Skeet Latch, MD sent at 08/13/2018  7:32 AM EST ----- Kidney function continues to be elevated.  Recommend stopping HCTZ and spironolactone.  Continue valsartan.  Start amlodipine 5mg  daily.  Track BP at home and bring for follow up with PharmD or me in 1 month.  Repeat BMP prior to that appointment.

## 2018-08-14 MED ORDER — AMLODIPINE BESYLATE 5 MG PO TABS: 5.0000 mg | ORAL_TABLET | Freq: Every day | ORAL | 5 refills | Status: DC

## 2018-08-14 MED ORDER — VALSARTAN 320 MG PO TABS: 320.0000 mg | ORAL_TABLET | Freq: Every day | ORAL | 5 refills | Status: DC

## 2018-08-14 NOTE — Telephone Encounter (Signed)
Advised patient of lab results and medication changes, verbalized understanding. Scheduled follow up with Pharm D

## 2018-08-20 ENCOUNTER — Telehealth: Payer: Self-pay | Admitting: Cardiovascular Disease

## 2018-08-20 DIAGNOSIS — D692 Other nonthrombocytopenic purpura: Secondary | ICD-10-CM | POA: Diagnosis not present

## 2018-08-20 DIAGNOSIS — L821 Other seborrheic keratosis: Secondary | ICD-10-CM | POA: Diagnosis not present

## 2018-08-20 DIAGNOSIS — Z85828 Personal history of other malignant neoplasm of skin: Secondary | ICD-10-CM | POA: Diagnosis not present

## 2018-08-20 DIAGNOSIS — I10 Essential (primary) hypertension: Secondary | ICD-10-CM

## 2018-08-20 DIAGNOSIS — L812 Freckles: Secondary | ICD-10-CM | POA: Diagnosis not present

## 2018-08-20 DIAGNOSIS — D1801 Hemangioma of skin and subcutaneous tissue: Secondary | ICD-10-CM | POA: Diagnosis not present

## 2018-08-20 MED ORDER — DOXAZOSIN MESYLATE 4 MG PO TABS: 4.0000 mg | ORAL_TABLET | Freq: Every day | ORAL | 1 refills | Status: DC

## 2018-08-20 NOTE — Telephone Encounter (Signed)
New Message         Pt c/o swelling: STAT is pt has developed SOB within 24 hours  1) How much weight have you gained and in what time span? 2-3 pounds  2) If swelling, where is the swelling located? Ankles  3) Are you currently taking a fluid pill? No  4) Are you currently SOB? No  5) Do you have a log of your daily weights (if so, list)? No  6) Have you gained 3 pounds in a day or 5 pounds in a week? No  7) Have you traveled recently? No             Patient states he is not going to take any more medication until he hears from the doc

## 2018-08-20 NOTE — Telephone Encounter (Signed)
Spoke with pt. Pt sts that his Hctz and Spironolactone was d/c by Dr.Kensington on 11/11 due to elevated creatinine. He was started on Amlodipine 5mg  daily which he takes at night. Pt sts that he has been having increased swelling in both ankles, pt denies any other symptoms. Pt sts that he has taken Amlodipine in the past and it was stopped for the same reason.  Pt wants to know if can be prescribed something else. Pt is scheduled to see our Pharmacist in the BP clinic on 12/9. Adv pt that Dr.Randolp is rounding in the hospital today, I will fwd the message to our Pharm-D and call back with their recommendation. Pt agreeable with plan and rqst a detailed message be left on his home voicemail if he isn't at home.

## 2018-08-20 NOTE — Telephone Encounter (Addendum)
Called to give pt Frank Johnston Pharm-D recommendation. Spoke with pt wife DPR on file.  Pt can d/c Amlodipine. Pt is to start doxazosin 4mg  at daily bedtime. Rx sent to pt pharmacy. Pt should stay hydrated.Pt will come to the office 1-2 days prior to his Pharm-D appt on 12/9 for a bmet (order in [place]). Adv pt wife to have the pt monitor his BP regularly and bring the readings with him to his upcoming appt. Pt was out at another doctors appt, adv pt wife to have the pt contact the office if any questions or concerns.

## 2018-08-20 NOTE — Telephone Encounter (Signed)
Okay to stop amlodipine, start doxazosin 4mg  qHS, stay hydrated and repeat BMET 1-2 days prior pharmacist visit.

## 2018-08-22 ENCOUNTER — Telehealth: Payer: Self-pay | Admitting: *Deleted

## 2018-08-22 MED ORDER — DOXAZOSIN MESYLATE 4 MG PO TABS: 4.0000 mg | ORAL_TABLET | Freq: Every day | ORAL | 1 refills | Status: DC

## 2018-08-22 NOTE — Telephone Encounter (Signed)
Received call from a patient stating that he never got the doxazosin sent to [facility] secondary to a problem with Rx. Called [facility] and they were just asking for 90 day supply. Rx called in patient aware

## 2018-09-01 ENCOUNTER — Other Ambulatory Visit: Payer: Self-pay | Admitting: Cardiovascular Disease

## 2018-09-06 ENCOUNTER — Other Ambulatory Visit: Payer: Self-pay

## 2018-09-06 DIAGNOSIS — I10 Essential (primary) hypertension: Secondary | ICD-10-CM | POA: Diagnosis not present

## 2018-09-06 LAB — BASIC METABOLIC PANEL
BUN/Creatinine Ratio: 20 (ref 10–24)
BUN: 24 mg/dL (ref 8–27)
CALCIUM: 9.8 mg/dL (ref 8.6–10.2)
CHLORIDE: 103 mmol/L (ref 96–106)
CO2: 23 mmol/L (ref 20–29)
Creatinine, Ser: 1.23 mg/dL (ref 0.76–1.27)
GFR calc Af Amer: 65 mL/min/{1.73_m2} (ref >59)
GFR calc non Af Amer: 56 mL/min/{1.73_m2} — ABNORMAL LOW (ref >59)
GLUCOSE: 80 mg/dL (ref 65–99)
POTASSIUM: 4.8 mmol/L (ref 3.5–5.2)
Sodium: 141 mmol/L (ref 134–144)

## 2018-09-10 ENCOUNTER — Ambulatory Visit (INDEPENDENT_AMBULATORY_CARE_PROVIDER_SITE_OTHER): Payer: Medicare Other | Admitting: Pharmacist Clinician (PhC)/ Clinical Pharmacy Specialist

## 2018-09-10 VITALS — BP 172/90 | HR 76 | Ht 68.0 in

## 2018-09-10 DIAGNOSIS — I10 Essential (primary) hypertension: Secondary | ICD-10-CM

## 2018-09-10 MED ORDER — HYDRALAZINE HCL 25 MG PO TABS: 25.0000 mg | ORAL_TABLET | Freq: Two times a day (BID) | ORAL | 3 refills | Status: DC

## 2018-09-10 NOTE — Progress Notes (Signed)
09/10/2018 Frank Johnston 09-May-1941 [id number]   HPI:  Frank Johnston is a 77 y.o. male patient of Dr Oval Linsey, with a PMH below who presents today for hypertension clinic follow up  His medical history is significant only for prior PE, dyspepsia and seasonal allergies.   We saw him in [facility] about 6 months ago and he was well controlled on valsartan hctz 320/12.5 and spironolactone 12.5 mg.  Unfortunately he had a rise in SCr and the hctz and spironolactone were both discontinued.  He was then put on amlodipine 5 mg daily along with the valsartan.   He had been on amlodipine in the past, which caused lower extremity edema, and unfortunately this happened again.  The amlodipine was again stopped and he was given doxazosin 2 mg.  Within a couple of weeks the dose was increased to 4 mg, as his blood pressure remained elevated.    Today he comes in feeling somewhat "out of sorts".  He is still having swelling in his legs and has been feeling overall "blah".    At his last visit with Dr. Oval Linsey his verapamil 240 mg was switched to amlodipine 10 mg for better BP control.  Unfortunately this caused him to have lower extremity edema, so at his last visit in [facility] it was discontinued and he was started on spironolactone.  He had labs drawn after starting this and there was no significant change in his serum creatinine or potassium.  He returns today for follow up.   Stopped spironolactone, hctz and amlodipine all within the past month.  Ankle swelling has continued.    Arthritis in knees - takes sulindac qd. Occasional hydrocodone   Blood Pressure Goal:  130/80  Current Medications:  Valsartan 320 mg qd  Doxazosin 4 mg qhs   Family Hx:  Mother with hypertension - died at 4 probable stroke  Father died at 42 from asbestos related lung cancer  Brother with hypertension  Daughter with hypertension  Social Hx:  No tobacco, quit in 1996; no alcohol; 2 coffee at most in the mornings;  occasional sweet tea or soda  Diet:  Mostly home cooked meals, only adds salt to a few items; veggies are mostly fresh in spring/summer. Some canned in winter;  Plenty of chicken, some beef and pork, rare fish; eats some cracker, chips  Exercise:  Mows yard regularly, stays busy around house; walking more because MIL in nursing home  Home BP readings:  Home cuff several years old,  Read within 5 points of the office meter;  arm cuff, took 21 morning readings (average 132/78) and 21 evening readings (average 125/74)    158/87  Intolerances  Knows took med several years ago that caused cough -  assume ACEI  Amlodipine - edema  SCr improved after stopping spironolactone and hctz    CrCl cannot be calculated (Unknown ideal weight.).  Wt Readings from Last 3 Encounters:  08/06/18 197 lb 3.2 oz (89.4 kg)  01/30/18 202 lb 3.2 oz (91.7 kg)  12/30/16 210 lb (95.3 kg)   BP Readings from Last 3 Encounters:  09/10/18 (!) 172/90  08/06/18 124/72  03/22/18 128/76   Pulse Readings from Last 3 Encounters:  09/10/18 76  08/06/18 84  03/22/18 86    Current Outpatient Medications  Medication Sig Dispense Refill  . acetaminophen (TYLENOL) 500 MG tablet Take 1,000 mg by mouth daily as needed for moderate pain or headache.    Marland Kitchen aspirin 81 MG tablet  Take 81 mg by mouth every evening.     Marland Kitchen doxazosin (CARDURA) 4 MG tablet Take 1 tablet (4 mg total) by mouth at bedtime. 90 tablet 1  . fluticasone (FLONASE) 50 MCG/ACT nasal spray Place 2 sprays into the nose at bedtime as needed for allergies.     . hydrALAZINE (APRESOLINE) 25 MG tablet Take 1 tablet (25 mg total) by mouth 2 (two) times daily. 60 tablet 3  . HYDROcodone-acetaminophen (NORCO) 5-325 MG per tablet Take 1 tablet by mouth every 6 (six) hours as needed for pain. 30 tablet 0  . LACTOBACILLUS PO Advanced Probiotic    . metoCLOPramide (REGLAN) 5 MG tablet Take 5 mg by mouth at bedtime.     Marland Kitchen omeprazole (PRILOSEC) 40 MG capsule Take 40 mg  by mouth every evening.    . Probiotic Product (PROBIOTIC ADVANCED PO) Take 1 capsule by mouth at bedtime.     . sulindac (CLINORIL) 150 MG tablet Take 150 mg by mouth 2 (two) times daily as needed for pain.    Marland Kitchen therapeutic multivitamin-minerals (THERAGRAN-M) tablet Take 1 tablet by mouth daily.    . valsartan (DIOVAN) 320 MG tablet Take 1 tablet (320 mg total) by mouth daily. 30 tablet 5  . vitamin E (VITAMIN E) 400 UNIT capsule Take 400 Units by mouth daily.     No current facility-administered medications for this visit.     Allergies  Allergen Reactions  . No Known Allergies     Past Medical History:  Diagnosis Date  . Arthritis   . Chest pain 10/02/2015  . Chronic kidney disease    hx kidney stones  . GERD (gastroesophageal reflux disease)   . H/O hiatal hernia   . Hypertension   . Wears glasses     Blood pressure (!) 172/90, pulse 76, height 5\' 8"  (1.727 m).  Essential hypertension Patient with poorly controlled blood pressure and not liking side effects of his current regimen.  In reviewing all of his medications, we are going to stop the sulindac and switch him to prn naproxen sodium.  He will taper off the sulindac to prevent any arthritis flares, as he has been on this for some time. I suspect the swelling may be in part to stopping both the hctz and spironolactone.  Will restart just the hctz, as the patient still has several months of valsartan/hctz 320/12.5 at home.  He is to get a BMET in 10 days and is aware that we may not be able to continue if his SCr increases again.  Will also add hydralazine 25 mg twice daily.  He is to return for follow up in 3 weeks   Tommy Medal PharmD CPP [place] [address] [place], [place] [address] [phone]

## 2018-09-10 NOTE — Patient Instructions (Signed)
Return for a a follow up appointment on Dec 31  Go to the lab in 10 days to check kidney function.   Your blood pressure today is 172/90  Check your blood pressure at home daily (if able) and keep record of the readings.  Take your BP meds as follows:  Decrease dose to 1 tablet daily for a week, then one tablet every other day for another 7-10 days.  After that use Aleve 220 mg twice daily as needed for arthritis pain  Continue the doxazosin until Thursday night, then discontinue   AM:  Valsartan hctz 320/12.5, hydralazine 25 mg  PM:  Hydralazine 25 mg    Bring all of your meds, your BP cuff and your record of home blood pressures to your next appointment.  Exercise as you're able, try to walk approximately 30 minutes per day.  Keep salt intake to a minimum, especially watch canned and prepared boxed foods.  Eat more fresh fruits and vegetables and fewer canned items.  Avoid eating in fast food restaurants.    HOW TO TAKE YOUR BLOOD PRESSURE: . Rest 5 minutes before taking your blood pressure. .  Don't smoke or drink caffeinated beverages for at least 30 minutes before. . Take your blood pressure before (not after) you eat. . Sit comfortably with your back supported and both feet on the floor (don't cross your legs). . Elevate your arm to heart level on a table or a desk. . Use the proper sized cuff. It should fit smoothly and snugly around your bare upper arm. There should be enough room to slip a fingertip under the cuff. The bottom edge of the cuff should be 1 inch above the crease of the elbow. . Ideally, take 3 measurements at one sitting and record the average.

## 2018-09-10 NOTE — Assessment & Plan Note (Signed)
Patient with poorly controlled blood pressure and not liking side effects of his current regimen.  In reviewing all of his medications, we are going to stop the sulindac and switch him to prn naproxen sodium.  He will taper off the sulindac to prevent any arthritis flares, as he has been on this for some time. I suspect the swelling may be in part to stopping both the hctz and spironolactone.  Will restart just the hctz, as the patient still has several months of valsartan/hctz 320/12.5 at home.  He is to get a BMET in 10 days and is aware that we may not be able to continue if his SCr increases again.  Will also add hydralazine 25 mg twice daily.  He is to return for follow up in 3 weeks

## 2018-09-13 ENCOUNTER — Ambulatory Visit: Payer: Medicare Other

## 2018-09-20 DIAGNOSIS — I10 Essential (primary) hypertension: Secondary | ICD-10-CM | POA: Diagnosis not present

## 2018-09-20 LAB — BASIC METABOLIC PANEL
BUN/Creatinine Ratio: 22 (ref 10–24)
BUN: 30 mg/dL — ABNORMAL HIGH (ref 8–27)
CALCIUM: 10.1 mg/dL (ref 8.6–10.2)
CO2: 24 mmol/L (ref 20–29)
Chloride: 99 mmol/L (ref 96–106)
Creatinine, Ser: 1.36 mg/dL — ABNORMAL HIGH (ref 0.76–1.27)
GFR calc non Af Amer: 49 mL/min/{1.73_m2} — ABNORMAL LOW (ref >59)
GFR, EST AFRICAN AMERICAN: 57 mL/min/{1.73_m2} — AB (ref >59)
Glucose: 94 mg/dL (ref 65–99)
Potassium: 4.8 mmol/L (ref 3.5–5.2)
Sodium: 139 mmol/L (ref 134–144)

## 2018-10-02 ENCOUNTER — Ambulatory Visit (INDEPENDENT_AMBULATORY_CARE_PROVIDER_SITE_OTHER): Payer: Medicare Other | Admitting: Pharmacist Clinician (PhC)/ Clinical Pharmacy Specialist

## 2018-10-02 DIAGNOSIS — I10 Essential (primary) hypertension: Secondary | ICD-10-CM

## 2018-10-02 MED ORDER — HYDRALAZINE HCL 50 MG PO TABS: 50.0000 mg | ORAL_TABLET | Freq: Three times a day (TID) | ORAL | 3 refills | Status: DC

## 2018-10-02 MED ORDER — VALSARTAN-HYDROCHLOROTHIAZIDE 320-12.5 MG PO TABS: 1.0000 | ORAL_TABLET | Freq: Every day | ORAL | 3 refills | Status: DC

## 2018-10-02 NOTE — Patient Instructions (Addendum)
Return for a a follow up appointment in February  Your blood pressure today is  136/78 (goal is < 130/80)  Check your blood pressure at home several times each week and keep record of the readings.  Take your BP meds as follows:  Increase the hydralazine to 50 mg twice daily - new prescription has been sent to [facility]  Continue with all other medications  Bring all of your meds, your BP cuff and your record of home blood pressures to your next appointment.  Exercise as you're able, try to walk approximately 30 minutes per day.  Keep salt intake to a minimum, especially watch canned and prepared boxed foods.  Eat more fresh fruits and vegetables and fewer canned items.  Avoid eating in fast food restaurants.    HOW TO TAKE YOUR BLOOD PRESSURE: . Rest 5 minutes before taking your blood pressure. .  Don't smoke or drink caffeinated beverages for at least 30 minutes before. . Take your blood pressure before (not after) you eat. . Sit comfortably with your back supported and both feet on the floor (don't cross your legs). . Elevate your arm to heart level on a table or a desk. . Use the proper sized cuff. It should fit smoothly and snugly around your bare upper arm. There should be enough room to slip a fingertip under the cuff. The bottom edge of the cuff should be 1 inch above the crease of the elbow. . Ideally, take 3 measurements at one sitting and record the average.

## 2018-10-02 NOTE — Assessment & Plan Note (Signed)
Patient with essential hypertension, still not to goal.  We discussed that some of his elevated pressure could be due to more eating out as well as the chronic knee pain.  Will increase hydralazine to 50 mg bid and continue all other medications.  He will continue monitoring home readings and hopefully will consider TKA in the late spring.

## 2018-10-02 NOTE — Progress Notes (Signed)
10/02/2018 Frank Johnston [birth year] [id number]   HPI:  Frank Johnston is a 77 y.o. male patient of Dr Oval Linsey, with a PMH below who presents today for hypertension clinic follow up  His medical history is significant only for prior PE, dyspepsia and seasonal allergies.   We saw him in [facility] earlier in 2019 and he was well controlled on valsartan hctz 320/12.5 and spironolactone 12.5 mg.  Unfortunately he had a rise in SCr and the hctz and spironolactone were both discontinued.  He was then put on amlodipine 5 mg daily along with the valsartan.   Unfortunately he had edema from the amlodipine and it was discontinued in favor of doxazosin 2 mg.  Even after increasing this to 4 mg, he continued to have elevated blood pressure readings.    At his last visit with me, about 3 weeks ago, he was still having some lower extremity edema and feeling "blah".  We reviewed his medications in detail and made several changes.  For his chronic arthritis, I suggested tapering off the sulindac and starting OTC naproxen prn, as anti-inflammatories can cause hypertension.  Suggested he take that for 2-3 days when he has an arthritis flare, but otherwise only prn.  Also I restarted him on hctz 12.5 mg to see if we could get his swelling down. He had plenty of valsartan hctz 320/12.5 mg tablets from a prior prescription, so he restarted those tablets.  He had a BMET after 10 days, which showed a minimal (10%) increase in SCr.  He was also started on hydralazine 25 mg bid.    Today he returns for follow up.  Edema gone now that added hctz back to valsartan.  No complaints of chest pain, shortness or breath or dizziness.   He does endorse regular pain in his right knee.  He is scheduled for a final injection in that knee in February, then will consider TKA if no improvement.    Blood Pressure Goal:  130/80  Current Medications:  Valsartan/hctz  320/12.5 mg qd  Doxazosin 4 mg qhs  Hydralazine 25 mg  bid   Family Hx:  Mother with hypertension - died at 49 probable stroke  Father died at 25 from asbestos related lung cancer  Brother with hypertension  Daughter with hypertension  Social Hx:  No tobacco, quit in 1996; no alcohol; 2 coffee at most in the mornings; occasional sweet tea or soda  Diet:  Recently has been eating out more as he and wife visit her mother in nursing facility most days.  previously was mostly home cooked meals, only adds salt to a few items; veggies are mostly fresh in spring/summer. Some canned in winter;  Plenty of chicken, some beef and pork, rare fish; eats some cracker, chips  Exercise:  Mows yard regularly, stays busy around house; walking more because MIL in nursing home  Home BP readings:  Home cuff several years old, previously read within 5 points of the office meter;  In the last 2 weeks 12 morning readings averaged 139/75 and 11 evening readings averaged 139/67.  Intolerances  Knows took med several years ago that caused cough -  assume ACEI  Amlodipine - edema  SCr improved after stopping spironolactone and hctz   Labs:  09/20/18:  Na 139, K 4.8, Glu 94, BUN 30, SCr 1.36 GFR 49  09/06/18:   Na 141, K 4.8, Glu 80, BUN 24, SCr 1.23 GFR 56  08/06/18:   Na 141, K  4.4, Glu 113, BUN 31, SCr 1.61 GFR 40  Wt Readings from Last 3 Encounters:  08/06/18 197 lb 3.2 oz (89.4 kg)  01/30/18 202 lb 3.2 oz (91.7 kg)  12/30/16 210 lb (95.3 kg)   BP Readings from Last 3 Encounters:  10/02/18 136/78  09/10/18 (!) 172/90  08/06/18 124/72   Pulse Readings from Last 3 Encounters:  10/02/18 67  09/10/18 76  08/06/18 84    Current Outpatient Medications  Medication Sig Dispense Refill  . acetaminophen (TYLENOL) 500 MG tablet Take 1,000 mg by mouth daily as needed for moderate pain or headache.    Marland Kitchen aspirin 81 MG tablet Take 81 mg by mouth every evening.     . fluticasone (FLONASE) 50 MCG/ACT nasal spray Place 2 sprays into the nose at bedtime as needed for  allergies.     Marland Kitchen HYDROcodone-acetaminophen (NORCO) 5-325 MG per tablet Take 1 tablet by mouth every 6 (six) hours as needed for pain. 30 tablet 0  . LACTOBACILLUS PO Advanced Probiotic    . metoCLOPramide (REGLAN) 5 MG tablet Take 5 mg by mouth at bedtime.     Marland Kitchen omeprazole (PRILOSEC) 40 MG capsule Take 40 mg by mouth every evening.    . Probiotic Product (PROBIOTIC ADVANCED PO) Take 1 capsule by mouth at bedtime.     Marland Kitchen therapeutic multivitamin-minerals (THERAGRAN-M) tablet Take 1 tablet by mouth daily.    . valsartan (DIOVAN) 320 MG tablet Take 1 tablet (320 mg total) by mouth daily. 30 tablet 5  . vitamin E (VITAMIN E) 400 UNIT capsule Take 400 Units by mouth daily.    Marland Kitchen doxazosin (CARDURA) 4 MG tablet Take 1 tablet (4 mg total) by mouth at bedtime. (Patient not taking: Reported on 10/02/2018) 90 tablet 1  . hydrALAZINE (APRESOLINE) 50 MG tablet Take 1 tablet (50 mg total) by mouth 3 (three) times daily. 270 tablet 3  . sulindac (CLINORIL) 150 MG tablet Take 150 mg by mouth 2 (two) times daily as needed for pain.    . valsartan-hydrochlorothiazide (DIOVAN-HCT) 320-12.5 MG tablet Take 1 tablet by mouth daily. 90 tablet 3   No current facility-administered medications for this visit.     Allergies  Allergen Reactions  . No Known Allergies     Past Medical History:  Diagnosis Date  . Arthritis   . Chest pain 10/02/2015  . Chronic kidney disease    hx kidney stones  . GERD (gastroesophageal reflux disease)   . H/O hiatal hernia   . Hypertension   . Wears glasses     Blood pressure 136/78, pulse 67, resp. rate 15.  Essential hypertension Patient with essential hypertension, still not to goal.  We discussed that some of his elevated pressure could be due to more eating out as well as the chronic knee pain.  Will increase hydralazine to 50 mg bid and continue all other medications.  He will continue monitoring home readings and hopefully will consider TKA in the late spring.      Tommy Medal PharmD CPP [facility] [address] [place], [place] [address] [phone]

## 2018-10-05 DIAGNOSIS — H35033 Hypertensive retinopathy, bilateral: Secondary | ICD-10-CM | POA: Diagnosis not present

## 2018-10-05 DIAGNOSIS — H35371 Puckering of macula, right eye: Secondary | ICD-10-CM | POA: Diagnosis not present

## 2018-10-05 DIAGNOSIS — H40013 Open angle with borderline findings, low risk, bilateral: Secondary | ICD-10-CM | POA: Diagnosis not present

## 2018-10-05 DIAGNOSIS — Z961 Presence of intraocular lens: Secondary | ICD-10-CM | POA: Diagnosis not present

## 2018-10-11 DIAGNOSIS — Z1331 Encounter for screening for depression: Secondary | ICD-10-CM | POA: Diagnosis not present

## 2018-10-11 DIAGNOSIS — Z6830 Body mass index (BMI) 30.0-30.9, adult: Secondary | ICD-10-CM | POA: Diagnosis not present

## 2018-10-11 DIAGNOSIS — M5136 Other intervertebral disc degeneration, lumbar region: Secondary | ICD-10-CM | POA: Diagnosis not present

## 2018-10-11 DIAGNOSIS — I10 Essential (primary) hypertension: Secondary | ICD-10-CM | POA: Diagnosis not present

## 2018-10-11 DIAGNOSIS — N183 Chronic kidney disease, stage 3 (moderate): Secondary | ICD-10-CM | POA: Diagnosis not present

## 2018-10-11 DIAGNOSIS — Z1389 Encounter for screening for other disorder: Secondary | ICD-10-CM | POA: Diagnosis not present

## 2018-10-11 DIAGNOSIS — M25561 Pain in right knee: Secondary | ICD-10-CM | POA: Diagnosis not present

## 2018-10-15 ENCOUNTER — Other Ambulatory Visit: Payer: Self-pay | Admitting: Internal Medicine

## 2018-10-15 DIAGNOSIS — M545 Low back pain, unspecified: Secondary | ICD-10-CM

## 2018-10-15 DIAGNOSIS — G8929 Other chronic pain: Secondary | ICD-10-CM

## 2018-10-19 ENCOUNTER — Ambulatory Visit
Admission: RE | Admit: 2018-10-19 | Discharge: 2018-10-19 | Disposition: A | Discharge status: Home / Self Care | Payer: Medicare Other | Source: Ambulatory Visit | Attending: Internal Medicine | Admitting: Internal Medicine

## 2018-10-19 DIAGNOSIS — M48061 Spinal stenosis, lumbar region without neurogenic claudication: Secondary | ICD-10-CM | POA: Diagnosis not present

## 2018-10-19 DIAGNOSIS — G8929 Other chronic pain: Secondary | ICD-10-CM

## 2018-10-19 DIAGNOSIS — M5116 Intervertebral disc disorders with radiculopathy, lumbar region: Secondary | ICD-10-CM | POA: Diagnosis not present

## 2018-10-19 DIAGNOSIS — M545 Low back pain: Principal | ICD-10-CM

## 2018-10-19 MED ORDER — GADOBENATE DIMEGLUMINE 529 MG/ML IV SOLN
19.0000 mL | Freq: Once | INTRAVENOUS | Status: AC | PRN
  Administered 2018-10-19: 19 mL via INTRAVENOUS

## 2018-11-12 ENCOUNTER — Ambulatory Visit (INDEPENDENT_AMBULATORY_CARE_PROVIDER_SITE_OTHER): Payer: Medicare Other | Admitting: Podiatry

## 2018-11-12 ENCOUNTER — Encounter: Payer: Self-pay | Admitting: Podiatry

## 2018-11-12 DIAGNOSIS — L608 Other nail disorders: Secondary | ICD-10-CM

## 2018-11-12 DIAGNOSIS — B351 Tinea unguium: Secondary | ICD-10-CM | POA: Diagnosis not present

## 2018-11-12 DIAGNOSIS — M79674 Pain in right toe(s): Secondary | ICD-10-CM | POA: Diagnosis not present

## 2018-11-12 DIAGNOSIS — M79675 Pain in left toe(s): Secondary | ICD-10-CM | POA: Diagnosis not present

## 2018-11-12 NOTE — Progress Notes (Signed)
Complaint:  Visit Type: Patient returns to my office for continued preventative foot care services. Complaint: Patient states" my nails have grown long and thick and become painful to walk and wear shoes"  The patient presents for preventative foot care services. No changes to ROS  Podiatric Exam: Vascular: dorsalis pedis and posterior tibial pulses are palpable bilateral. Capillary return is immediate. Temperature gradient is WNL. Skin turgor WNL  Sensorium: Normal Semmes Weinstein monofilament test. Normal tactile sensation bilaterally. Nail Exam: Pt has thick disfigured discolored nails with subungual debris noted bilateral entire nail halluxes. Ulcer Exam: There is no evidence of ulcer or pre-ulcerative changes or infection. Orthopedic Exam: Muscle tone and strength are WNL. No limitations in general ROM. No crepitus or effusions noted. Foot type and digits show no abnormalities. Bony prominences are unremarkable. Skin: No Porokeratosis. No infection or ulcers  Diagnosis:  Onychomycosis, , Pain in right toe, pain in left toes  Treatment & Plan Procedures and Treatment: Consent by patient was obtained for treatment procedures.   Debridement of mycotic and hypertrophic toenails, 1 through 5 bilateral and clearing of subungual debris. No ulceration, no infection noted. To continue to monitor and possibly schedule nail surgery for permanent correction ingrown toenails both great toes. Return Visit-Office Procedure: Patient instructed to return to the office for a follow up visit 10 weeks  for continued evaluation and treatment.    Gardiner Barefoot DPM

## 2018-11-13 ENCOUNTER — Ambulatory Visit (INDEPENDENT_AMBULATORY_CARE_PROVIDER_SITE_OTHER): Payer: Self-pay

## 2018-11-13 ENCOUNTER — Ambulatory Visit (INDEPENDENT_AMBULATORY_CARE_PROVIDER_SITE_OTHER): Payer: Medicare Other | Admitting: Physical Medicine and Rehabilitation

## 2018-11-13 ENCOUNTER — Encounter (INDEPENDENT_AMBULATORY_CARE_PROVIDER_SITE_OTHER): Payer: Self-pay | Admitting: Physical Medicine and Rehabilitation

## 2018-11-13 VITALS — BP 148/90 | HR 78 | Temp 98.0°F | Ht 68.0 in | Wt 200.0 lb

## 2018-11-13 DIAGNOSIS — M47816 Spondylosis without myelopathy or radiculopathy, lumbar region: Secondary | ICD-10-CM | POA: Diagnosis not present

## 2018-11-13 DIAGNOSIS — G8929 Other chronic pain: Secondary | ICD-10-CM | POA: Diagnosis not present

## 2018-11-13 DIAGNOSIS — M545 Low back pain: Secondary | ICD-10-CM | POA: Diagnosis not present

## 2018-11-13 MED ORDER — BETAMETHASONE SOD PHOS & ACET 6 (3-3) MG/ML IJ SUSP
12.0000 mg | Freq: Once | INTRAMUSCULAR | Status: AC
  Administered 2018-11-13: 12 mg

## 2018-11-13 NOTE — Progress Notes (Signed)
 .  Numeric Pain Rating Scale and Functional Assessment Average Pain 7 Pain Right Now 2 My pain is intermittent and aching Pain is worse with: walking Pain improves with: medication   In the last MONTH (on 0-10 scale) has pain interfered with the following?  1. General activity like being  able to carry out your everyday physical activities such as walking, climbing stairs, carrying groceries, or moving a chair?  Rating(5)  2. Relation with others like being able to carry out your usual social activities and roles such as  activities at home, at work and in your community. Rating(5)  3. Enjoyment of life such that you have  been bothered by emotional problems such as feeling anxious, depressed or irritable?  Rating(3)   +Driver, -BT, -Dye Allergy

## 2018-11-22 ENCOUNTER — Ambulatory Visit (INDEPENDENT_AMBULATORY_CARE_PROVIDER_SITE_OTHER): Payer: Medicare Other | Admitting: Pharmacist Clinician (PhC)/ Clinical Pharmacy Specialist

## 2018-11-22 ENCOUNTER — Ambulatory Visit: Payer: Medicare Other

## 2018-11-22 DIAGNOSIS — I10 Essential (primary) hypertension: Secondary | ICD-10-CM | POA: Diagnosis not present

## 2018-11-22 MED ORDER — HYDRALAZINE HCL 50 MG PO TABS: 50.0000 mg | ORAL_TABLET | Freq: Three times a day (TID) | ORAL | 3 refills | Status: DC

## 2018-11-22 MED ORDER — HYDRALAZINE HCL 50 MG PO TABS: 50.0000 mg | ORAL_TABLET | Freq: Two times a day (BID) | ORAL | 3 refills | Status: DC

## 2018-11-22 NOTE — Patient Instructions (Addendum)
  Your blood pressure today is   Check your blood pressure at home 3-4 times per week and keep record of the readings.  Take your BP meds as follows:  Continue with all current medications  Bring all of your meds, your BP cuff and your record of home blood pressures to your next appointment.  Exercise as you're able, try to walk approximately 30 minutes per day.  Keep salt intake to a minimum, especially watch canned and prepared boxed foods.  Eat more fresh fruits and vegetables and fewer canned items.  Avoid eating in fast food restaurants.    HOW TO TAKE YOUR BLOOD PRESSURE: . Rest 5 minutes before taking your blood pressure. .  Don't smoke or drink caffeinated beverages for at least 30 minutes before. . Take your blood pressure before (not after) you eat. . Sit comfortably with your back supported and both feet on the floor (don't cross your legs). . Elevate your arm to heart level on a table or a desk. . Use the proper sized cuff. It should fit smoothly and snugly around your bare upper arm. There should be enough room to slip a fingertip under the cuff. The bottom edge of the cuff should be 1 inch above the crease of the elbow. . Ideally, take 3 measurements at one sitting and record the average.

## 2018-11-22 NOTE — Assessment & Plan Note (Signed)
Patient with essential hypertension, now doing well with current medications and since discontinuing sulindac.  He will continue with current regimen and home BP monitoring.  He was told to average the readings each month and make sure they continue to stay in the low 130's.  He can call the office with any concerns or questions.

## 2018-11-22 NOTE — Progress Notes (Signed)
11/22/2018 SHADEE MONTOYA Nov 19, 1940 [id number]   HPI:  Frank Johnston is a 78 y.o. male patient of Dr Oval Linsey, with a PMH below who presents today for hypertension clinic follow up  His medical history is significant only for prior PE, dyspepsia and seasonal allergies.   We saw him in [facility] earlier in 2019 and he was well controlled on valsartan hctz 320/12.5 and spironolactone 12.5 mg.  Unfortunately he had a rise in SCr and the hctz and spironolactone were both discontinued.  He was then put on amlodipine 5 mg daily along with the valsartan.   Unfortunately he had edema from the amlodipine and it was discontinued in favor of doxazosin 2 mg.  Even after increasing this to 4 mg, he continued to have elevated blood pressure readings.    At his last visit with me, about 3 weeks ago, he was still having some lower extremity edema and feeling "blah".  We reviewed his medications in detail and made several changes.  For his chronic arthritis, I suggested tapering off the sulindac and starting OTC naproxen prn, as anti-inflammatories can cause hypertension.  Suggested he take that for 2-3 days when he has an arthritis flare, but otherwise only prn.  Also I restarted him on hctz 12.5 mg to see if we could get his swelling down. He had plenty of valsartan hctz 320/12.5 mg tablets from a prior prescription, so he restarted those tablets.  He had a BMET after 10 days, which showed a minimal (10%) increase in SCr.  He was also started on hydralazine 25 mg bid.  This was increased to 50 mg at a subsequent visit.     Today he returns for follow up.  He is feeling well today and has no complaints.  He did get steroid injection for his back last month and noted an increase in his BP for several days.  Other than that all of his readings were less than 140, and the average of his readings was 622 systolic.   Blood Pressure Goal:  130/80  Current Medications:  Valsartan/hctz  320/12.5 mg qd  Doxazosin 4  mg qhs  Hydralazine 50 mg bid   Family Hx:  Mother with hypertension - died at 40 probable stroke  Father died at 44 from asbestos related lung cancer  Brother with hypertension  Daughter with hypertension  Social Hx:  No tobacco, quit in 1996; no alcohol; 2 coffee at most in the mornings; occasional sweet tea or soda  Diet:  Recently has been eating out more as he and wife visit her mother in nursing facility most days.  previously was mostly home cooked meals, only adds salt to a few items; veggies are mostly fresh in spring/summer. Some canned in winter;  Plenty of chicken, some beef and pork, rare fish; eats some cracker, chips  Exercise:  Mows yard regularly, stays busy around house; walking more because MIL in nursing home  Home BP readings:  Home cuff several years old, previously read within 5 points of the office meter;  In the last 6 weeks 17 morning readings averaged 135/70 (down 5/5 points from last average)and 14 evening readings averaged 131/69 (down 8/2 points).    Intolerances  Knows took med several years ago that caused cough -  assume ACEI  Amlodipine - edema  SCr improved after stopping spironolactone and hctz   Labs:  09/20/18:  Na 139, K 4.8, Glu 94, BUN 30, SCr 1.36 GFR 49  09/06/18:  Na 141, K 4.8, Glu 80, BUN 24, SCr 1.23 GFR 56  08/06/18:   Na 141, K 4.4, Glu 113, BUN 31, SCr 1.61 GFR 40  Wt Readings from Last 3 Encounters:  11/13/18 200 lb (90.7 kg)  08/06/18 197 lb 3.2 oz (89.4 kg)  01/30/18 202 lb 3.2 oz (91.7 kg)   BP Readings from Last 3 Encounters:  11/22/18 104/72  11/13/18 (!) 148/90  10/02/18 136/78   Pulse Readings from Last 3 Encounters:  11/22/18 83  11/13/18 78  10/02/18 67    Current Outpatient Medications  Medication Sig Dispense Refill  . acetaminophen (TYLENOL) 500 MG tablet Take 1,000 mg by mouth daily as needed for moderate pain or headache.    Marland Kitchen amoxicillin (AMOXIL) 500 MG tablet TAKE FOUR TABLETS BY MOUTH 1 HOUR PTA    .  aspirin 81 MG tablet Take 81 mg by mouth every evening.     . fluticasone (FLONASE) 50 MCG/ACT nasal spray Place 2 sprays into the nose at bedtime as needed for allergies.     Marland Kitchen HYDROcodone-acetaminophen (NORCO) 5-325 MG per tablet Take 1 tablet by mouth every 6 (six) hours as needed for pain. 30 tablet 0  . LACTOBACILLUS PO Advanced Probiotic    . metoCLOPramide (REGLAN) 5 MG tablet Take 5 mg by mouth at bedtime.     Marland Kitchen omeprazole (PRILOSEC) 40 MG capsule Take 40 mg by mouth every evening.    . Probiotic Product (PROBIOTIC ADVANCED PO) Take 1 capsule by mouth at bedtime.     Marland Kitchen therapeutic multivitamin-minerals (THERAGRAN-M) tablet Take 1 tablet by mouth daily.    . valsartan-hydrochlorothiazide (DIOVAN-HCT) 320-12.5 MG tablet Take 1 tablet by mouth daily. 90 tablet 3  . vitamin E (VITAMIN E) 400 UNIT capsule Take 400 Units by mouth daily.    . hydrALAZINE (APRESOLINE) 50 MG tablet Take 1 tablet (50 mg total) by mouth 2 (two) times daily. 180 tablet 3   Current Facility-Administered Medications  Medication Dose Route Frequency Provider Last Rate Last Dose  . betamethasone acetate-betamethasone sodium phosphate (CELESTONE) injection 12 mg  12 mg Other Once Magnus Sinning, MD        Allergies  Allergen Reactions  . No Known Allergies     Past Medical History:  Diagnosis Date  . Arthritis   . Chest pain 10/02/2015  . Chronic kidney disease    hx kidney stones  . GERD (gastroesophageal reflux disease)   . H/O hiatal hernia   . Hypertension   . Wears glasses     Blood pressure 104/72, pulse 83, resp. rate 16, height 5\' 8"  (1.727 m), SpO2 95 %.  Essential hypertension Patient with essential hypertension, now doing well with current medications and since discontinuing sulindac.  He will continue with current regimen and home BP monitoring.  He was told to average the readings each month and make sure they continue to stay in the low 130's.  He can call the office with any concerns or  questions.    Frank Johnston PharmD CPP [facility] [address] [place], [place] [address] [phone]

## 2018-12-07 NOTE — Procedures (Signed)
Lumbosacral Transforaminal Epidural Steroid Injection - Sub-Pedicular Approach with Fluoroscopic Guidance  Patient: Frank Johnston      Date of Birth: 1941-07-03 MRN: [id number] PCP: Leanna Battles, MD      Visit Date: 11/13/2018   Universal Protocol:    Date/Time: 11/13/2018  Consent Given By: the patient  Position: PRONE  Additional Comments: Vital signs were monitored before and after the procedure. Patient was prepped and draped in the usual sterile fashion. The correct patient, procedure, and site was verified.   Injection Procedure Details:  Procedure Site One Meds Administered:  Meds ordered this encounter  Medications  . betamethasone acetate-betamethasone sodium phosphate (CELESTONE) injection 12 mg    Laterality: Right  Location/Site:  L5-S1  Needle size: 22 G  Needle type: Spinal  Needle Placement: Transforaminal  Findings:    -Comments: Excellent flow of contrast along the nerve and into the epidural space.  Procedure Details: After squaring off the end-plates to get a true AP view, the C-arm was positioned so that an oblique view of the foramen as noted above was visualized. The target area is just inferior to the "nose of the scotty dog" or sub pedicular. The soft tissues overlying this structure were infiltrated with 2-3 ml. of 1% Lidocaine without Epinephrine.  The spinal needle was inserted toward the target using a "trajectory" view along the fluoroscope beam.  Under AP and lateral visualization, the needle was advanced so it did not puncture dura and was located close the 6 O'Clock position of the pedical in AP tracterory. Biplanar projections were used to confirm position. Aspiration was confirmed to be negative for CSF and/or blood. A 1-2 ml. volume of Isovue-250 was injected and flow of contrast was noted at each level. Radiographs were obtained for documentation purposes.   After attaining the desired flow of contrast documented above, a 0.5  to 1.0 ml test dose of 0.25% Marcaine was injected into each respective transforaminal space.  The patient was observed for 90 seconds post injection.  After no sensory deficits were reported, and normal lower extremity motor function was noted,   the above injectate was administered so that equal amounts of the injectate were placed at each foramen (level) into the transforaminal epidural space.   Additional Comments:  The patient tolerated the procedure well Dressing: 2 x 2 sterile gauze and Band-Aid    Post-procedure details: Patient was observed during the procedure. Post-procedure instructions were reviewed.  Patient left the clinic in stable condition.

## 2018-12-07 NOTE — Progress Notes (Signed)
Frank Johnston - 78 y.o. male MRN [id number]  Date of birth: March 30, 1941  Office Visit Note: Visit Date: 11/13/2018 PCP: Leanna Battles, MD Referred by: Leanna Battles, MD  Subjective: Chief Complaint  Patient presents with  . Lower Back - Pain  . Right Thigh - Pain   HPI: Frank Johnston is a 78 y.o. male who comes in today At the request of Dr. Janie Morning for evaluation management of chronic worsening low back pain with right thigh pain that can refer at times down the leg past the knee.  He reports initial onset several months ago but has had a history of off-and-on back pain for years.  He has had a prior lumbar laminectomy at L5-S1 on the right remotely.  He reports pain that is worse with walking and household activities.  He has been using some hydrocodone through Dr. Philip Aspen with some pain relief.  He had a course of prednisone orally that seemed to help and then the symptoms did return.  He rates his pain as a 7 out of 10.  He denies any tingling or numbness.  He has had no focal weakness no bowel bladder difficulty or specific trauma.  He has had activity modification and therapy.  After failing conservative care to the severity of pain he did have an MRI performed and this is reviewed with the patient today with spine models and imaging and is reviewed in the notes below.  Review of Systems  Constitutional: Negative for chills, fever, malaise/fatigue and weight loss.  HENT: Negative for hearing loss and sinus pain.   Eyes: Negative for blurred vision, double vision and photophobia.  Respiratory: Negative for cough and shortness of breath.   Cardiovascular: Negative for chest pain, palpitations and leg swelling.  Gastrointestinal: Negative for abdominal pain, nausea and vomiting.  Genitourinary: Negative for flank pain.  Musculoskeletal: Positive for back pain. Negative for myalgias.       Right hip and leg pain  Skin: Negative for itching and rash.  Neurological:  Negative for tremors, focal weakness and weakness.  Endo/Heme/Allergies: Negative.   Psychiatric/Behavioral: Negative for depression.  All other systems reviewed and are negative.  Otherwise per HPI.  Assessment & Plan: Visit Diagnoses:  1. Spondylosis without myelopathy or radiculopathy, lumbar region   2. Chronic bilateral low back pain without sciatica     Plan: Findings:  Several months of worsening right hip and leg pain consistent with an L5 radicular pain.  He also has chronic low back pain that is consistent with facet mediated low back pain.  MRI is consistent with small amount of listhesis of L4 on L5 and L5 on S1 with facet arthropathy and prior surgery at L5-S1.  There is no focal nerve compression but there is moderate narrowing of the right foramen.  I think the best approach is a diagnostic L5 transforaminal injection.  Depending on relief could look at diagnostic medial branch blocks and radiofrequency ablation for his low back pain.  He will continue with current medication and activity level.  Injection was performed today given the pain level that he was having.  As of note patient has a little bit of a transitional segment on MRI.    Meds & Orders:  Meds ordered this encounter  Medications  . betamethasone acetate-betamethasone sodium phosphate (CELESTONE) injection 12 mg    Orders Placed This Encounter  Procedures  . XR C-ARM NO REPORT  . Epidural Steroid injection    Follow-up: Return if  symptoms worsen or fail to improve.   Procedures: No procedures performed  Lumbosacral Transforaminal Epidural Steroid Injection - Sub-Pedicular Approach with Fluoroscopic Guidance  Patient: Frank Johnston      Date of Birth: 1940/11/04 MRN: [id number] PCP: Leanna Battles, MD      Visit Date: 11/13/2018   Universal Protocol:    Date/Time: 11/13/2018  Consent Given By: the patient  Position: PRONE  Additional Comments: Vital signs were monitored before and after  the procedure. Patient was prepped and draped in the usual sterile fashion. The correct patient, procedure, and site was verified.   Injection Procedure Details:  Procedure Site One Meds Administered:  Meds ordered this encounter  Medications  . betamethasone acetate-betamethasone sodium phosphate (CELESTONE) injection 12 mg    Laterality: Right  Location/Site:  L5-S1  Needle size: 22 G  Needle type: Spinal  Needle Placement: Transforaminal  Findings:    -Comments: Excellent flow of contrast along the nerve and into the epidural space.  Procedure Details: After squaring off the end-plates to get a true AP view, the C-arm was positioned so that an oblique view of the foramen as noted above was visualized. The target area is just inferior to the "nose of the scotty dog" or sub pedicular. The soft tissues overlying this structure were infiltrated with 2-3 ml. of 1% Lidocaine without Epinephrine.  The spinal needle was inserted toward the target using a "trajectory" view along the fluoroscope beam.  Under AP and lateral visualization, the needle was advanced so it did not puncture dura and was located close the 6 O'Clock position of the pedical in AP tracterory. Biplanar projections were used to confirm position. Aspiration was confirmed to be negative for CSF and/or blood. A 1-2 ml. volume of Isovue-250 was injected and flow of contrast was noted at each level. Radiographs were obtained for documentation purposes.   After attaining the desired flow of contrast documented above, a 0.5 to 1.0 ml test dose of 0.25% Marcaine was injected into each respective transforaminal space.  The patient was observed for 90 seconds post injection.  After no sensory deficits were reported, and normal lower extremity motor function was noted,   the above injectate was administered so that equal amounts of the injectate were placed at each foramen (level) into the transforaminal epidural  space.   Additional Comments:  The patient tolerated the procedure well Dressing: 2 x 2 sterile gauze and Band-Aid    Post-procedure details: Patient was observed during the procedure. Post-procedure instructions were reviewed.  Patient left the clinic in stable condition.     Clinical History: MRI LUMBAR SPINE WITHOUT AND WITH CONTRAST  TECHNIQUE: Multiplanar and multiecho pulse sequences of the lumbar spine were obtained without and with intravenous contrast.  CONTRAST:  13mL MULTIHANCE GADOBENATE DIMEGLUMINE 529 MG/ML IV SOLN  COMPARISON:  Lumbar MRI 07/15/2014 and earlier.  FINDINGS: Segmentation: Normal on a 2008 CT Abdomen and Pelvis. There is a vestigial S1-S2 disc space. This is the same numbering system used on the prior MRI.  Alignment: Stable since 2008. Chronic mild grade 1 anterolisthesis of L4 on L5 and similar mild retrolisthesis of L5 on S1.  Vertebrae: No acute osseous abnormality aside for trace degenerative endplate marrow edema and enhancement anteriorly superiorly at L1 (series 3, image 6). Normal background bone marrow signal. Intact visible sacrum and SI joints.  Conus medullaris and cauda equina: Conus extends to the L1-L2 level. No lower spinal cord or conus signal abnormality. No abnormal intradural enhancement. No  dural thickening.  Paraspinal and other soft tissues: Chronic renal cysts are partially visible and appear benign. Otherwise negative.  Disc levels:  T11-T12: Mild facet hypertrophy.  No stenosis.  T12-L1:  Negative.  L1-L2:  Mild facet hypertrophy.  No stenosis.  L2-L3: Minor circumferential disc bulge. Mild facet and ligament flavum hypertrophy. No stenosis.  L3-L4: Mild mostly far lateral disc bulging. Mild facet hypertrophy on the right. No stenosis.  L4-L5: Mild chronic anterolisthesis and circumferential disc bulge with broad-based posterior component. Moderate facet hypertrophy. Resolved  degenerative facet joint fluid since 2015. Stable mild spinal stenosis. No convincing lateral recess or foraminal stenosis.  L5-S1: Chronic mild retrolisthesis with circumferential disc osteophyte complex. Previous right laminectomy. Mild to moderate residual facet hypertrophy. No spinal or lateral recess stenosis. Mild left and mild to moderate right L5 foraminal stenosis is stable.  IMPRESSION: 1. Stable MRI appearance of the lumbar spine since 2015. No new or progressed neural impingement. 2. Mild chronic spondylolisthesis at L4-L5 and L5-S1, with remote prior surgery at the latter. Chronic mild spinal stenosis at L4-L5, and mild to moderate foraminal stenosis at L5-S1.   Electronically Signed   By: Genevie Ann M.D.   On: 10/19/2018 20:28   He reports that he quit smoking about 24 years ago. His smoking use included cigarettes. He has a 40.00 pack-year smoking history. He has never used smokeless tobacco. No results for input(s): HGBA1C, LABURIC in the last 8760 hours.  Objective:  VS:  HT:5\' 8"  (172.7 cm)   WT:200 lb (90.7 kg)  BMI:30.42    BP:(!) 148/90  HR:78bpm  TEMP:98 F (36.7 C)(Oral)  RESP:  Physical Exam Vitals signs and nursing note reviewed.  Constitutional:      General: He is not in acute distress.    Appearance: He is well-developed.  HENT:     Head: Normocephalic and atraumatic.     Nose: Nose normal.     Mouth/Throat:     Mouth: Mucous membranes are moist.     Pharynx: Oropharynx is clear.  Eyes:     Conjunctiva/sclera: Conjunctivae normal.     Pupils: Pupils are equal, round, and reactive to light.  Neck:     Musculoskeletal: Normal range of motion and neck supple.     Trachea: No tracheal deviation.  Cardiovascular:     Rate and Rhythm: Normal rate and regular rhythm.     Pulses: Normal pulses.  Pulmonary:     Effort: Pulmonary effort is normal.     Breath sounds: Normal breath sounds.  Abdominal:     General: There is no distension.      Palpations: Abdomen is soft.     Tenderness: There is no guarding or rebound.  Musculoskeletal:        General: No deformity.     Right lower leg: No edema.     Left lower leg: No edema.     Comments: Patient ambulates without aid he has pain with extension and facet joint loading of the low back.  He has no pain over the greater trochanters or PSIS.  No pain with hip rotation and good distal strength bilaterally.  He has an equivocally positive slump test on the right and not the left.  He has no clonus bilaterally.  Skin:    General: Skin is warm and dry.     Findings: No erythema or rash.  Neurological:     General: No focal deficit present.     Mental Status: He is alert  and oriented to person, place, and time.     Motor: No abnormal muscle tone.     Coordination: Coordination normal.     Gait: Gait normal.  Psychiatric:        Mood and Affect: Mood normal.        Behavior: Behavior normal.        Thought Content: Thought content normal.     Ortho Exam Imaging: No results found.  Past Medical/Family/Surgical/Social History: Medications & Allergies reviewed per EMR, new medications updated. Patient Active Problem List   Diagnosis Date Noted  . 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 Dyspnea 02/04/2016  . Chest pain 10/02/2015  . DVT (deep venous thrombosis) ([place]) 03/02/2014  . Pulmonary embolism on left ([place]) 02/28/2014  . Knee pain, right 02/28/2014  . PE (pulmonary embolism) 02/28/2014  . Essential hypertension 01/19/2008  . DYSPEPSIA 01/19/2008  . ILEUS 01/19/2008  . RENAL CALCULUS 01/19/2008  . ARTHRITIS 01/19/2008  . COLONIC POLYPS 08/01/2005  . INTERNAL HEMORRHOIDS 08/01/2005   Past Medical History:  Diagnosis Date  . Arthritis   . Chest pain 10/02/2015  . Chronic kidney disease    hx kidney stones  . GERD (gastroesophageal reflux disease)   . H/O hiatal hernia   . Hypertension   . Wears glasses    Family History  Problem Relation Age of Onset   . Stroke Mother   . Cancer Father   . Hypertension Child   . Hypertension Child    Past Surgical History:  Procedure Laterality Date  . [place] VITRECTOMY WITH 20 GAUGE MVR PORT Right 09/06/2016   Procedure: 25 GAUGE PARS PLANA VITRECTOMY WITH 20 GAUGE MVR PORT;  Surgeon: [name], MD;  Location: [facility];  Service: Ophthalmology;  Laterality: Right;  . APPENDECTOMY  1976  . BACK SURGERY  1984   lumbar lam  . CARPAL TUNNEL RELEASE  12/06/2011   Procedure: CARPAL TUNNEL RELEASE;  Surgeon: Wynonia Sours, MD;  Location: [facility];  Service: Orthopedics;  Laterality: Right;  . CARPAL TUNNEL RELEASE  08/07/2012   Procedure: CARPAL TUNNEL RELEASE;  Surgeon: Wynonia Sours, MD;  Location: [facility];  Service: Orthopedics;  Laterality: Left;  carpal tunnel release  . CHOLECYSTECTOMY  1976  . COLONOSCOPY    . JOINT REPLACEMENT  4/12   lt total knee  . KNEE ARTHROSCOPY     rt and lt  . KNEE CLOSED REDUCTION  6/12   lt -after lt total knee  . TRIGGER FINGER RELEASE  12/06/2011   Procedure: RELEASE TRIGGER FINGER/A-1 PULLEY;  Surgeon: Wynonia Sours, MD;  Location: [facility];  Service: Orthopedics;  Laterality: Right;  right thumb, middle, and ring fingers  . TRIGGER FINGER RELEASE  08/07/2012   Procedure: RELEASE TRIGGER FINGER/A-1 PULLEY;  Surgeon: Wynonia Sours, MD;  Location: [facility];  Service: Orthopedics;  Laterality: Left;  trigger release left ring finger   Social History   Occupational History  . Occupation: retired  Tobacco Use  . Smoking status: Former Smoker    Packs/day: 1.00    Years: 40.00    Pack years: 40.00    Types: Cigarettes    Last attempt to quit: 11/30/1994    Years since quitting: 24.0  . Smokeless tobacco: Never Used  Substance and Sexual Activity  . Alcohol use: No    Alcohol/week: 0.0 standard drinks  . Drug use: No  . Sexual activity:  Not on file

## 2018-12-27 ENCOUNTER — Encounter (INDEPENDENT_AMBULATORY_CARE_PROVIDER_SITE_OTHER): Payer: Medicare Other | Admitting: Ophthalmology

## 2019-01-10 DIAGNOSIS — R82998 Other abnormal findings in urine: Secondary | ICD-10-CM | POA: Diagnosis not present

## 2019-01-10 DIAGNOSIS — E7849 Other hyperlipidemia: Secondary | ICD-10-CM | POA: Diagnosis not present

## 2019-01-10 DIAGNOSIS — Z125 Encounter for screening for malignant neoplasm of prostate: Secondary | ICD-10-CM | POA: Diagnosis not present

## 2019-01-10 DIAGNOSIS — I10 Essential (primary) hypertension: Secondary | ICD-10-CM | POA: Diagnosis not present

## 2019-01-17 DIAGNOSIS — M25561 Pain in right knee: Secondary | ICD-10-CM | POA: Diagnosis not present

## 2019-01-17 DIAGNOSIS — Z86711 Personal history of pulmonary embolism: Secondary | ICD-10-CM | POA: Diagnosis not present

## 2019-01-17 DIAGNOSIS — N183 Chronic kidney disease, stage 3 (moderate): Secondary | ICD-10-CM | POA: Diagnosis not present

## 2019-01-17 DIAGNOSIS — Z0000 Encounter for general adult medical examination without abnormal findings: Secondary | ICD-10-CM | POA: Diagnosis not present

## 2019-01-17 DIAGNOSIS — M5136 Other intervertebral disc degeneration, lumbar region: Secondary | ICD-10-CM | POA: Diagnosis not present

## 2019-01-17 DIAGNOSIS — E669 Obesity, unspecified: Secondary | ICD-10-CM | POA: Diagnosis not present

## 2019-01-17 DIAGNOSIS — Z1339 Encounter for screening examination for other mental health and behavioral disorders: Secondary | ICD-10-CM | POA: Diagnosis not present

## 2019-01-21 ENCOUNTER — Ambulatory Visit (INDEPENDENT_AMBULATORY_CARE_PROVIDER_SITE_OTHER): Payer: Medicare Other | Admitting: Podiatry

## 2019-01-21 ENCOUNTER — Encounter: Payer: Self-pay | Admitting: Podiatry

## 2019-01-21 ENCOUNTER — Other Ambulatory Visit: Payer: Self-pay

## 2019-01-21 VITALS — Temp 96.9°F

## 2019-01-21 DIAGNOSIS — M79675 Pain in left toe(s): Secondary | ICD-10-CM

## 2019-01-21 DIAGNOSIS — M79674 Pain in right toe(s): Secondary | ICD-10-CM | POA: Diagnosis not present

## 2019-01-21 DIAGNOSIS — B351 Tinea unguium: Secondary | ICD-10-CM | POA: Diagnosis not present

## 2019-01-21 DIAGNOSIS — L608 Other nail disorders: Secondary | ICD-10-CM

## 2019-01-21 NOTE — Progress Notes (Signed)
Complaint:  Visit Type: Patient returns to my office for continued preventative foot care services. Complaint: Patient states" my nails have grown long and thick and become painful to walk and wear shoes"  The patient presents for preventative foot care services. No changes to ROS  Podiatric Exam: Vascular: dorsalis pedis and posterior tibial pulses are palpable bilateral. Capillary return is immediate. Temperature gradient is WNL. Skin turgor WNL  Sensorium: Normal Semmes Weinstein monofilament test. Normal tactile sensation bilaterally. Nail Exam: Pt has thick disfigured discolored nails with subungual debris noted bilateral entire nail halluxes. Ulcer Exam: There is no evidence of ulcer or pre-ulcerative changes or infection. Orthopedic Exam: Muscle tone and strength are WNL. No limitations in general ROM. No crepitus or effusions noted. Foot type and digits show no abnormalities. Bony prominences are unremarkable. Skin: No Porokeratosis. No infection or ulcers  Diagnosis:  Onychomycosis, , Pain in right toe, pain in left toes  Treatment & Plan Procedures and Treatment: Consent by patient was obtained for treatment procedures.   Debridement of mycotic and hypertrophic toenails, 1 through 5 bilateral and clearing of subungual debris. No ulceration, no infection noted.  Return Visit-Office Procedure: Patient instructed to return to the office for a follow up visit 10 weeks  for continued evaluation and treatment.    Gardiner Barefoot DPM

## 2019-01-30 ENCOUNTER — Other Ambulatory Visit: Payer: Self-pay

## 2019-01-30 ENCOUNTER — Encounter (INDEPENDENT_AMBULATORY_CARE_PROVIDER_SITE_OTHER): Payer: Medicare Other | Admitting: Ophthalmology

## 2019-01-30 DIAGNOSIS — H35371 Puckering of macula, right eye: Secondary | ICD-10-CM

## 2019-01-30 DIAGNOSIS — H43813 Vitreous degeneration, bilateral: Secondary | ICD-10-CM | POA: Diagnosis not present

## 2019-01-30 DIAGNOSIS — I10 Essential (primary) hypertension: Secondary | ICD-10-CM

## 2019-01-30 DIAGNOSIS — H35033 Hypertensive retinopathy, bilateral: Secondary | ICD-10-CM | POA: Diagnosis not present

## 2019-02-17 ENCOUNTER — Encounter (HOSPITAL_COMMUNITY): Payer: Self-pay

## 2019-02-17 ENCOUNTER — Other Ambulatory Visit: Payer: Self-pay

## 2019-02-17 ENCOUNTER — Emergency Department (HOSPITAL_COMMUNITY)
Admission: EM | Admit: 2019-02-17 | Discharge: 2019-02-18 | Disposition: A | Discharge status: Home / Self Care | Payer: Medicare Other | Attending: Emergency Medicine | Admitting: Emergency Medicine

## 2019-02-17 ENCOUNTER — Emergency Department (HOSPITAL_COMMUNITY): Payer: Medicare Other

## 2019-02-17 DIAGNOSIS — M546 Pain in thoracic spine: Secondary | ICD-10-CM | POA: Insufficient documentation

## 2019-02-17 DIAGNOSIS — M545 Low back pain: Secondary | ICD-10-CM | POA: Diagnosis not present

## 2019-02-17 DIAGNOSIS — Z87891 Personal history of nicotine dependence: Secondary | ICD-10-CM | POA: Diagnosis not present

## 2019-02-17 DIAGNOSIS — S3992XA Unspecified injury of lower back, initial encounter: Secondary | ICD-10-CM | POA: Diagnosis not present

## 2019-02-17 DIAGNOSIS — Z7982 Long term (current) use of aspirin: Secondary | ICD-10-CM | POA: Insufficient documentation

## 2019-02-17 DIAGNOSIS — R000 Tachycardia, unspecified: Secondary | ICD-10-CM | POA: Diagnosis not present

## 2019-02-17 DIAGNOSIS — Y939 Activity, unspecified: Secondary | ICD-10-CM | POA: Diagnosis not present

## 2019-02-17 DIAGNOSIS — M25512 Pain in left shoulder: Secondary | ICD-10-CM | POA: Insufficient documentation

## 2019-02-17 DIAGNOSIS — T07XXXA Unspecified multiple injuries, initial encounter: Secondary | ICD-10-CM | POA: Diagnosis present

## 2019-02-17 DIAGNOSIS — S43109A Unspecified dislocation of unspecified acromioclavicular joint, initial encounter: Secondary | ICD-10-CM | POA: Diagnosis not present

## 2019-02-17 DIAGNOSIS — S43101A Unspecified dislocation of right acromioclavicular joint, initial encounter: Secondary | ICD-10-CM | POA: Diagnosis not present

## 2019-02-17 DIAGNOSIS — S199XXA Unspecified injury of neck, initial encounter: Secondary | ICD-10-CM | POA: Diagnosis not present

## 2019-02-17 DIAGNOSIS — M549 Dorsalgia, unspecified: Secondary | ICD-10-CM | POA: Diagnosis not present

## 2019-02-17 DIAGNOSIS — M25511 Pain in right shoulder: Secondary | ICD-10-CM | POA: Insufficient documentation

## 2019-02-17 DIAGNOSIS — Y999 Unspecified external cause status: Secondary | ICD-10-CM | POA: Insufficient documentation

## 2019-02-17 DIAGNOSIS — R52 Pain, unspecified: Secondary | ICD-10-CM | POA: Diagnosis not present

## 2019-02-17 DIAGNOSIS — S51001A Unspecified open wound of right elbow, initial encounter: Secondary | ICD-10-CM | POA: Diagnosis not present

## 2019-02-17 DIAGNOSIS — S299XXA Unspecified injury of thorax, initial encounter: Secondary | ICD-10-CM | POA: Diagnosis not present

## 2019-02-17 DIAGNOSIS — Z86718 Personal history of other venous thrombosis and embolism: Secondary | ICD-10-CM | POA: Diagnosis not present

## 2019-02-17 DIAGNOSIS — S1093XA Contusion of unspecified part of neck, initial encounter: Secondary | ICD-10-CM | POA: Diagnosis not present

## 2019-02-17 DIAGNOSIS — S0001XA Abrasion of scalp, initial encounter: Secondary | ICD-10-CM | POA: Insufficient documentation

## 2019-02-17 DIAGNOSIS — M542 Cervicalgia: Secondary | ICD-10-CM | POA: Insufficient documentation

## 2019-02-17 DIAGNOSIS — R51 Headache: Secondary | ICD-10-CM | POA: Diagnosis not present

## 2019-02-17 DIAGNOSIS — I10 Essential (primary) hypertension: Secondary | ICD-10-CM | POA: Diagnosis not present

## 2019-02-17 DIAGNOSIS — W109XXA Fall (on) (from) unspecified stairs and steps, initial encounter: Secondary | ICD-10-CM | POA: Insufficient documentation

## 2019-02-17 DIAGNOSIS — R079 Chest pain, unspecified: Secondary | ICD-10-CM | POA: Diagnosis not present

## 2019-02-17 DIAGNOSIS — S4991XA Unspecified injury of right shoulder and upper arm, initial encounter: Secondary | ICD-10-CM | POA: Diagnosis not present

## 2019-02-17 DIAGNOSIS — M5489 Other dorsalgia: Secondary | ICD-10-CM | POA: Diagnosis not present

## 2019-02-17 DIAGNOSIS — S4992XA Unspecified injury of left shoulder and upper arm, initial encounter: Secondary | ICD-10-CM | POA: Diagnosis not present

## 2019-02-17 DIAGNOSIS — Y92009 Unspecified place in unspecified non-institutional (private) residence as the place of occurrence of the external cause: Secondary | ICD-10-CM | POA: Diagnosis not present

## 2019-02-17 DIAGNOSIS — S0990XA Unspecified injury of head, initial encounter: Secondary | ICD-10-CM | POA: Diagnosis not present

## 2019-02-17 DIAGNOSIS — S300XXA Contusion of lower back and pelvis, initial encounter: Secondary | ICD-10-CM | POA: Diagnosis not present

## 2019-02-17 DIAGNOSIS — Z86711 Personal history of pulmonary embolism: Secondary | ICD-10-CM | POA: Insufficient documentation

## 2019-02-17 DIAGNOSIS — S43102A Unspecified dislocation of left acromioclavicular joint, initial encounter: Secondary | ICD-10-CM | POA: Diagnosis not present

## 2019-02-17 DIAGNOSIS — I959 Hypotension, unspecified: Secondary | ICD-10-CM | POA: Diagnosis not present

## 2019-02-17 LAB — CBC WITH DIFFERENTIAL/PLATELET
Abs Immature Granulocytes: 0.11 10*3/uL — ABNORMAL HIGH (ref 0.00–0.07)
Basophils Absolute: 0.1 10*3/uL (ref 0.0–0.1)
Basophils Relative: 1 %
Eosinophils Absolute: 0.2 10*3/uL (ref 0.0–0.5)
Eosinophils Relative: 1 %
HCT: 43 % (ref 39.0–52.0)
Hemoglobin: 14.6 g/dL (ref 13.0–17.0)
Immature Granulocytes: 1 %
Lymphocytes Relative: 10 %
Lymphs Abs: 1.5 10*3/uL (ref 0.7–4.0)
MCH: 29.3 pg (ref 26.0–34.0)
MCHC: 34 g/dL (ref 30.0–36.0)
MCV: 86.2 fL (ref 80.0–100.0)
Monocytes Absolute: 1.2 10*3/uL — ABNORMAL HIGH (ref 0.1–1.0)
Monocytes Relative: 8 %
Neutro Abs: 11.4 10*3/uL — ABNORMAL HIGH (ref 1.7–7.7)
Neutrophils Relative %: 79 %
Platelets: 196 10*3/uL (ref 150–400)
RBC: 4.99 MIL/uL (ref 4.22–5.81)
RDW: 13.2 % (ref 11.5–15.5)
WBC: 14.6 10*3/uL — ABNORMAL HIGH (ref 4.0–10.5)
nRBC: 0 % (ref 0.0–0.2)

## 2019-02-17 LAB — PROTIME-INR
INR: 1 (ref 0.8–1.2)
Prothrombin Time: 12.8 seconds (ref 11.4–15.2)

## 2019-02-17 LAB — COMPREHENSIVE METABOLIC PANEL
ALT: 23 U/L (ref 0–44)
AST: 33 U/L (ref 15–41)
Albumin: 3.9 g/dL (ref 3.5–5.0)
Alkaline Phosphatase: 54 U/L (ref 38–126)
Anion gap: 13 (ref 5–15)
BUN: 26 mg/dL — ABNORMAL HIGH (ref 8–23)
CO2: 22 mmol/L (ref 22–32)
Calcium: 9.7 mg/dL (ref 8.9–10.3)
Chloride: 102 mmol/L (ref 98–111)
Creatinine, Ser: 1.52 mg/dL — ABNORMAL HIGH (ref 0.61–1.24)
GFR calc Af Amer: 50 mL/min — ABNORMAL LOW (ref >60)
GFR calc non Af Amer: 43 mL/min — ABNORMAL LOW (ref >60)
Glucose, Bld: 127 mg/dL — ABNORMAL HIGH (ref 70–99)
Potassium: 3.8 mmol/L (ref 3.5–5.1)
Sodium: 137 mmol/L (ref 135–145)
Total Bilirubin: 0.9 mg/dL (ref 0.3–1.2)
Total Protein: 6.6 g/dL (ref 6.5–8.1)

## 2019-02-17 MED ORDER — MORPHINE SULFATE (PF) 4 MG/ML IV SOLN
8.0000 mg | Freq: Once | INTRAVENOUS | Status: AC
  Administered 2019-02-17: 8 mg via INTRAVENOUS
  Filled 2019-02-17: qty 2

## 2019-02-17 MED ORDER — HYDROCODONE-ACETAMINOPHEN 5-325 MG PO TABS
1.0000 | ORAL_TABLET | Freq: Once | ORAL | Status: AC
  Administered 2019-02-17: 1 via ORAL
  Filled 2019-02-17: qty 1

## 2019-02-17 NOTE — ED Triage Notes (Signed)
Per [facility], pt from home w/ a c/o right shoulder pain and lower back pain that he sustained from a fall. Pt reported to be moving furniture when he fell down ~5-6 steps. He made impact with the wall as well as the floor. Injuries noted to his R shoulder, skin tear to R elbow, and minor abrasion noted to the R occipital/parietal region of his head. No LOC. No head or neck pain. Possible dislocation/displacement of right shoulder. Presents as anteriorly displaced. PMS intact.   132/74 HR 88 RR 20 99% RA  No blood thinners.  100 mcg of Fentanyl via 20 ga IV L hand

## 2019-02-17 NOTE — Discharge Instructions (Addendum)
We saw you in the ER after you had a fall. It seems like you have a right-sided AC separation, otherwise you likely have contusions and the pain might get worse over the next few days. Please follow-up with the orthopedic doctor as requested.

## 2019-02-17 NOTE — ED Notes (Signed)
Patient transported to CT 

## 2019-02-17 NOTE — ED Notes (Signed)
Right shoulder immobilizer applied and patient ambulated to restroom with assistance. Tolerated well!

## 2019-02-18 MED ORDER — HYDROCODONE-ACETAMINOPHEN 5-325 MG PO TABS: 1.0000 | ORAL_TABLET | Freq: Three times a day (TID) | ORAL | 0 refills | Status: AC | PRN

## 2019-02-18 NOTE — ED Notes (Signed)
Patient verbalizes understanding of discharge instructions. Opportunity for questioning and answers were provided. Armband removed by staff, pt discharged from ED.  

## 2019-02-19 DIAGNOSIS — R8290 Unspecified abnormal findings in urine: Secondary | ICD-10-CM | POA: Diagnosis not present

## 2019-02-19 NOTE — ED Provider Notes (Signed)
[facility] EMERGENCY DEPARTMENT Provider Note   CSN: [id number] Arrival date & time: 02/17/19  2011    History   Chief Complaint Chief Complaint  Patient presents with   Fall   Shoulder Injury    HPI Frank Johnston is a 78 y.o. male.     HPI  78 year old male comes in a chief complaint of fall.  Patient reports that he had a mechanical fall at home.  Patient fell down about 5-6 stairs.  He is complaining of shoulder pain, back pain.  Patient denies loss of consciousness, headaches, neck pain, focal numbness, tingling, chest pain, shortness of breath.  He is not on any blood thinners.  Patient's past medical history is positive for CKD, hypertension.  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   Dyspnea 02/04/2016   Chest pain 10/02/2015   DVT (deep venous thrombosis) ([place]) 03/02/2014   Pulmonary embolism on left ([place]) 02/28/2014   Knee pain, right 02/28/2014   PE (pulmonary embolism) 02/28/2014   Essential hypertension 01/19/2008   DYSPEPSIA 01/19/2008   ILEUS 01/19/2008   RENAL CALCULUS 01/19/2008   ARTHRITIS 01/19/2008   COLONIC POLYPS 08/01/2005   INTERNAL HEMORRHOIDS 08/01/2005    Past Surgical History:  Procedure Laterality Date   25 GAUGE PARS PLANA VITRECTOMY WITH 20 GAUGE MVR PORT Right 09/06/2016   Procedure: 25 GAUGE PARS PLANA VITRECTOMY WITH 20 GAUGE MVR PORT;  Surgeon: [name], MD;  Location: [place];  Service: Ophthalmology;  Laterality: Right;   [place]   lumbar lam   CARPAL TUNNEL RELEASE  12/06/2011   Procedure: CARPAL TUNNEL RELEASE;   Surgeon: Wynonia Sours, MD;  Location: [place];  Service: Orthopedics;  Laterality: Right;   CARPAL TUNNEL RELEASE  08/07/2012   Procedure: CARPAL TUNNEL RELEASE;  Surgeon: Wynonia Sours, MD;  Location: [place];  Service: Orthopedics;  Laterality: Left;  carpal tunnel release   CHOLECYSTECTOMY  1976   COLONOSCOPY     JOINT REPLACEMENT  4/12   lt total knee   KNEE ARTHROSCOPY     rt and lt   KNEE CLOSED REDUCTION  6/12   lt -after lt total knee   TRIGGER FINGER RELEASE  12/06/2011   Procedure: RELEASE TRIGGER FINGER/A-1 PULLEY;  Surgeon: Wynonia Sours, MD;  Location: [place];  Service: Orthopedics;  Laterality: Right;  right thumb, middle, and ring fingers   TRIGGER FINGER RELEASE  08/07/2012   Procedure: RELEASE TRIGGER FINGER/A-1 PULLEY;  Surgeon: Wynonia Sours, MD;  Location: [place];  Service: Orthopedics;  Laterality: Left;  trigger release left ring finger        Home Medications    Prior to Admission medications   Medication Sig Start Date End Date Taking? Authorizing Provider  acetaminophen (TYLENOL) 500 MG tablet Take 1,000 mg by mouth daily as needed for moderate pain or headache.    [provider]  amoxicillin (AMOXIL) 500 MG tablet TAKE FOUR TABLETS BY MOUTH 1 HOUR PTA 10/12/18  [provider]  aspirin 81 MG tablet Take 81 mg by mouth every evening.     [provider]  fluticasone (FLONASE) 50 MCG/ACT nasal spray Place 2 sprays into the nose at bedtime as needed for allergies.     [provider]  hydrALAZINE (APRESOLINE) 50 MG tablet Take 1 tablet (50 mg total) by mouth 2 (two) times daily. 11/22/18 02/20/19  Skeet Latch, MD  HYDROcodone-acetaminophen (NORCO/VICODIN) 5-325 MG tablet Take 1 tablet by mouth every 8 (eight) hours as needed for up to 3 days for severe pain. 02/17/19 02/20/19  Varney Biles, MD  LACTOBACILLUS PO Advanced Probiotic    [provider]  metoCLOPramide (REGLAN) 5 MG tablet Take 5 mg by mouth at bedtime.     [provider]  omeprazole (PRILOSEC) 40 MG capsule Take 40 mg by mouth every evening.    [provider]  Probiotic Product (PROBIOTIC ADVANCED PO) Take 1 capsule by mouth at bedtime.     [provider]  therapeutic multivitamin-minerals (THERAGRAN-M) tablet Take 1 tablet by mouth daily.    [provider]  valsartan-hydrochlorothiazide (DIOVAN-HCT) 320-12.5 MG tablet Take 1 tablet by mouth daily. 10/02/18   Skeet Latch, MD  vitamin E (VITAMIN E) 400 UNIT capsule Take 400 Units by mouth daily.    [provider]    Family History Family History  Problem Relation Age of Onset   Stroke Mother    Cancer Father    Hypertension Child    Hypertension Child     Social History Social History   Tobacco Use   Smoking status: Former Smoker    Packs/day: 1.00    Years: 40.00    Pack years: 40.00    Types: Cigarettes    Last attempt to quit: 11/30/1994    Years since quitting: 24.2   Smokeless tobacco: Never Used  Substance Use Topics   Alcohol use: No    Alcohol/week: 0.0 standard drinks   Drug use: No     Allergies   No known allergies   Review of Systems Review of Systems  Constitutional: Positive for activity change.  Eyes: Negative for visual disturbance.  Respiratory: Negative for shortness of breath.   Cardiovascular: Negative for chest pain.  Gastrointestinal: Negative for abdominal pain.  Musculoskeletal: Positive for arthralgias.  Neurological: Negative for dizziness and headaches.  Hematological: Does not bruise/bleed easily.  All other systems reviewed and are negative.    Physical Exam Updated Vital Signs BP (!) 151/82    Pulse 92    Temp 98.4 F (36.9 C) (Oral)    Resp 14    Ht 5\' 8"  (1.727 m)    Wt 88.5 kg    SpO2 99%    BMI 29.65 kg/m   Physical Exam Vitals signs and nursing note reviewed.  Constitutional:       Appearance: He is well-developed.  HENT:     Head: Normocephalic and atraumatic.  Eyes:     Conjunctiva/sclera: Conjunctivae normal.     Pupils: Pupils are equal, round, and reactive to light.  Neck:     Musculoskeletal: Normal range of motion and neck supple.  Cardiovascular:     Rate and Rhythm: Normal rate and regular rhythm.  Pulmonary:     Effort: Pulmonary effort is normal.     Breath sounds: Normal breath sounds.  Abdominal:     General: Bowel sounds are normal. There is no distension.     Palpations: Abdomen is soft. There is  no mass.     Tenderness: There is no abdominal tenderness. There is no guarding or rebound.  Musculoskeletal:        General: Tenderness present. No deformity.     Right lower leg: No edema.     Left lower leg: No edema.     Comments: Tenderness over bilateral shoulder, neck, upper and lower back over the midline.  Skin:    General: Skin is warm.  Neurological:     Mental Status: He is alert and oriented to person, place, and time.      ED Treatments / Results  Labs (all labs ordered are listed, but only abnormal results are displayed) Labs Reviewed  COMPREHENSIVE METABOLIC PANEL - Abnormal; Notable for the following components:      Result Value   Glucose, Bld 127 (*)    BUN 26 (*)    Creatinine, Ser 1.52 (*)    GFR calc non Af Amer 43 (*)    GFR calc Af Amer 50 (*)    All other components within normal limits  CBC WITH DIFFERENTIAL/PLATELET - Abnormal; Notable for the following components:   WBC 14.6 (*)    Neutro Abs 11.4 (*)    Monocytes Absolute 1.2 (*)    Abs Immature Granulocytes 0.11 (*)    All other components within normal limits  PROTIME-INR    EKG None  Radiology Dg Chest 2 View  Result Date: 02/17/2019 CLINICAL DATA:  Fall, pain EXAM: CHEST - 2 VIEW COMPARISON:  None. FINDINGS: Cardiomegaly. Both lungs are clear. Disc degenerative disease of the thoracic spine. IMPRESSION: Cardiomegaly without acute abnormality  of the lungs. Electronically Signed   By: Eddie Candle M.D.   On: 02/17/2019 22:37   Dg Thoracic Spine 2 View  Result Date: 02/17/2019 CLINICAL DATA:  78 y/o M; fall with bilateral shoulder pain and back pain. EXAM: LUMBAR SPINE - COMPLETE 4+ VIEW; THORACIC SPINE 2 VIEWS COMPARISON:  08/18/2015 CT chest. 10/19/2018 lumbar spine MRI. FINDINGS: Thoracic spine: There is no evidence of lumbar spine fracture. Alignment is normal. Mild multilevel discogenic degenerative changes of the thoracic spine with small endplate marginal osteophytes. Stable mild anterior wedging of T7 vertebral body, likely degenerative. Lumbar spine: Normal lumbar lordosis. Stable L4-5 minimal grade 1 anterolisthesis. L1 mild compression deformity is new from prior lumbar spine MRI. Vertebral body heights are otherwise well maintained. Mild stable loss of height of the L5-S1 intervertebral disc space. IMPRESSION: New mild L1 compression deformity from 10/19/2018. Stable degenerative changes of the thoracic and lumbar spine. Electronically Signed   By: Kristine Garbe M.D.   On: 02/17/2019 22:43   Dg Lumbar Spine Complete  Result Date: 02/17/2019 CLINICAL DATA:  78 y/o M; fall with bilateral shoulder pain and back pain. EXAM: LUMBAR SPINE - COMPLETE 4+ VIEW; THORACIC SPINE 2 VIEWS COMPARISON:  08/18/2015 CT chest. 10/19/2018 lumbar spine MRI. FINDINGS: Thoracic spine: There is no evidence of lumbar spine fracture. Alignment is normal. Mild multilevel discogenic degenerative changes of the thoracic spine with small endplate marginal osteophytes. Stable mild anterior wedging of T7 vertebral body, likely degenerative. Lumbar spine: Normal lumbar lordosis. Stable L4-5 minimal grade 1 anterolisthesis. L1 mild compression deformity is new from prior lumbar spine MRI. Vertebral body heights are otherwise well maintained. Mild stable loss of height of the L5-S1 intervertebral disc space. IMPRESSION: New mild L1 compression deformity from  10/19/2018. Stable degenerative changes of the thoracic and lumbar spine. Electronically Signed   By: Edgardo Roys.D.  On: 02/17/2019 22:43   Dg Shoulder Right  Result Date: 02/17/2019 CLINICAL DATA:  Fall, pain EXAM: RIGHT SHOULDER - 2+ VIEW; LEFT SHOULDER - 2+ VIEW COMPARISON:  None. FINDINGS: There is elevation of the right distal clavicle at the acromioclavicular joint. Correlate for acromioclavicular laxity. No fracture appreciated. The glenohumeral joints are in anatomic apposition. There is moderate bilateral acromioclavicular and glenohumeral arthrosis. IMPRESSION: There is elevation of the right distal clavicle at the acromioclavicular joint. Correlate for acromioclavicular laxity. No fracture appreciated. The glenohumeral joints are in anatomic apposition. There is moderate bilateral acromioclavicular and glenohumeral arthrosis. Electronically Signed   By: Eddie Candle M.D.   On: 02/17/2019 22:41   Dg Shoulder Left  Result Date: 02/17/2019 CLINICAL DATA:  Fall, pain EXAM: RIGHT SHOULDER - 2+ VIEW; LEFT SHOULDER - 2+ VIEW COMPARISON:  None. FINDINGS: There is elevation of the right distal clavicle at the acromioclavicular joint. Correlate for acromioclavicular laxity. No fracture appreciated. The glenohumeral joints are in anatomic apposition. There is moderate bilateral acromioclavicular and glenohumeral arthrosis. IMPRESSION: There is elevation of the right distal clavicle at the acromioclavicular joint. Correlate for acromioclavicular laxity. No fracture appreciated. The glenohumeral joints are in anatomic apposition. There is moderate bilateral acromioclavicular and glenohumeral arthrosis. Electronically Signed   By: Eddie Candle M.D.   On: 02/17/2019 22:41    Procedures Procedures (including critical care time)  Medications Ordered in ED Medications  morphine 4 MG/ML injection 8 mg (8 mg Intravenous Given 02/17/19 2130)  HYDROcodone-acetaminophen (NORCO/VICODIN) 5-325 MG  per tablet 1 tablet (1 tablet Oral Given 02/17/19 2337)     Initial Impression / Assessment and Plan / ED Course  I have reviewed the triage vital signs and the nursing notes.  Pertinent labs & imaging results that were available during my care of the patient were reviewed by me and considered in my medical decision making (see chart for details).        Patient comes in a chief complaint of fall   DDx includes: - Mechanical falls - ICH - Fractures - Contusions - Soft tissue injury  Appropriate imaging ordered.  Based on our assessment, it appears that he has an AC separation.  Wrist with radiographs appear reassuring.  Stable for discharge.   Final Clinical Impressions(s) / ED Diagnoses   Final diagnoses:  Separation of acromioclavicular joint, unspecified laterality, initial encounter  Multiple contusions    ED Discharge Orders         Ordered    HYDROcodone-acetaminophen (NORCO/VICODIN) 5-325 MG tablet  Every 8 hours PRN     02/18/19 0000           Varney Biles, MD 02/19/19 2208

## 2019-02-22 ENCOUNTER — Telehealth: Payer: Self-pay | Admitting: Orthopaedic Surgery

## 2019-02-22 ENCOUNTER — Other Ambulatory Visit: Payer: Self-pay | Admitting: Physical Medicine and Rehabilitation

## 2019-02-22 MED ORDER — PREDNISONE 50 MG PO TABS: ORAL_TABLET | ORAL | 0 refills | Status: DC

## 2019-02-22 NOTE — Telephone Encounter (Signed)
I called and advised that his rx was sent to [facility]

## 2019-02-22 NOTE — Telephone Encounter (Signed)
I sent in Rx for prednisone but looks like he has follow up with Dr. Ninfa Linden on 5/27 that says status post fall on 5/17 and was seen at  ED - not sure if he was seen by him on-call or told to follow up. May be issue with shoulder per ED notes?

## 2019-02-22 NOTE — Telephone Encounter (Signed)
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

## 2019-02-22 NOTE — Telephone Encounter (Signed)
Patient wife called in said patient is having hard time moving around getting around. She's asking to see if she can get him a prescription in for prednisone to help get him by till his visit. Called primary doctor and they told her to contact [facility] office. Please contact wife with update

## 2019-02-22 NOTE — Telephone Encounter (Signed)
Please advise, looks like you only saw him once

## 2019-02-27 ENCOUNTER — Encounter: Payer: Self-pay | Admitting: Orthopaedic Surgery

## 2019-02-27 ENCOUNTER — Ambulatory Visit (INDEPENDENT_AMBULATORY_CARE_PROVIDER_SITE_OTHER): Payer: Medicare Other | Admitting: Orthopaedic Surgery

## 2019-02-27 ENCOUNTER — Other Ambulatory Visit: Payer: Self-pay

## 2019-02-27 DIAGNOSIS — G8929 Other chronic pain: Secondary | ICD-10-CM | POA: Diagnosis not present

## 2019-02-27 DIAGNOSIS — M25561 Pain in right knee: Secondary | ICD-10-CM

## 2019-02-27 DIAGNOSIS — M25511 Pain in right shoulder: Secondary | ICD-10-CM

## 2019-02-27 MED ORDER — METHYLPREDNISOLONE 4 MG PO TABS: ORAL_TABLET | ORAL | 0 refills | Status: DC

## 2019-02-27 MED ORDER — LIDOCAINE HCL 1 % IJ SOLN
3.0000 mL | INTRAMUSCULAR | Status: AC | PRN
  Administered 2019-02-27: 3 mL

## 2019-02-27 MED ORDER — METHYLPREDNISOLONE ACETATE 40 MG/ML IJ SUSP
40.0000 mg | INTRAMUSCULAR | Status: AC | PRN
  Administered 2019-02-27: 40 mg via INTRA_ARTICULAR

## 2019-02-27 MED ORDER — HYDROCODONE-ACETAMINOPHEN 7.5-325 MG PO TABS: 1.0000 | ORAL_TABLET | Freq: Four times a day (QID) | ORAL | 0 refills | Status: DC | PRN

## 2019-02-27 NOTE — Progress Notes (Signed)
Office Visit Note   Patient: Frank Johnston           Date of Birth: Nov 03, 1940           MRN: [id number] Visit Date: 02/27/2019              Requested by: Leanna Battles, [address] [place], [place] [address] PCP: Leanna Battles, MD   Assessment & Plan: Visit Diagnoses:  1. Acute pain of right shoulder   2. Chronic pain of right knee     Plan: I did place a steroid injection in his right knee today to help with his acute pain.  I want him to come out of the sling is much as he can to get his shoulder moving better.  I will try 1 more round of steroids as well as an occasional hydrocodone.  We will see him back in about 2 weeks to see how he is doing overall.  Follow-Up Instructions: Return in about 2 weeks (around 03/13/2019).   Orders:  Orders Placed This Encounter  Procedures  . Large Joint Inj   Meds ordered this encounter  Medications  . methylPREDNISolone (MEDROL) 4 MG tablet    Sig: Medrol dose pack. Take as instructed    Dispense:  21 tablet    Refill:  0  . HYDROcodone-acetaminophen (NORCO) 7.5-325 MG tablet    Sig: Take 1 tablet by mouth every 6 (six) hours as needed for moderate pain.    Dispense:  30 tablet    Refill:  0      Procedures: Large Joint Inj: R knee on 02/27/2019 9:14 AM Indications: diagnostic evaluation and pain Details: 22 G 1.5 in needle, superolateral approach  Arthrogram: No  Medications: 3 mL lidocaine 1 %; 40 mg methylPREDNISolone acetate 40 MG/ML Outcome: tolerated well, no immediate complications Procedure, treatment alternatives, risks and benefits explained, specific risks discussed. Consent was given by the patient. Immediately prior to procedure a time out was called to verify the correct patient, procedure, equipment, support staff and site/side marked as required. Patient was prepped and draped in the usual sterile fashion.       Clinical Data: No additional findings.   Subjective: Chief Complaint   Patient presents with  . Right Shoulder - Pain  The patient comes in today with multiple musculoskeletal complaints and issues after having a very hard mechanical fall down some steps a week and a half ago.  He was actually seen in the emergency room and CT scan was obtained of his head and his neck.  He also had x-rays of both shoulders and his lumbar spine.  He is 78 years old.  He is in a sling on his right arm due to an apparent acute AC shoulder separation.  He has had some chronic issues with the right shoulder.  He says he really cannot lift both arms much above his head.  He is also complaining of right knee pain.  He has known severe end-stage arthritis of the right knee and that has flared things up given the fall that he had.  He was recently placed on a steroid for 5 days and he said that helped greatly.  He is trying to be as active as possible but he is barely been out of the sling on his right arm.  He also has abrasions at his right elbow that he wants me take a look at.  HPI  Review of Systems He currently denies any headache, chest  pain, shortness of breath, fever, chills, nausea, vomiting  Objective: Vital Signs: There were no vitals taken for this visit.  Physical Exam He is alert and orient x3 and in no acute distress Ortho Exam Examination of his right shoulder does show some pain at the [facility] joint but it is mild pain and is not as much consistent with a separation.  I do feel that he has a chronic AC joint separation.  The shoulder is well located but definitely weak in terms of the rotator cuff.  His left shoulder does show integrity of the rotator cuff.  His right knee hurts along the medial joint line.  He has problems fully abducting both shoulders. Specialty Comments:  No specialty comments available.  Imaging: No results found. All x-rays were independently reviewed from the emergency room including CT scan of his neck.  I looked at x-rays of the shoulders and I do  see that appears to be [facility] joint separation on the right side but it is likely chronic due to the calcifications are seen around the [facility] joint.  Both shoulders are well located but slightly high riding.  There was no gross deformities of the lumbar spine on either view.  PMFS History: Patient Active Problem List   Diagnosis Date Noted  . History of adenomatous polyp of colon 01/08/2018  . 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 Dyspnea 02/04/2016  . Chest pain 10/02/2015  . DVT (deep venous thrombosis) ([place]) 03/02/2014  . Pulmonary embolism on left ([place]) 02/28/2014  . Knee pain, right 02/28/2014  . PE (pulmonary embolism) 02/28/2014  . Essential hypertension 01/19/2008  . DYSPEPSIA 01/19/2008  . ILEUS 01/19/2008  . RENAL CALCULUS 01/19/2008  . ARTHRITIS 01/19/2008  . COLONIC POLYPS 08/01/2005  . INTERNAL HEMORRHOIDS 08/01/2005   Past Medical History:  Diagnosis Date  . Arthritis   . Chest pain 10/02/2015  . Chronic kidney disease    hx kidney stones  . GERD (gastroesophageal reflux disease)   . H/O hiatal hernia   . Hypertension   . Wears glasses     Family History  Problem Relation Age of Onset  . Stroke Mother   . Cancer Father   . Hypertension Child   . Hypertension Child     Past Surgical History:  Procedure Laterality Date  . [place] VITRECTOMY WITH 20 GAUGE MVR PORT Right 09/06/2016   Procedure: 25 GAUGE PARS PLANA VITRECTOMY WITH 20 GAUGE MVR PORT;  Surgeon: [name], MD;  Location: [place];  Service: Ophthalmology;  Laterality: Right;  . APPENDECTOMY  1976  . BACK SURGERY  1984   lumbar lam  . CARPAL TUNNEL RELEASE  12/06/2011   Procedure: CARPAL TUNNEL RELEASE;  Surgeon: Wynonia Sours, MD;  Location: [place];  Service: Orthopedics;  Laterality: Right;  . CARPAL TUNNEL RELEASE  08/07/2012   Procedure: CARPAL TUNNEL RELEASE;  Surgeon: Wynonia Sours, MD;  Location: [place];  Service: Orthopedics;  Laterality:  Left;  carpal tunnel release  . CHOLECYSTECTOMY  1976  . COLONOSCOPY    . JOINT REPLACEMENT  4/12   lt total knee  . KNEE ARTHROSCOPY     rt and lt  . KNEE CLOSED REDUCTION  6/12   lt -after lt total knee  . TRIGGER FINGER RELEASE  12/06/2011   Procedure: RELEASE TRIGGER FINGER/A-1 PULLEY;  Surgeon: Wynonia Sours, MD;  Location: [place];  Service: Orthopedics;  Laterality: Right;  right thumb, middle, and ring fingers  . TRIGGER FINGER RELEASE  08/07/2012   Procedure: RELEASE TRIGGER FINGER/A-1 PULLEY;  Surgeon: Wynonia Sours, MD;  Location: [place];  Service: Orthopedics;  Laterality: Left;  trigger release left ring finger   Social History   Occupational History  . Occupation: retired  Tobacco Use  . Smoking status: Former Smoker    Packs/day: 1.00    Years: 40.00    Pack years: 40.00    Types: Cigarettes    Last attempt to quit: 11/30/1994    Years since quitting: 24.2  . Smokeless tobacco: Never Used  Substance and Sexual Activity  . Alcohol use: No    Alcohol/week: 0.0 standard drinks  . Drug use: No  . Sexual activity: Not on file

## 2019-03-02 ENCOUNTER — Other Ambulatory Visit: Payer: Medicare Other

## 2019-03-02 ENCOUNTER — Telehealth: Payer: Self-pay

## 2019-03-02 DIAGNOSIS — Z20822 Contact with and (suspected) exposure to covid-19: Secondary | ICD-10-CM

## 2019-03-02 NOTE — Telephone Encounter (Signed)
Spoke with wife patricia, states that pt is asymptomatic for covid 19. Patient requesting testing at [facility] at 1pm @5 /30/20.

## 2019-03-02 NOTE — Telephone Encounter (Signed)
Pt has been schedule for 03-02-2019 at 115 pm

## 2019-03-04 LAB — NOVEL CORONAVIRUS, NAA: SARS-CoV-2, NAA: NOT DETECTED

## 2019-03-13 ENCOUNTER — Encounter: Payer: Self-pay | Admitting: Orthopaedic Surgery

## 2019-03-13 ENCOUNTER — Other Ambulatory Visit: Payer: Self-pay

## 2019-03-13 ENCOUNTER — Ambulatory Visit (INDEPENDENT_AMBULATORY_CARE_PROVIDER_SITE_OTHER): Payer: Medicare Other | Admitting: Orthopaedic Surgery

## 2019-03-13 DIAGNOSIS — M25511 Pain in right shoulder: Secondary | ICD-10-CM

## 2019-03-13 DIAGNOSIS — S32010A Wedge compression fracture of first lumbar vertebra, initial encounter for closed fracture: Secondary | ICD-10-CM | POA: Diagnosis not present

## 2019-03-13 MED ORDER — METHYLPREDNISOLONE ACETATE 40 MG/ML IJ SUSP
40.0000 mg | INTRAMUSCULAR | Status: AC | PRN
  Administered 2019-03-13: 40 mg via INTRA_ARTICULAR

## 2019-03-13 MED ORDER — LIDOCAINE HCL 1 % IJ SOLN
3.0000 mL | INTRAMUSCULAR | Status: AC | PRN
  Administered 2019-03-13: 3 mL

## 2019-03-13 MED ORDER — HYDROCODONE-ACETAMINOPHEN 7.5-325 MG PO TABS: 1.0000 | ORAL_TABLET | Freq: Four times a day (QID) | ORAL | 0 refills | Status: DC | PRN

## 2019-03-13 NOTE — Progress Notes (Signed)
Office Visit Note   Patient: Frank Johnston           Date of Birth: [birth year]           MRN: [id number] Visit Date: 03/13/2019              Requested by: Leanna Battles, [place], [place] [address] PCP: Leanna Battles, MD   Assessment & Plan: Visit Diagnoses:  1. Acute pain of right shoulder     Plan: I do feel that he would benefit from a steroid injection in his right shoulder as does he and he tolerated it well.  Having had injections before he understands the risk and benefits of these.  I would like to see him back in 4 weeks.  He still having low back pain.  After his fall a month ago x-rays in the emergency room showed a small compression deformity of L1.  When I see him back in 4 weeks out like a standing AP and lateral of the lumbar spine.  All question concerns were answered and addressed.  I have encouraged him to stop wearing the sling and I did send in some more hydrocodone which I counseled him about.  Follow-Up Instructions: Return in about 4 weeks (around 04/10/2019).   Orders:  Orders Placed This Encounter  Procedures  . Large Joint Inj: R subacromial bursa   Meds ordered this encounter  Medications  . HYDROcodone-acetaminophen (NORCO) 7.5-325 MG tablet    Sig: Take 1 tablet by mouth every 6 (six) hours as needed for moderate pain.    Dispense:  30 tablet    Refill:  0      Procedures: Large Joint Inj: R subacromial bursa on 03/13/2019 2:43 PM Indications: pain and diagnostic evaluation Details: 22 G 1.5 in needle  Arthrogram: No  Medications: 3 mL lidocaine 1 %; 40 mg methylPREDNISolone acetate 40 MG/ML Outcome: tolerated well, no immediate complications Procedure, treatment alternatives, risks and benefits explained, specific risks discussed. Consent was given by the patient. Immediately prior to procedure a time out was called to verify the correct patient, procedure, equipment, support staff and site/side marked as required.  Patient was prepped and draped in the usual sterile fashion.       Clinical Data: No additional findings.   Subjective: Chief Complaint  Patient presents with  . Right Shoulder - Follow-up  . Right Knee - Follow-up  It is been about a month now since the patient fell down some stairs injuring his right shoulder his right knee and his lumbar spine.  They felt that he had a AC separation but it appears chronic to me.  He still wearing a sling due to pain in his shoulder.  He also reports good relief from a steroid injection of placed in his right knee although his right knee does feel like his can give out on him on occasion.  He still complaining of low back pain.  He is denying radicular symptoms.  HPI  Review of Systems He currently denies any headache, chest pain, shortness of breath, fever, chills, nausea, vomiting  Objective: Vital Signs: There were no vitals taken for this visit.  Physical Exam He is alert and orient x3 and in no acute distress Ortho Exam Examination of the lumbar spine shows pain across the upper lumbar spine into the paraspinal muscles but no radicular complaints.  He is negative straight leg raise bilaterally.  He has some slight varus malalignment of his right knee  with medial joint line tenderness.  He has positive Murray sign to the medial compartment.  Examination of his right shoulder shows improved range of motion overall but still pain with overhead activities and likely chronic rotator cuff deficiency.  There is no pain at the [facility] joint. Specialty Comments:  No specialty comments available.  Imaging: No results found.   PMFS History: Patient Active Problem List   Diagnosis Date Noted  . History of adenomatous polyp of colon 01/08/2018  . 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 Dyspnea 02/04/2016  . Chest pain 10/02/2015  . DVT (deep venous thrombosis) ([place]) 03/02/2014  . Pulmonary embolism on left ([place]) 02/28/2014  . Knee pain, right  02/28/2014  . PE (pulmonary embolism) 02/28/2014  . Essential hypertension 01/19/2008  . DYSPEPSIA 01/19/2008  . ILEUS 01/19/2008  . RENAL CALCULUS 01/19/2008  . ARTHRITIS 01/19/2008  . COLONIC POLYPS 08/01/2005  . INTERNAL HEMORRHOIDS 08/01/2005   Past Medical History:  Diagnosis Date  . Arthritis   . Chest pain 10/02/2015  . Chronic kidney disease    hx kidney stones  . GERD (gastroesophageal reflux disease)   . H/O hiatal hernia   . Hypertension   . Wears glasses     Family History  Problem Relation Age of Onset  . Stroke Mother   . Cancer Father   . Hypertension Child   . Hypertension Child     Past Surgical History:  Procedure Laterality Date  . [place] VITRECTOMY WITH 20 GAUGE MVR PORT Right 09/06/2016   Procedure: 25 GAUGE PARS PLANA VITRECTOMY WITH 20 GAUGE MVR PORT;  Surgeon: [name], MD;  Location: [facility];  Service: Ophthalmology;  Laterality: Right;  . APPENDECTOMY  1976  . BACK SURGERY  1984   lumbar lam  . CARPAL TUNNEL RELEASE  12/06/2011   Procedure: CARPAL TUNNEL RELEASE;  Surgeon: Wynonia Sours, MD;  Location: [facility];  Service: Orthopedics;  Laterality: Right;  . CARPAL TUNNEL RELEASE  08/07/2012   Procedure: CARPAL TUNNEL RELEASE;  Surgeon: Wynonia Sours, MD;  Location: [facility];  Service: Orthopedics;  Laterality: Left;  carpal tunnel release  . CHOLECYSTECTOMY  1976  . COLONOSCOPY    . JOINT REPLACEMENT  4/12   lt total knee  . KNEE ARTHROSCOPY     rt and lt  . KNEE CLOSED REDUCTION  6/12   lt -after lt total knee  . TRIGGER FINGER RELEASE  12/06/2011   Procedure: RELEASE TRIGGER FINGER/A-1 PULLEY;  Surgeon: Wynonia Sours, MD;  Location: [facility];  Service: Orthopedics;  Laterality: Right;  right thumb, middle, and ring fingers  . TRIGGER FINGER RELEASE  08/07/2012   Procedure: RELEASE TRIGGER FINGER/A-1 PULLEY;  Surgeon: Wynonia Sours, MD;  Location: [facility];  Service:  Orthopedics;  Laterality: Left;  trigger release left ring finger   Social History   Occupational History  . Occupation: retired  Tobacco Use  . Smoking status: Former Smoker    Packs/day: 1.00    Years: 40.00    Pack years: 40.00    Types: Cigarettes    Last attempt to quit: 11/30/1994    Years since quitting: 24.2  . Smokeless tobacco: Never Used  Substance and Sexual Activity  . Alcohol use: No    Alcohol/week: 0.0 standard drinks  . Drug use: No  . Sexual activity: Not on file

## 2019-04-01 ENCOUNTER — Ambulatory Visit: Payer: Medicare Other | Admitting: Podiatry

## 2019-04-04 ENCOUNTER — Encounter: Payer: Self-pay | Admitting: Podiatry

## 2019-04-04 ENCOUNTER — Ambulatory Visit (INDEPENDENT_AMBULATORY_CARE_PROVIDER_SITE_OTHER): Payer: Medicare Other | Admitting: Podiatry

## 2019-04-04 ENCOUNTER — Other Ambulatory Visit: Payer: Self-pay

## 2019-04-04 DIAGNOSIS — B351 Tinea unguium: Secondary | ICD-10-CM

## 2019-04-04 DIAGNOSIS — M79674 Pain in right toe(s): Secondary | ICD-10-CM | POA: Diagnosis not present

## 2019-04-04 DIAGNOSIS — M79675 Pain in left toe(s): Secondary | ICD-10-CM | POA: Diagnosis not present

## 2019-04-04 NOTE — Progress Notes (Signed)
Complaint:  Visit Type: Patient returns to my office for continued preventative foot care services. Complaint: Patient states" my nails have grown long and thick and become painful to walk and wear shoes"  The patient presents for preventative foot care services. No changes to ROS  Podiatric Exam: Vascular: dorsalis pedis and posterior tibial pulses are palpable bilateral. Capillary return is immediate. Temperature gradient is WNL. Skin turgor WNL  Sensorium: Normal Semmes Weinstein monofilament test. Normal tactile sensation bilaterally. Nail Exam: Pt has thick disfigured discolored nails with subungual debris noted bilateral entire nail halluxes. Ulcer Exam: There is no evidence of ulcer or pre-ulcerative changes or infection. Orthopedic Exam: Muscle tone and strength are WNL. No limitations in general ROM. No crepitus or effusions noted. Foot type and digits show no abnormalities. Bony prominences are unremarkable. Skin: No Porokeratosis. No infection or ulcers  Diagnosis:  Onychomycosis, , Pain in right toe, pain in left toes  Treatment & Plan Procedures and Treatment: Consent by patient was obtained for treatment procedures.   Debridement of mycotic and hypertrophic toenails, 1 through 5 bilateral and clearing of subungual debris. No ulceration, no infection noted.  Return Visit-Office Procedure: Patient instructed to return to the office for a follow up visit 10 weeks  for continued evaluation and treatment.    Gardiner Barefoot DPM

## 2019-04-08 DIAGNOSIS — H40013 Open angle with borderline findings, low risk, bilateral: Secondary | ICD-10-CM | POA: Diagnosis not present

## 2019-04-10 ENCOUNTER — Encounter: Payer: Self-pay | Admitting: Orthopaedic Surgery

## 2019-04-10 ENCOUNTER — Ambulatory Visit (INDEPENDENT_AMBULATORY_CARE_PROVIDER_SITE_OTHER): Payer: Medicare Other | Admitting: Orthopaedic Surgery

## 2019-04-10 ENCOUNTER — Other Ambulatory Visit: Payer: Self-pay

## 2019-04-10 ENCOUNTER — Ambulatory Visit (INDEPENDENT_AMBULATORY_CARE_PROVIDER_SITE_OTHER): Payer: Medicare Other

## 2019-04-10 DIAGNOSIS — S32010A Wedge compression fracture of first lumbar vertebra, initial encounter for closed fracture: Secondary | ICD-10-CM | POA: Diagnosis not present

## 2019-04-10 DIAGNOSIS — M25512 Pain in left shoulder: Secondary | ICD-10-CM | POA: Diagnosis not present

## 2019-04-10 MED ORDER — METHYLPREDNISOLONE ACETATE 40 MG/ML IJ SUSP
40.0000 mg | INTRAMUSCULAR | Status: AC | PRN
  Administered 2019-04-10: 40 mg via INTRA_ARTICULAR

## 2019-04-10 MED ORDER — LIDOCAINE HCL 1 % IJ SOLN
3.0000 mL | INTRAMUSCULAR | Status: AC | PRN
  Administered 2019-04-10: 3 mL

## 2019-04-10 NOTE — Progress Notes (Signed)
Office Visit Note   Patient: Frank Johnston           Date of Birth: 12/26/1940           MRN: [id number] Visit Date: 04/10/2019              Requested by: Leanna Battles, [place],  [place] [address] PCP: Leanna Battles, MD   Assessment & Plan: Visit Diagnoses:  1. Closed compression fracture of body of L1 vertebra (HCC)   2. Acute pain of left shoulder     Plan: Since he did so well with a steroid injection in his right shoulder he wishes to have one in his left shoulder today and I agree with this.  He tolerated it well.  All question concerns were answered and addressed.  I did give him reassurance that his spine is doing well.  If he does develop weakness in his legs or worsening numbness and tingling he will let us know.  Otherwise, we do not need to see him back for 2 months just to make sure he is doing well.  No x-rays are needed at that visit.  Follow-Up Instructions: Return in about 2 months (around 06/11/2019).   Orders:  Orders Placed This Encounter  Procedures  . Large Joint Inj  . XR Lumbar Spine 2-3 Views   No orders of the defined types were placed in this encounter.     Procedures: Large Joint Inj on 04/10/2019 1:59 PM Indications: pain and diagnostic evaluation Details: 22 G 1.5 in needle  Arthrogram: No  Medications: 3 mL lidocaine 1 %; 40 mg methylPREDNISolone acetate 40 MG/ML Outcome: tolerated well, no immediate complications Procedure, treatment alternatives, risks and benefits explained, specific risks discussed. Consent was given by the patient. Immediately prior to procedure a time out was called to verify the correct patient, procedure, equipment, support staff and site/side marked as required. Patient was prepped and draped in the usual sterile fashion.       Clinical Data: No additional findings.   Subjective: Chief Complaint  Patient presents with  . Lower Back - Follow-up  The patient is a very pleasant  78 year old gentleman who fell down some steps about 6 to 7 weeks ago.  He did sustain an L1 compression fracture but has had minimal back pain.  He reports just some slight numbness in his thighs.  He also injured both shoulders when he fell on stairs but sustained no fracture.  We did place a steroid injection in his right shoulder 4 weeks ago and he said that really helped.  He says his left shoulder is bothering him and he would like to have a steroid in his left shoulder today.  He says otherwise he feels like things are improving.  He said his back does not hurt significantly at all.  He walks without assistive device and gets out of a chair easily.  HPI  Review of Systems He currently denies any headache, chest pain, shortness of breath, fever, chills, nausea, vomiting  Objective: Vital Signs: There were no vitals taken for this visit.  Physical Exam He is alert and orient x3 and in no acute distress Ortho Exam Both shoulders show some deficits in the rotator cuff with limited overhead activity but both have no significant pain.  His right shoulder moves better than he did before but shows weakness in the rotator cuff itself.  Both shoulders are well located.  He has good flexion-extension of his lumbar  spine and minimal pain to palpation.  Neither leg shows any weakness. Specialty Comments:  No specialty comments available.  Imaging: Xr Lumbar Spine 2-3 Views  Result Date: 04/10/2019 2 views of the lumbar spine compared to previous films show an L1 compression fracture with close to 50% loss in height.  This is only slightly worsened when compared to previous films.    PMFS History: Patient Active Problem List   Diagnosis Date Noted  . Pain due to onychomycosis of toenails of both feet 04/04/2019  . History of adenomatous polyp of colon 01/08/2018  . 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 Dyspnea 02/04/2016  . Chest pain 10/02/2015  . DVT (deep venous thrombosis) ([place])  03/02/2014  . Pulmonary embolism on left ([place]) 02/28/2014  . Knee pain, right 02/28/2014  . PE (pulmonary embolism) 02/28/2014  . Essential hypertension 01/19/2008  . DYSPEPSIA 01/19/2008  . ILEUS 01/19/2008  . RENAL CALCULUS 01/19/2008  . ARTHRITIS 01/19/2008  . COLONIC POLYPS 08/01/2005  . INTERNAL HEMORRHOIDS 08/01/2005   Past Medical History:  Diagnosis Date  . Arthritis   . Chest pain 10/02/2015  . Chronic kidney disease    hx kidney stones  . GERD (gastroesophageal reflux disease)   . H/O hiatal hernia   . Hypertension   . Wears glasses     Family History  Problem Relation Age of Onset  . Stroke Mother   . Cancer Father   . Hypertension Child   . Hypertension Child     Past Surgical History:  Procedure Laterality Date  . [place] VITRECTOMY WITH 20 GAUGE MVR PORT Right 09/06/2016   Procedure: 25 GAUGE PARS PLANA VITRECTOMY WITH 20 GAUGE MVR PORT;  Surgeon: [name], MD;  Location: [facility];  Service: Ophthalmology;  Laterality: Right;  . APPENDECTOMY  1976  . BACK SURGERY  1984   lumbar lam  . CARPAL TUNNEL RELEASE  12/06/2011   Procedure: CARPAL TUNNEL RELEASE;  Surgeon: Wynonia Sours, MD;  Location: [facility];  Service: Orthopedics;  Laterality: Right;  . CARPAL TUNNEL RELEASE  08/07/2012   Procedure: CARPAL TUNNEL RELEASE;  Surgeon: Wynonia Sours, MD;  Location: [facility];  Service: Orthopedics;  Laterality: Left;  carpal tunnel release  . CHOLECYSTECTOMY  1976  . COLONOSCOPY    . JOINT REPLACEMENT  4/12   lt total knee  . KNEE ARTHROSCOPY     rt and lt  . KNEE CLOSED REDUCTION  6/12   lt -after lt total knee  . TRIGGER FINGER RELEASE  12/06/2011   Procedure: RELEASE TRIGGER FINGER/A-1 PULLEY;  Surgeon: Wynonia Sours, MD;  Location: [facility];  Service: Orthopedics;  Laterality: Right;  right thumb, middle, and ring fingers  . TRIGGER FINGER RELEASE  08/07/2012   Procedure: RELEASE TRIGGER FINGER/A-1  PULLEY;  Surgeon: Wynonia Sours, MD;  Location: [facility];  Service: Orthopedics;  Laterality: Left;  trigger release left ring finger   Social History   Occupational History  . Occupation: retired  Tobacco Use  . Smoking status: Former Smoker    Packs/day: 1.00    Years: 40.00    Pack years: 40.00    Types: Cigarettes    Quit date: 11/30/1994    Years since quitting: 24.3  . Smokeless tobacco: Never Used  Substance and Sexual Activity  . Alcohol use: No    Alcohol/week: 0.0 standard drinks  . Drug use: No  . Sexual  activity: Not on file

## 2019-06-06 DIAGNOSIS — Z23 Encounter for immunization: Secondary | ICD-10-CM | POA: Diagnosis not present

## 2019-06-12 ENCOUNTER — Encounter: Payer: Self-pay | Admitting: Orthopaedic Surgery

## 2019-06-12 ENCOUNTER — Other Ambulatory Visit: Payer: Self-pay

## 2019-06-12 ENCOUNTER — Ambulatory Visit (INDEPENDENT_AMBULATORY_CARE_PROVIDER_SITE_OTHER): Payer: Medicare Other | Admitting: Orthopaedic Surgery

## 2019-06-12 DIAGNOSIS — M25512 Pain in left shoulder: Secondary | ICD-10-CM | POA: Diagnosis not present

## 2019-06-12 DIAGNOSIS — G8929 Other chronic pain: Secondary | ICD-10-CM

## 2019-06-12 DIAGNOSIS — S32010A Wedge compression fracture of first lumbar vertebra, initial encounter for closed fracture: Secondary | ICD-10-CM

## 2019-06-12 DIAGNOSIS — M5442 Lumbago with sciatica, left side: Secondary | ICD-10-CM

## 2019-06-12 NOTE — Progress Notes (Signed)
The patient comes in today with continued left shoulder pain as well as radicular symptoms going down his right side.  He has had a previous ESI by Dr. Ernestina Patches at the right L5-S1 back in February.  He had a mechanical fall and sustained a L1 compression fracture.  He seems to be recovering from that.  I have injected both the shoulders and the subacromial space.  The right shoulder did help the left shoulder still hurts quite a bit with overhead activities.  On examination he has limited mobility of both shoulders.  There is a lot of grinding of the glenohumeral joint and AC joint left side and is painful to him.  He still has some radicular symptoms going down his right side but good mobility of his spine and his hips and lower extremities.  It is just painful to him in terms of numbness and tingling going down the leg.  At this point I would like to send him to Dr. Ernestina Patches for 2 different injections.  He is not a diabetic.  It will be worth having a repeat ESI at likely right-sided L5-S1 where he had it before because this was helpful.  I would also like Dr. Ernestina Patches to provide a steroid injection in at least the glenohumeral joint on the left side.  He can make an appointment to see me anywhere from 2 to 4 weeks after the injection.

## 2019-06-13 ENCOUNTER — Ambulatory Visit (INDEPENDENT_AMBULATORY_CARE_PROVIDER_SITE_OTHER): Payer: Medicare Other | Admitting: Podiatry

## 2019-06-13 ENCOUNTER — Encounter: Payer: Self-pay | Admitting: Podiatry

## 2019-06-13 ENCOUNTER — Other Ambulatory Visit: Payer: Self-pay

## 2019-06-13 DIAGNOSIS — L608 Other nail disorders: Secondary | ICD-10-CM

## 2019-06-13 DIAGNOSIS — M79674 Pain in right toe(s): Secondary | ICD-10-CM | POA: Diagnosis not present

## 2019-06-13 DIAGNOSIS — B351 Tinea unguium: Secondary | ICD-10-CM | POA: Diagnosis not present

## 2019-06-13 DIAGNOSIS — M79675 Pain in left toe(s): Secondary | ICD-10-CM

## 2019-06-13 NOTE — Progress Notes (Signed)
Complaint:  Visit Type: Patient returns to my office for continued preventative foot care services. Complaint: Patient states" my nails have grown long and thick and become painful to walk and wear shoes"  The patient presents for preventative foot care services. No changes to ROS  Podiatric Exam: Vascular: dorsalis pedis and posterior tibial pulses are palpable bilateral. Capillary return is immediate. Temperature gradient is WNL. Skin turgor WNL  Sensorium: Normal Semmes Weinstein monofilament test. Normal tactile sensation bilaterally. Nail Exam: Pt has thick disfigured discolored nails with subungual debris noted bilateral entire nail halluxes. Ulcer Exam: There is no evidence of ulcer or pre-ulcerative changes or infection. Orthopedic Exam: Muscle tone and strength are WNL. No limitations in general ROM. No crepitus or effusions noted. Foot type and digits show no abnormalities. Bony prominences are unremarkable. Skin: No Porokeratosis. No infection or ulcers  Diagnosis:  Onychomycosis, , Pain in right toe, pain in left toes  Treatment & Plan Procedures and Treatment: Consent by patient was obtained for treatment procedures.   Debridement of mycotic and hypertrophic toenails, 1 through 5 bilateral and clearing of subungual debris. No ulceration, no infection noted.  Return Visit-Office Procedure: Patient instructed to return to the office for a follow up visit 10 weeks  for continued evaluation and treatment.    Tehani Mersman DPM 

## 2019-06-27 ENCOUNTER — Encounter: Payer: Medicare Other | Admitting: Physical Medicine and Rehabilitation

## 2019-07-03 ENCOUNTER — Ambulatory Visit (INDEPENDENT_AMBULATORY_CARE_PROVIDER_SITE_OTHER): Payer: Medicare Other | Admitting: Physical Medicine and Rehabilitation

## 2019-07-03 ENCOUNTER — Ambulatory Visit: Payer: Self-pay

## 2019-07-03 ENCOUNTER — Encounter: Payer: Self-pay | Admitting: Physical Medicine and Rehabilitation

## 2019-07-03 VITALS — BP 148/81 | HR 86

## 2019-07-03 DIAGNOSIS — G8929 Other chronic pain: Secondary | ICD-10-CM

## 2019-07-03 DIAGNOSIS — M5416 Radiculopathy, lumbar region: Secondary | ICD-10-CM | POA: Diagnosis not present

## 2019-07-03 DIAGNOSIS — M25512 Pain in left shoulder: Secondary | ICD-10-CM | POA: Diagnosis not present

## 2019-07-03 MED ORDER — BETAMETHASONE SOD PHOS & ACET 6 (3-3) MG/ML IJ SUSP: 12.0000 mg | Freq: Once | INTRAMUSCULAR | Status: AC

## 2019-07-03 NOTE — Progress Notes (Signed)
 .  Numeric Pain Rating Scale and Functional Assessment Average Pain 5   In the last MONTH (on 0-10 scale) has pain interfered with the following?  1. General activity like being  able to carry out your everyday physical activities such as walking, climbing stairs, carrying groceries, or moving a chair?  Rating(6)   +Driver, -BT, -Dye Allergies.  

## 2019-07-03 NOTE — Progress Notes (Signed)
Frank Johnston - 78 y.o. male MRN [id number]  Date of birth: [birth year]  Office Visit Note: Visit Date: 07/03/2019 PCP: Leanna Battles, MD Referred by: Leanna Battles, MD  Subjective: Chief Complaint  Patient presents with   Lower Back - Pain   Right Leg - Pain   Left Shoulder - Pain   HPI:  Frank Johnston is a 78 y.o. male who comes in today At the request of Dr. Jean Rosenthal for repeat right L5 transforaminal steroid injection was completed back in January with really good relief up until patient had a fall.  He actually had a L1 compression fracture but his pain really is not consistent with that.  There is no retropulsion.  He also has left shoulder arthritis and severe left shoulder pain and Dr. Ninfa Linden requested [place] to complete intra-articular injection fluoroscopic guidance as well.  Both of these were done today.  Patient will follow-up with Dr. Ninfa Linden.  ROS Otherwise per HPI.  Assessment & Plan: Visit Diagnoses:  1. Lumbar radiculopathy   2. Chronic left shoulder pain     Plan: No additional findings.   Meds & Orders:  Meds ordered this encounter  Medications   betamethasone acetate-betamethasone sodium phosphate (CELESTONE) injection 12 mg    Orders Placed This Encounter  Procedures   Large Joint Inj: L glenohumeral   XR C-ARM NO REPORT   Epidural Steroid injection    Follow-up: Return for Jean Rosenthal, MD as needed.   Procedures: Large Joint Inj: L glenohumeral on 07/03/2019 2:08 PM Indications: pain and diagnostic evaluation Details: 22 G 3.5 in needle, fluoroscopy-guided anteromedial approach  Arthrogram: No  Medications: 3 mL bupivacaine 0.5 %; 40 mg triamcinolone acetonide 40 MG/ML Outcome: tolerated well, no immediate complications  There was excellent flow of contrast producing a partial arthrogram of the glenohumeral joint. The patient did have relief of symptoms during the anesthetic phase of the  injection. Procedure, treatment alternatives, risks and benefits explained, specific risks discussed. Consent was given by the patient. Immediately prior to procedure a time out was called to verify the correct patient, procedure, equipment, support staff and site/side marked as required. Patient was prepped and draped in the usual sterile fashion.      Lumbosacral Transforaminal Epidural Steroid Injection - Sub-Pedicular Approach with Fluoroscopic Guidance  Patient: Frank Johnston      Date of Birth: October 17, 1940 MRN: [id number] PCP: Leanna Battles, MD      Visit Date: 07/03/2019   Universal Protocol:    Date/Time: 07/03/2019  Consent Given By: the patient  Position: PRONE  Additional Comments: Vital signs were monitored before and after the procedure. Patient was prepped and draped in the usual sterile fashion. The correct patient, procedure, and site was verified.   Injection Procedure Details:  Procedure Site One Meds Administered:  Meds ordered this encounter  Medications   betamethasone acetate-betamethasone sodium phosphate (CELESTONE) injection 12 mg    Laterality: Right  Location/Site:  L5-S1  Needle size: 22 G  Needle type: Spinal  Needle Placement: Transforaminal  Findings:    -Comments: Excellent flow of contrast along the nerve and into the epidural space.  Procedure Details: After squaring off the end-plates to get a Frank AP view, the C-arm was positioned so that an oblique view of the foramen as noted above was visualized. The target area is just inferior to the "nose of the scotty dog" or sub pedicular. The soft tissues overlying this structure were infiltrated with 2-3 ml. of 1%  Lidocaine without Epinephrine.  The spinal needle was inserted toward the target using a "trajectory" view along the fluoroscope beam.  Under AP and lateral visualization, the needle was advanced so it did not puncture dura and was located close the 6 O'Clock position of  the pedical in AP tracterory. Biplanar projections were used to confirm position. Aspiration was confirmed to be negative for CSF and/or blood. A 1-2 ml. volume of Isovue-250 was injected and flow of contrast was noted at each level. Radiographs were obtained for documentation purposes.   After attaining the desired flow of contrast documented above, a 0.5 to 1.0 ml test dose of 0.25% Marcaine was injected into each respective transforaminal space.  The patient was observed for 90 seconds post injection.  After no sensory deficits were reported, and normal lower extremity motor function was noted,   the above injectate was administered so that equal amounts of the injectate were placed at each foramen (level) into the transforaminal epidural space.   Additional Comments:  The patient tolerated the procedure well Dressing: 2 x 2 sterile gauze and Band-Aid    Post-procedure details: Patient was observed during the procedure. Post-procedure instructions were reviewed.  Patient left the clinic in stable condition.     Clinical History: MRI LUMBAR SPINE WITHOUT AND WITH CONTRAST  TECHNIQUE: Multiplanar and multiecho pulse sequences of the lumbar spine were obtained without and with intravenous contrast.  CONTRAST:  87mL MULTIHANCE GADOBENATE DIMEGLUMINE 529 MG/ML IV SOLN  COMPARISON:  Lumbar MRI 07/15/2014 and earlier.  FINDINGS: Segmentation: Normal on a 2008 CT Abdomen and Pelvis. There is a vestigial S1-S2 disc space. This is the same numbering system used on the prior MRI.  Alignment: Stable since 2008. Chronic mild grade 1 anterolisthesis of L4 on L5 and similar mild retrolisthesis of L5 on S1.  Vertebrae: No acute osseous abnormality aside for trace degenerative endplate marrow edema and enhancement anteriorly superiorly at L1 (series 3, image 6). Normal background bone marrow signal. Intact visible sacrum and SI joints.  Conus medullaris and cauda equina: Conus  extends to the L1-L2 level. No lower spinal cord or conus signal abnormality. No abnormal intradural enhancement. No dural thickening.  Paraspinal and other soft tissues: Chronic renal cysts are partially visible and appear benign. Otherwise negative.  Disc levels:  T11-T12: Mild facet hypertrophy.  No stenosis.  T12-L1:  Negative.  L1-L2:  Mild facet hypertrophy.  No stenosis.  L2-L3: Minor circumferential disc bulge. Mild facet and ligament flavum hypertrophy. No stenosis.  L3-L4: Mild mostly far lateral disc bulging. Mild facet hypertrophy on the right. No stenosis.  L4-L5: Mild chronic anterolisthesis and circumferential disc bulge with broad-based posterior component. Moderate facet hypertrophy. Resolved degenerative facet joint fluid since 2015. Stable mild spinal stenosis. No convincing lateral recess or foraminal stenosis.  L5-S1: Chronic mild retrolisthesis with circumferential disc osteophyte complex. Previous right laminectomy. Mild to moderate residual facet hypertrophy. No spinal or lateral recess stenosis. Mild left and mild to moderate right L5 foraminal stenosis is stable.  IMPRESSION: 1. Stable MRI appearance of the lumbar spine since 2015. No new or progressed neural impingement. 2. Mild chronic spondylolisthesis at L4-L5 and L5-S1, with remote prior surgery at the latter. Chronic mild spinal stenosis at L4-L5, and mild to moderate foraminal stenosis at L5-S1.   Electronically Signed   By: Genevie Ann M.D.   On: 10/19/2018 20:28     Objective:  VS:  HT:     WT:    BMI:  BP:(!) 148/81   HR:86bpm   TEMP: ( )   RESP:  Physical Exam  Ortho Exam Imaging: No results found.

## 2019-07-08 MED ORDER — TRIAMCINOLONE ACETONIDE 40 MG/ML IJ SUSP
40.0000 mg | INTRAMUSCULAR | Status: AC | PRN
  Administered 2019-07-03: 40 mg via INTRA_ARTICULAR

## 2019-07-08 MED ORDER — BUPIVACAINE HCL 0.5 % IJ SOLN
3.0000 mL | INTRAMUSCULAR | Status: AC | PRN
  Administered 2019-07-03: 14:00:00 3 mL via INTRA_ARTICULAR

## 2019-07-08 NOTE — Procedures (Signed)
Lumbosacral Transforaminal Epidural Steroid Injection - Sub-Pedicular Approach with Fluoroscopic Guidance  Patient: Frank Johnston      Date of Birth: 1941-09-01 MRN: [id number] PCP: Leanna Battles, MD      Visit Date: 07/03/2019   Universal Protocol:    Date/Time: 07/03/2019  Consent Given By: the patient  Position: PRONE  Additional Comments: Vital signs were monitored before and after the procedure. Patient was prepped and draped in the usual sterile fashion. The correct patient, procedure, and site was verified.   Injection Procedure Details:  Procedure Site One Meds Administered:  Meds ordered this encounter  Medications  . betamethasone acetate-betamethasone sodium phosphate (CELESTONE) injection 12 mg    Laterality: Right  Location/Site:  L5-S1  Needle size: 22 G  Needle type: Spinal  Needle Placement: Transforaminal  Findings:    -Comments: Excellent flow of contrast along the nerve and into the epidural space.  Procedure Details: After squaring off the end-plates to get a true AP view, the C-arm was positioned so that an oblique view of the foramen as noted above was visualized. The target area is just inferior to the "nose of the scotty dog" or sub pedicular. The soft tissues overlying this structure were infiltrated with 2-3 ml. of 1% Lidocaine without Epinephrine.  The spinal needle was inserted toward the target using a "trajectory" view along the fluoroscope beam.  Under AP and lateral visualization, the needle was advanced so it did not puncture dura and was located close the 6 O'Clock position of the pedical in AP tracterory. Biplanar projections were used to confirm position. Aspiration was confirmed to be negative for CSF and/or blood. A 1-2 ml. volume of Isovue-250 was injected and flow of contrast was noted at each level. Radiographs were obtained for documentation purposes.   After attaining the desired flow of contrast documented above, a 0.5  to 1.0 ml test dose of 0.25% Marcaine was injected into each respective transforaminal space.  The patient was observed for 90 seconds post injection.  After no sensory deficits were reported, and normal lower extremity motor function was noted,   the above injectate was administered so that equal amounts of the injectate were placed at each foramen (level) into the transforaminal epidural space.   Additional Comments:  The patient tolerated the procedure well Dressing: 2 x 2 sterile gauze and Band-Aid    Post-procedure details: Patient was observed during the procedure. Post-procedure instructions were reviewed.  Patient left the clinic in stable condition.

## 2019-08-14 ENCOUNTER — Other Ambulatory Visit: Payer: Self-pay | Admitting: Cardiovascular Disease

## 2019-08-22 ENCOUNTER — Ambulatory Visit (INDEPENDENT_AMBULATORY_CARE_PROVIDER_SITE_OTHER): Payer: Medicare Other | Admitting: Podiatry

## 2019-08-22 ENCOUNTER — Other Ambulatory Visit: Payer: Self-pay

## 2019-08-22 ENCOUNTER — Encounter: Payer: Self-pay | Admitting: Podiatry

## 2019-08-22 DIAGNOSIS — M79674 Pain in right toe(s): Secondary | ICD-10-CM | POA: Diagnosis not present

## 2019-08-22 DIAGNOSIS — M79675 Pain in left toe(s): Secondary | ICD-10-CM

## 2019-08-22 DIAGNOSIS — B351 Tinea unguium: Secondary | ICD-10-CM

## 2019-08-22 DIAGNOSIS — L600 Ingrowing nail: Secondary | ICD-10-CM

## 2019-08-22 DIAGNOSIS — L608 Other nail disorders: Secondary | ICD-10-CM

## 2019-08-22 NOTE — Progress Notes (Signed)
Complaint:  Visit Type: Patient returns to my office for continued preventative foot care services. Complaint: Patient states" my nails have grown long and thick and become painful to walk and wear shoes"  The patient presents for preventative foot care services. No changes to ROS  Podiatric Exam: Vascular: dorsalis pedis and posterior tibial pulses are palpable bilateral. Capillary return is immediate. Temperature gradient is WNL. Skin turgor WNL  Sensorium: Normal Semmes Weinstein monofilament test. Normal tactile sensation bilaterally. Nail Exam: Pt has thick disfigured discolored nails with subungual debris noted bilateral entire nail halluxes.  Incurvation lateral border left and incurvation lateral border right. Ulcer Exam: There is no evidence of ulcer or pre-ulcerative changes or infection. Orthopedic Exam: Muscle tone and strength are WNL. No limitations in general ROM. No crepitus or effusions noted. Foot type and digits show no abnormalities. Bony prominences are unremarkable. Skin: No Porokeratosis. No infection or ulcers  Diagnosis:  Onychomycosis, , Pain in right toe, pain in left toes  Treatment & Plan Procedures and Treatment: Consent by patient was obtained for treatment procedures.   Debridement of mycotic and hypertrophic toenails, 1 through 5 bilateral and clearing of subungual debris. No ulceration, no infection noted.  Return Visit-Office Procedure: Patient instructed to return to the office for a follow up visit 10 weeks  for continued evaluation and treatment.    Gardiner Barefoot DPM

## 2019-08-26 DIAGNOSIS — L812 Freckles: Secondary | ICD-10-CM | POA: Diagnosis not present

## 2019-08-26 DIAGNOSIS — Z85828 Personal history of other malignant neoplasm of skin: Secondary | ICD-10-CM | POA: Diagnosis not present

## 2019-08-26 DIAGNOSIS — L570 Actinic keratosis: Secondary | ICD-10-CM | POA: Diagnosis not present

## 2019-08-26 DIAGNOSIS — L821 Other seborrheic keratosis: Secondary | ICD-10-CM | POA: Diagnosis not present

## 2019-08-26 DIAGNOSIS — D692 Other nonthrombocytopenic purpura: Secondary | ICD-10-CM | POA: Diagnosis not present

## 2019-08-26 DIAGNOSIS — D225 Melanocytic nevi of trunk: Secondary | ICD-10-CM | POA: Diagnosis not present

## 2019-08-26 DIAGNOSIS — L820 Inflamed seborrheic keratosis: Secondary | ICD-10-CM | POA: Diagnosis not present

## 2019-09-19 ENCOUNTER — Other Ambulatory Visit: Payer: Self-pay

## 2019-09-19 ENCOUNTER — Encounter: Payer: Self-pay | Admitting: Cardiovascular Disease

## 2019-09-19 ENCOUNTER — Ambulatory Visit (INDEPENDENT_AMBULATORY_CARE_PROVIDER_SITE_OTHER): Payer: Medicare Other | Admitting: Cardiovascular Disease

## 2019-09-19 VITALS — BP 141/82 | HR 71 | Temp 97.9°F | Ht 68.0 in | Wt 190.0 lb

## 2019-09-19 DIAGNOSIS — E7800 Pure hypercholesterolemia, unspecified: Secondary | ICD-10-CM | POA: Diagnosis not present

## 2019-09-19 DIAGNOSIS — I10 Essential (primary) hypertension: Secondary | ICD-10-CM

## 2019-09-19 NOTE — Patient Instructions (Signed)
Medication Instructions:  Your physician recommends that you continue on your current medications as directed. Please refer to the Current Medication list given to you today.  *If you need a refill on your cardiac medications before your next appointment, please call your pharmacy*  Lab Work: NONE  Testing/Procedures: NONE   Follow-Up: Your physician wants you to follow-up in: Frank Johnston will receive a reminder letter in the mail two months in advance. If you don't receive a letter, please call our office to schedule the follow-up appointment.  Your physician recommends that you schedule a follow-up appointment in: PHARM D IN ABOUT 2 MONTHS FOR BLOOD PRESSURE   Other Instructions   MONITOR AND LOG YOUR BLOOD PRESSURE BRING READINGS TO YOUR FOLLOW UP WITH PHARM D

## 2019-09-19 NOTE — Progress Notes (Signed)
Cardiology Office Note   Date:  09/19/2019   ID:  Frank Johnston, DOB 07/27/1941, MRN [id number]  PCP:  Leanna Battles, MD  Cardiologist:   Skeet Latch, MD   No chief complaint on file.   Patient ID: Frank Johnston is a 78 y.o. male with hypertension and prior pulmonary embolism who presents for follow up.  Frank Johnston was initially seen 09/2015 for dyspnea on exertion.  He initially saw his PCP, Dr. [name] 08/2015. He reported dyspnea with minimal exertion. He was noted to have trace edema on exam.  He was referred for CXR that showed mild hyperexpansion but no active disease.  BNP was 17. D-dimer was elevated at 1.6.  He was referred for CT angiography of the chest that was negative for pulmonary embolus. The prior left lower lobe pulmonary embolism had resolved and his pulmonary arteries were normal in size.  No coronary calcifications were noted.  He was referred for [facility] 11/2015 that revealed a mild fixed defect in the apical and inferoseptal regions. There was no ischemia identified he had a normal ejection fraction.  He also had an echo that revealed an EF of 55-60% without wall motion abnormalities.  He was noted to have grade 1 diastolic dysfunction and mild mitral regurgitation.  Frank Johnston was referred to pulmonology where he underwent CPX testing. Ultimately his dyspnea was thought to be due to diastolic dysfunction, obesity, and deconditioning.  Verapamil was switched to amlodipine due to poorly controlled blood pressure.  It was then discontinued 2/2 edema.   Since his last appointment Frank Johnston has been well from a cardiac standpoint.  He fell in May and had a lumbar compression fracture.  He also injured his shoulers. He hasn't experienced any chest pain and his breathing has been stable.  He denies lower extremity edema, orthopnea or PND.  He gets some exercise with walking and has no exertional symptoms.   Past Medical History:   Diagnosis Date  . Arthritis   . Chest pain 10/02/2015  . Chronic kidney disease    hx kidney stones  . GERD (gastroesophageal reflux disease)   . H/O hiatal hernia   . Hypertension   . Wears glasses     Past Surgical History:  Procedure Laterality Date  . [place] VITRECTOMY WITH 20 GAUGE MVR PORT Right 09/06/2016   Procedure: 25 GAUGE PARS PLANA VITRECTOMY WITH 20 GAUGE MVR PORT;  Surgeon: [name], MD;  Location: [place];  Service: Ophthalmology;  Laterality: Right;  . APPENDECTOMY  1976  . BACK SURGERY  1984   lumbar lam  . CARPAL TUNNEL RELEASE  12/06/2011   Procedure: CARPAL TUNNEL RELEASE;  Surgeon: Wynonia Sours, MD;  Location: [place];  Service: Orthopedics;  Laterality: Right;  . CARPAL TUNNEL RELEASE  08/07/2012   Procedure: CARPAL TUNNEL RELEASE;  Surgeon: Wynonia Sours, MD;  Location: [place];  Service: Orthopedics;  Laterality: Left;  carpal tunnel release  . CHOLECYSTECTOMY  1976  . COLONOSCOPY    . JOINT REPLACEMENT  4/12   lt total knee  . KNEE ARTHROSCOPY     rt and lt  . KNEE CLOSED REDUCTION  6/12   lt -after lt total knee  . TRIGGER FINGER RELEASE  12/06/2011   Procedure: RELEASE TRIGGER FINGER/A-1 PULLEY;  Surgeon: Wynonia Sours, MD;  Location: [place];  Service: Orthopedics;  Laterality: Right;  right thumb,  middle, and ring fingers  . TRIGGER FINGER RELEASE  08/07/2012   Procedure: RELEASE TRIGGER FINGER/A-1 PULLEY;  Surgeon: Wynonia Sours, MD;  Location: [place];  Service: Orthopedics;  Laterality: Left;  trigger release left ring finger     Current Outpatient Medications  Medication Sig Dispense Refill  . acetaminophen (TYLENOL) 500 MG tablet Take 1,000 mg by mouth daily as needed for moderate pain or headache.    Marland Kitchen amoxicillin (AMOXIL) 500 MG tablet TAKE FOUR TABLETS BY MOUTH 1 HOUR PTA    . aspirin 81 MG tablet Take 81 mg by mouth every evening.     . fluticasone  (FLONASE) 50 MCG/ACT nasal spray Place 2 sprays into the nose at bedtime as needed for allergies.     . hydrALAZINE (APRESOLINE) 50 MG tablet Take 1 tablet (50 mg total) by mouth 2 (two) times daily. 180 tablet 3  . HYDROcodone-acetaminophen (NORCO) 7.5-325 MG tablet Take 1 tablet by mouth every 6 (six) hours as needed for moderate pain. 30 tablet 0  . metoCLOPramide (REGLAN) 5 MG tablet Take 5 mg by mouth at bedtime.     . naproxen sodium (ALEVE) 220 MG tablet Take 220 mg by mouth daily as needed.    Marland Kitchen omeprazole (PRILOSEC) 40 MG capsule Take 40 mg by mouth every evening.    . Probiotic Product (PROBIOTIC ADVANCED PO) Take 1 capsule by mouth at bedtime.     Marland Kitchen therapeutic multivitamin-minerals (THERAGRAN-M) tablet Take 1 tablet by mouth daily.    . valsartan-hydrochlorothiazide (DIOVAN-HCT) 320-12.5 MG tablet TAKE 1 TABLET BY MOUTH  DAILY 90 tablet 0  . LACTOBACILLUS PO Advanced Probiotic    . methylPREDNISolone (MEDROL) 4 MG tablet Medrol dose pack. Take as instructed (Patient not taking: Reported on 09/19/2019) 21 tablet 0  . predniSONE (DELTASONE) 50 MG tablet Take 1 tablet daily with food for 5 days until finished (Patient not taking: Reported on 09/19/2019) 5 tablet 0  . vitamin E (VITAMIN E) 400 UNIT capsule Take 400 Units by mouth daily.     Current Facility-Administered Medications  Medication Dose Route Frequency Provider Last Rate Last Admin  . betamethasone acetate-betamethasone sodium phosphate (CELESTONE) injection 12 mg  12 mg Other Once Magnus Sinning, MD        Allergies:   Amlodipine and No known allergies    Social History:  The patient  reports that he quit smoking about 24 years ago. His smoking use included cigarettes. He has a 40.00 pack-year smoking history. He has never used smokeless tobacco. He reports that he does not drink alcohol or use drugs.   Family History:  The patient's family history includes Cancer in his father; Hypertension in his child and child;  Stroke in his mother.    ROS:  Please see the history of present illness.   Otherwise, review of systems are positive for none.   All other systems are reviewed and negative.    PHYSICAL EXAM:  EKG:  EKG is ordered today. 01/30/18: Sinus rhythm.  Rate 69 bpm.  First degree AV block.  08/06/18: Sinus rhythm.  Rate 84 bpm.  LAFB.    Recent Labs: 02/17/2019: ALT 23; BUN 26; Creatinine, Ser 1.52; Hemoglobin 14.6; Platelets 196; Potassium 3.8; Sodium 137   08/15/15: BNP 17.2  D-dimer 1.6 (normal 0-0.49) WBC 10.2, hemoglobin 16.3, hematocrit 46.3, platelets 225 Sodium 140, Potassium 3.8, BUN 20, creatinine 1.1, glucose 112  Echo 11/16/15: Study Conclusions  - Left ventricle: The cavity size was normal.  Wall thickness was   increased in a pattern of mild LVH. Systolic function was normal.   The estimated ejection fraction was in the range of 55% to 60%.   Wall motion was normal; there were no regional wall motion   abnormalities. Doppler parameters are consistent with abnormal   left ventricular relaxation (grade 1 diastolic dysfunction). - Mitral valve: There was mild regurgitation.   Lipid Panel No results found for: CHOL, TRIG, HDL, CHOLHDL, VLDL, LDLCALC, LDLDIRECT   12/15/2017: Total cholesterol 170, LDL, HDL 59 BUN 24, creatinine 1.3 AST 19, ALT 21   Wt Readings from Last 3 Encounters:  09/19/19 190 lb (86.2 kg)  02/17/19 195 lb (88.5 kg)  11/13/18 200 lb (90.7 kg)      ASSESSMENT AND PLAN:  # Hypertension: Blood pressure was elevated here but has been controlled at home.  Continue hydralazine, valsartan and HCTZ.   Track BP at home and bring log and machine to follow up.  # CV Disease prevention: Lipids followed by Dr. Philip Aspen.    Current medicines are reviewed at length with the patient today.  The patient does not have concerns regarding medicines.  The following changes have been made:  no change  Labs/ tests ordered today include:   Orders Placed This  Encounter  Procedures  . EKG 12-Lead     Disposition:   FU with Merrell Borsuk C. Oval Linsey, MD, [facility] in 1 year.  PharmD in 2 months.    Signed, Monifah Freehling C. Oval Linsey, MD, [facility]  09/19/2019 6:07 PM    [place]   Cardiology Office Note   Date:  09/19/2019   ID:  Kaine, Gillyard Jan 06, 1941, MRN [id number]  PCP:  Leanna Battles, MD  Cardiologist:   Skeet Latch, MD   No chief complaint on file.   Patient ID: ABDUR NITCHER is a 78 y.o. male with hypertension and prior pulmonary embolism who presents for follow up.  Frank Johnston was initially seen 09/2015 for dyspnea on exertion.  He initially saw his PCP, Dr. [name] 08/2015. He reported dyspnea with minimal exertion. He was noted to have trace edema on exam.  He was referred for CXR that showed mild hyperexpansion but no active disease.  BNP was 17. D-dimer was elevated at 1.6.  He was referred for CT angiography of the chest that was negative for pulmonary embolus The prior left lower lobe pulmonary embolism had resolved and his pulmonary arteries were normal in size.  No coronary calcifications were noted.  He was referred for [facility] 11/2015 that revealed a mild fixed defect in the apical and inferoseptal regions. There was no ischemia identified he had a normal ejection fraction.  He also had an echo that revealed an EF of 55-60% without wall motion abnormalities.  He was noted to have grade 1 diastolic dysfunction and mild mitral regurgitation.  Frank Johnston was referred to pulmonology where he underwent CPX testing. Ultimately his dyspnea was thought to be due to diastolic dysfunction, obesity, and deconditioning.  At his last appointment verapamil was switched to amlodipine due to poorly controlled blood pressure.  He followed up with our pharmacist 03/2018 and his blood pressure was well have been controlled.  He had LE edema on amlodipine so it was switched to spironolactone.   Since that time his BP has been well-controlled and he feels well.  He has no more edema.  He doesn't get much formal exercise due to knee pain.  He does  yard work and walks some.  Frank Johnston denies exertional chest pain or shortness of breath.  He has no orthopnea or PND.  His mother in law recently had two strokes so he and his wife have been busy caring for her.  BP high lately Creatinine elevated  [place] 02/2019 Compression fracture, injured shoulders Lost 25 lb Diet has been good  He walks for exercise a couple times per week- 30 min Feels good No palpitations  BP sometimes low at home and he feels dizzy BP 100/59 to 140/68  On average 120s/60-70s  F/u 2 month     Past Medical History:  Diagnosis Date  . Arthritis   . Chest pain 10/02/2015  . Chronic kidney disease    hx kidney stones  . GERD (gastroesophageal reflux disease)   . H/O hiatal hernia   . Hypertension   . Wears glasses     Past Surgical History:  Procedure Laterality Date  . [place] VITRECTOMY WITH 20 GAUGE MVR PORT Right 09/06/2016   Procedure: 25 GAUGE PARS PLANA VITRECTOMY WITH 20 GAUGE MVR PORT;  Surgeon: [name], MD;  Location: [place];  Service: Ophthalmology;  Laterality: Right;  . APPENDECTOMY  1976  . BACK SURGERY  1984   lumbar lam  . CARPAL TUNNEL RELEASE  12/06/2011   Procedure: CARPAL TUNNEL RELEASE;  Surgeon: Wynonia Sours, MD;  Location: [place];  Service: Orthopedics;  Laterality: Right;  . CARPAL TUNNEL RELEASE  08/07/2012   Procedure: CARPAL TUNNEL RELEASE;  Surgeon: Wynonia Sours, MD;  Location: [place];  Service: Orthopedics;  Laterality: Left;  carpal tunnel release  . CHOLECYSTECTOMY  1976  . COLONOSCOPY    . JOINT REPLACEMENT  4/12   lt total knee  . KNEE ARTHROSCOPY     rt and lt  . KNEE CLOSED REDUCTION  6/12   lt -after lt total knee  . TRIGGER FINGER RELEASE  12/06/2011   Procedure: RELEASE TRIGGER FINGER/A-1 PULLEY;   Surgeon: Wynonia Sours, MD;  Location: [place];  Service: Orthopedics;  Laterality: Right;  right thumb, middle, and ring fingers  . TRIGGER FINGER RELEASE  08/07/2012   Procedure: RELEASE TRIGGER FINGER/A-1 PULLEY;  Surgeon: Wynonia Sours, MD;  Location: [place];  Service: Orthopedics;  Laterality: Left;  trigger release left ring finger     Current Outpatient Medications  Medication Sig Dispense Refill  . acetaminophen (TYLENOL) 500 MG tablet Take 1,000 mg by mouth daily as needed for moderate pain or headache.    Marland Kitchen amoxicillin (AMOXIL) 500 MG tablet TAKE FOUR TABLETS BY MOUTH 1 HOUR PTA    . aspirin 81 MG tablet Take 81 mg by mouth every evening.     . fluticasone (FLONASE) 50 MCG/ACT nasal spray Place 2 sprays into the nose at bedtime as needed for allergies.     . hydrALAZINE (APRESOLINE) 50 MG tablet Take 1 tablet (50 mg total) by mouth 2 (two) times daily. 180 tablet 3  . HYDROcodone-acetaminophen (NORCO) 7.5-325 MG tablet Take 1 tablet by mouth every 6 (six) hours as needed for moderate pain. 30 tablet 0  . metoCLOPramide (REGLAN) 5 MG tablet Take 5 mg by mouth at bedtime.     . naproxen sodium (ALEVE) 220 MG tablet Take 220 mg by mouth daily as needed.    Marland Kitchen omeprazole (PRILOSEC) 40 MG capsule Take 40 mg by mouth every evening.    Marland Kitchen  Probiotic Product (PROBIOTIC ADVANCED PO) Take 1 capsule by mouth at bedtime.     Marland Kitchen therapeutic multivitamin-minerals (THERAGRAN-M) tablet Take 1 tablet by mouth daily.    . valsartan-hydrochlorothiazide (DIOVAN-HCT) 320-12.5 MG tablet TAKE 1 TABLET BY MOUTH  DAILY 90 tablet 0  . LACTOBACILLUS PO Advanced Probiotic    . methylPREDNISolone (MEDROL) 4 MG tablet Medrol dose pack. Take as instructed (Patient not taking: Reported on 09/19/2019) 21 tablet 0  . predniSONE (DELTASONE) 50 MG tablet Take 1 tablet daily with food for 5 days until finished (Patient not taking: Reported on 09/19/2019) 5 tablet 0  . vitamin E (VITAMIN E)  400 UNIT capsule Take 400 Units by mouth daily.     Current Facility-Administered Medications  Medication Dose Route Frequency Provider Last Rate Last Admin  . betamethasone acetate-betamethasone sodium phosphate (CELESTONE) injection 12 mg  12 mg Other Once Magnus Sinning, MD        Allergies:   Amlodipine and No known allergies    Social History:  The patient  reports that he quit smoking about 24 years ago. His smoking use included cigarettes. He has a 40.00 pack-year smoking history. He has never used smokeless tobacco. He reports that he does not drink alcohol or use drugs.   Family History:  The patient's family history includes Cancer in his father; Hypertension in his child and child; Stroke in his mother.    ROS:  Please see the history of present illness.   Otherwise, review of systems are positive for none.   All other systems are reviewed and negative.    PHYSICAL EXAM:  EKG:  EKG is ordered today. 01/30/18: Sinus rhythm.  Rate 69 bpm.  First degree AV block.  08/06/18: Sinus rhythm.  Rate 84 bpm.  LAFB.   09/19/19: Sinus rhythm.  Rate 71 bpm.  First-degree AV block  Recent Labs: 02/17/2019: ALT 23; BUN 26; Creatinine, Ser 1.52; Hemoglobin 14.6; Platelets 196; Potassium 3.8; Sodium 137   08/15/15: BNP 17.2  D-dimer 1.6 (normal 0-0.49) WBC 10.2, hemoglobin 16.3, hematocrit 46.3, platelets 225  sodium 140, Potassium 3.8, BUN 20, creatinine 1.1, glucose 112  Echo 11/16/15: Study Conclusions  - Left ventricle: The cavity size was normal. Wall thickness was   increased in a pattern of mild LVH. Systolic function was normal.   The estimated ejection fraction was in the range of 55% to 60%.   Wall motion was normal; there were no regional wall motion   abnormalities. Doppler parameters are consistent with abnormal   left ventricular relaxation (grade 1 diastolic dysfunction). - Mitral valve: There was mild regurgitation.   Lipid Panel No results found for: CHOL,  TRIG, HDL, CHOLHDL, VLDL, LDLCALC, LDLDIRECT   12/15/2017: Total cholesterol 170, LDL, HDL 59 BUN 24, creatinine 1.3 AST 19, ALT 21   Wt Readings from Last 3 Encounters:  09/19/19 190 lb (86.2 kg)  02/17/19 195 lb (88.5 kg)  11/13/18 200 lb (90.7 kg)      ASSESSMENT AND PLAN:  # Hypertension: Blood pressure is well-controlled.  However his renal function has been worse.  This could be related to valsartan, HCTZ, spironolactone, or Sulindac.  We will repeat his BMP and reassess.   # CV Disease prevention: Lipids followed by Dr. Philip Aspen.    Current medicines are reviewed at length with the patient today.  The patient does not have concerns regarding medicines.  The following changes have been made:  no change  Labs/ tests ordered today include:   Orders  Placed This Encounter  Procedures  . EKG 12-Lead     Disposition:   FU with Kersti Scavone C. Oval Linsey, MD, [facility] in 1 year.   Signed, Trestan Vahle C. Oval Linsey, MD, [facility]  09/19/2019 6:07 PM    [place]

## 2019-10-31 ENCOUNTER — Encounter: Payer: Self-pay | Admitting: Podiatry

## 2019-10-31 ENCOUNTER — Ambulatory Visit (INDEPENDENT_AMBULATORY_CARE_PROVIDER_SITE_OTHER): Payer: Medicare Other | Admitting: Podiatry

## 2019-10-31 ENCOUNTER — Other Ambulatory Visit: Payer: Self-pay

## 2019-10-31 DIAGNOSIS — M79675 Pain in left toe(s): Secondary | ICD-10-CM

## 2019-10-31 DIAGNOSIS — M79674 Pain in right toe(s): Secondary | ICD-10-CM | POA: Diagnosis not present

## 2019-10-31 DIAGNOSIS — L608 Other nail disorders: Secondary | ICD-10-CM

## 2019-10-31 DIAGNOSIS — B351 Tinea unguium: Secondary | ICD-10-CM | POA: Diagnosis not present

## 2019-10-31 NOTE — Progress Notes (Signed)
Complaint:  Visit Type: Patient returns to my office for continued preventative foot care services. Complaint: Patient states" my nails have grown long and thick and become painful to walk and wear shoes"  The patient presents for preventative foot care services. No changes to ROS  Podiatric Exam: Vascular: dorsalis pedis and posterior tibial pulses are palpable bilateral. Capillary return is immediate. Temperature gradient is WNL. Skin turgor WNL  Sensorium: Normal Semmes Weinstein monofilament test. Normal tactile sensation bilaterally. Nail Exam: Pt has thick disfigured discolored nails with subungual debris noted bilateral entire nail halluxes.  Incurvation lateral border left and incurvation lateral border right. Ulcer Exam: There is no evidence of ulcer or pre-ulcerative changes or infection. Orthopedic Exam: Muscle tone and strength are WNL. No limitations in general ROM. No crepitus or effusions noted. Foot type and digits show no abnormalities. Bony prominences are unremarkable. Skin: No Porokeratosis. No infection or ulcers  Diagnosis:  Onychomycosis, , Pain in right toe, pain in left toes  Treatment & Plan Procedures and Treatment: Consent by patient was obtained for treatment procedures.   Debridement of mycotic and hypertrophic toenails, 1 through 5 bilateral and clearing of subungual debris. No ulceration, no infection noted.  Return Visit-Office Procedure: Patient instructed to return to the office for a follow up visit 10 weeks  for continued evaluation and treatment.    Gardiner Barefoot DPM

## 2019-11-16 ENCOUNTER — Ambulatory Visit: Payer: Medicare Other | Attending: Internal Medicine

## 2019-11-16 DIAGNOSIS — Z23 Encounter for immunization: Secondary | ICD-10-CM | POA: Insufficient documentation

## 2019-11-16 NOTE — Progress Notes (Signed)
   Covid-19 Vaccination Clinic  Name:  Frank Johnston    MRN: [id number] DOB: 1941-06-04  11/16/2019  [name] was observed post Covid-19 immunization for 15 minutes without incidence. He was provided with Vaccine Information Sheet and instruction to access the V-Safe system.   [name] was instructed to call 911 with any severe reactions post vaccine: Marland Kitchen Difficulty breathing  . Swelling of your face and throat  . A fast heartbeat  . A bad rash all over your body  . Dizziness and weakness    Immunizations Administered    Name Date Dose VIS Date Route   Pfizer COVID-19 Vaccine 11/16/2019  9:14 AM 0.3 mL 09/13/2019 Intramuscular   Manufacturer: [place]   Lot: [id number]   [address]: [id number]

## 2019-11-20 ENCOUNTER — Other Ambulatory Visit: Payer: Self-pay

## 2019-11-20 ENCOUNTER — Ambulatory Visit (INDEPENDENT_AMBULATORY_CARE_PROVIDER_SITE_OTHER): Payer: Medicare Other | Admitting: Pharmacist

## 2019-11-20 VITALS — BP 128/64 | HR 83 | Resp 15 | Ht 68.0 in | Wt 192.0 lb

## 2019-11-20 DIAGNOSIS — I10 Essential (primary) hypertension: Secondary | ICD-10-CM

## 2019-11-20 MED ORDER — HYDRALAZINE HCL 50 MG PO TABS: ORAL_TABLET | ORAL | 3 refills | Status: DC

## 2019-11-20 NOTE — Progress Notes (Signed)
Patient ID: Frank Johnston                 DOB: 1941-03-07                      MRN: [id number]     HPI: Frank Johnston is a 79 y.o. male referred by Dr. Oval Linsey to HTN clinic. PMH includes hypertension, PE, DVT, arthritis, and chronic pain. Patient denies increased fatigue, dizziness, swelling or recurrent headaches. He reports chronic pain after fall and multiple fractures suffered 7 months ago. Patient was followed by our clinic until Feb/2020 when BP reached good control. During last OV with Dr Oval Linsey his BP was slightly above goal but his only reported change is increased pain.   Current HTN meds:  Hydralazine 50mg  BID Valsartan/HCT 320mg /12.5mg  daily  Previously tried:  Amlodipine - swelling Spironolactone 25mg  -replaced by HCT  BP goal: <130/80  Family History: The patient's family history includes Cancer in his father; Hypertension in his child and child; Stroke in his mother  Social History: The patient  reports that he quit smoking about 24 years ago. His smoking use included cigarettes. He has a 40.00 pack-year smoking history. He has never used smokeless tobacco. He reports that he does not drink alcohol or use drugs.   Diet: Recently has been eating out more as he and wife visit her mother in nursing facility most days.  previously was mostly home cooked meals, only adds salt to a few items; veggies are mostly fresh in spring/summer. Some canned in winter;  Plenty of chicken, some beef and pork, rare fish; eats some cracker, chips  Exercise: activities of daily living  Home BP readings:  7 readings provided; average 132/74 (HR range 68-74)   Wt Readings from Last 3 Encounters:  11/20/19 192 lb (87.1 kg)  09/19/19 190 lb (86.2 kg)  02/17/19 195 lb (88.5 kg)   BP Readings from Last 3 Encounters:  11/20/19 128/64  09/19/19 (!) 141/82  07/03/19 (!) 148/81   Pulse Readings from Last 3 Encounters:  11/20/19 83  09/19/19 71  07/03/19 86     Past Medical  History:  Diagnosis Date  . Arthritis   . Chest pain 10/02/2015  . Chronic kidney disease    hx kidney stones  . GERD (gastroesophageal reflux disease)   . H/O hiatal hernia   . Hypertension   . Wears glasses     Current Outpatient Medications on File Prior to Visit  Medication Sig Dispense Refill  . acetaminophen (TYLENOL) 500 MG tablet Take 1,000 mg by mouth daily as needed for moderate pain or headache.    Marland Kitchen amoxicillin (AMOXIL) 500 MG tablet TAKE FOUR TABLETS BY MOUTH 1 HOUR PTA    . aspirin 81 MG tablet Take 81 mg by mouth every evening.     . fluticasone (FLONASE) 50 MCG/ACT nasal spray Place 2 sprays into the nose at bedtime as needed for allergies.     Marland Kitchen HYDROcodone-acetaminophen (NORCO/VICODIN) 5-325 MG tablet     . ipratropium (ATROVENT) 0.06 % nasal spray SMARTSIG:1 Spray(s) Both Nares 3 Times Daily PRN    . metoCLOPramide (REGLAN) 5 MG tablet Take 5 mg by mouth at bedtime.     . naproxen sodium (ALEVE) 220 MG tablet Take 220 mg by mouth daily as needed.    Marland Kitchen omeprazole (PRILOSEC) 40 MG capsule Take 40 mg by mouth every evening.    . Probiotic Product (PROBIOTIC ADVANCED PO) Take 1 capsule  by mouth at bedtime.     . SENNA-S 8.6-50 MG tablet TK 1 T PO BID PRN    . therapeutic multivitamin-minerals (THERAGRAN-M) tablet Take 1 tablet by mouth daily.    . valsartan-hydrochlorothiazide (DIOVAN-HCT) 320-12.5 MG tablet TAKE 1 TABLET BY MOUTH  DAILY 90 tablet 0   Current Facility-Administered Medications on File Prior to Visit  Medication Dose Route Frequency Provider Last Rate Last Admin  . betamethasone acetate-betamethasone sodium phosphate (CELESTONE) injection 12 mg  12 mg Other Once Magnus Sinning, MD        Allergies  Allergen Reactions  . Amlodipine Swelling  . No Known Allergies     Blood pressure 128/64, pulse 83, resp. rate 15, height 5\' 8"  (1.727 m), weight 192 lb (87.1 kg), SpO2 96 %.  Essential hypertension Blood pressure is well controlled during OV today  and 5 out 7 days per home BP report. His pain is playing a role on his elevated BP when acutely worsen.  Will continue current medication but add hydralazine 50mg  PRN for BP elevated above 150. Patient was instructed to monitor BP daily and call clinic if BP >140 for 3 days on a row.     Daisee Centner Rodriguez-Guzman PharmD, BCPS, [place] [address] 11/21/2019 1:03 PM

## 2019-11-20 NOTE — Patient Instructions (Addendum)
Return for a follow up appointment in as needed  Check your blood pressure at home daily (if able) and keep record of the readings.  Take your BP meds as follows: *Take hydralazine 50mg  twice daily and 50mg  as needed for elevated BP above 150*  Bring all of your meds, your BP cuff and your record of home blood pressures to your next appointment.  Exercise as you're able, try to walk approximately 30 minutes per day.  Keep salt intake to a minimum, especially watch canned and prepared boxed foods.  Eat more fresh fruits and vegetables and fewer canned items.  Avoid eating in fast food restaurants.    HOW TO TAKE YOUR BLOOD PRESSURE: . Rest 5 minutes before taking your blood pressure. .  Don't smoke or drink caffeinated beverages for at least 30 minutes before. . Take your blood pressure before (not after) you eat. . Sit comfortably with your back supported and both feet on the floor (don't cross your legs). . Elevate your arm to heart level on a table or a desk. . Use the proper sized cuff. It should fit smoothly and snugly around your bare upper arm. There should be enough room to slip a fingertip under the cuff. The bottom edge of the cuff should be 1 inch above the crease of the elbow. . Ideally, take 3 measurements at one sitting and record the average.

## 2019-11-21 ENCOUNTER — Encounter: Payer: Self-pay | Admitting: Pharmacist

## 2019-11-21 NOTE — Assessment & Plan Note (Signed)
Blood pressure is well controlled during OV today and 5 out 7 days per home BP report. His pain is playing a role on his elevated BP when acutely worsen.  Will continue current medication but add hydralazine 50mg  PRN for BP elevated above 150. Patient was instructed to monitor BP daily and call clinic if BP >140 for 3 days on a row.

## 2019-12-10 ENCOUNTER — Ambulatory Visit: Payer: Medicare Other | Attending: Internal Medicine

## 2019-12-10 DIAGNOSIS — Z23 Encounter for immunization: Secondary | ICD-10-CM | POA: Insufficient documentation

## 2019-12-10 NOTE — Progress Notes (Signed)
   Covid-19 Vaccination Clinic  Name:  Frank Johnston    MRN: [id number] DOB: [birth year]  12/10/2019  Mr. Frank Johnston was observed post Covid-19 immunization for 15 minutes without incident. He was provided with Vaccine Information Sheet and instruction to access the V-Safe system.   Frank Johnston was instructed to call 911 with any severe reactions post vaccine: Marland Kitchen Difficulty breathing  . Swelling of face and throat  . A fast heartbeat  . A bad rash all over body  . Dizziness and weakness   Immunizations Administered    Name Date Dose VIS Date Route   Pfizer COVID-19 Vaccine 12/10/2019  2:22 PM 0.3 mL 09/13/2019 Intramuscular   Manufacturer: [place]   Lot: [id number]   [place]: [id number]

## 2019-12-11 ENCOUNTER — Other Ambulatory Visit: Payer: Self-pay | Admitting: Cardiovascular Disease

## 2019-12-11 NOTE — Telephone Encounter (Signed)
°*  STAT* If patient is at the pharmacy, call can be transferred to refill team.   1. Which medications need to be refilled? (please list name of each medication and dose if known) hydrALAZINE (APRESOLINE) 50 MG tablet  2. Which pharmacy/location (including street and city if local pharmacy) is medication to be sent to? [facility] - [place], [place] - [address] AT [address]  3. Do they need a 30 day or 90 day supply? [place]

## 2019-12-16 MED ORDER — HYDRALAZINE HCL 50 MG PO TABS: ORAL_TABLET | ORAL | 2 refills | Status: DC

## 2019-12-26 ENCOUNTER — Other Ambulatory Visit: Payer: Self-pay | Admitting: Cardiovascular Disease

## 2019-12-26 NOTE — Telephone Encounter (Signed)
*  STAT* If patient is at the pharmacy, call can be transferred to refill team.   1. Which medications need to be refilled? (please list name of each medication and dose if known) hydrALAZINE (APRESOLINE) 50 MG tablet  2. Which pharmacy/location (including street and city if local pharmacy) is medication to be sent to? [facility] [place], [place]  3. Do they need a 30 day or 90 day supply? 90 day

## 2019-12-27 MED ORDER — HYDRALAZINE HCL 50 MG PO TABS: ORAL_TABLET | ORAL | 2 refills | Status: DC

## 2020-01-02 DIAGNOSIS — H524 Presbyopia: Secondary | ICD-10-CM | POA: Diagnosis not present

## 2020-01-16 DIAGNOSIS — Z0000 Encounter for general adult medical examination without abnormal findings: Secondary | ICD-10-CM | POA: Diagnosis not present

## 2020-01-16 DIAGNOSIS — Z125 Encounter for screening for malignant neoplasm of prostate: Secondary | ICD-10-CM | POA: Diagnosis not present

## 2020-01-16 DIAGNOSIS — I10 Essential (primary) hypertension: Secondary | ICD-10-CM | POA: Diagnosis not present

## 2020-01-16 DIAGNOSIS — E7849 Other hyperlipidemia: Secondary | ICD-10-CM | POA: Diagnosis not present

## 2020-01-30 ENCOUNTER — Encounter: Payer: Self-pay | Admitting: Podiatry

## 2020-01-30 ENCOUNTER — Ambulatory Visit: Payer: Medicare Other | Admitting: Podiatry

## 2020-01-30 ENCOUNTER — Other Ambulatory Visit: Payer: Self-pay

## 2020-01-30 VITALS — Temp 97.9°F

## 2020-01-30 DIAGNOSIS — L03032 Cellulitis of left toe: Secondary | ICD-10-CM | POA: Diagnosis not present

## 2020-01-30 DIAGNOSIS — L608 Other nail disorders: Secondary | ICD-10-CM | POA: Diagnosis not present

## 2020-01-30 DIAGNOSIS — M79674 Pain in right toe(s): Secondary | ICD-10-CM | POA: Diagnosis not present

## 2020-01-30 DIAGNOSIS — B351 Tinea unguium: Secondary | ICD-10-CM

## 2020-01-30 DIAGNOSIS — M79675 Pain in left toe(s): Secondary | ICD-10-CM | POA: Diagnosis not present

## 2020-01-30 NOTE — Progress Notes (Signed)
This patient returns to my office for at risk foot care.  This patient requires this care by a professional since this patient will be at risk due to having history of DVT.    This patient is unable to cut nails himself since the patient cannot reach his nails.These nails are painful walking and wearing shoes.  This patient presents for at risk foot care today. He says he was scheduled for an appointment in 3 months and it should have been 10 weeks.  General Appearance  Alert, conversant and in no acute stress.  Vascular  Dorsalis pedis and posterior tibial  pulses are palpable  bilaterally.  Capillary return is within normal limits  bilaterally. Temperature is within normal limits  bilaterally.  Neurologic  Senn-Weinstein monofilament wire test within normal limits  bilaterally. Muscle power within normal limits bilaterally.  Nails Thick disfigured discolored nails with subungual debris  from hallux to fifth toes bilaterally. Abscess noted distal aspect lateral border left hallux.    Orthopedic  No limitations of motion  feet .  No crepitus or effusions noted.  No bony pathology or digital deformities noted.  Skin  normotropic skin with no porokeratosis noted bilaterally.  No signs of infections or ulcers noted.     Onychomycosis  Pain in right toes  Pain in left toes  Paronychia lateral border left hallux.    Consent was obtained for treatment procedures.   Mechanical debridement of nails 1-5  bilaterally performed with a nail nipper.  Filed with dremel without incident. Simple I & D lateral border left great toe.   Return office visit   10 weeks                  Told patient to return for periodic foot care and evaluation due to potential at risk complications.   Gardiner Barefoot DPM

## 2020-02-03 ENCOUNTER — Encounter (INDEPENDENT_AMBULATORY_CARE_PROVIDER_SITE_OTHER): Payer: Medicare Other | Admitting: Ophthalmology

## 2020-02-03 DIAGNOSIS — I10 Essential (primary) hypertension: Secondary | ICD-10-CM

## 2020-02-03 DIAGNOSIS — H35033 Hypertensive retinopathy, bilateral: Secondary | ICD-10-CM | POA: Diagnosis not present

## 2020-02-03 DIAGNOSIS — H35371 Puckering of macula, right eye: Secondary | ICD-10-CM | POA: Diagnosis not present

## 2020-02-03 DIAGNOSIS — H43813 Vitreous degeneration, bilateral: Secondary | ICD-10-CM

## 2020-02-10 ENCOUNTER — Other Ambulatory Visit: Payer: Self-pay | Admitting: Cardiovascular Disease

## 2020-02-10 MED ORDER — HYDRALAZINE HCL 50 MG PO TABS: ORAL_TABLET | ORAL | 3 refills | Status: AC

## 2020-02-10 NOTE — Telephone Encounter (Signed)
*  STAT* If patient is at the pharmacy, call can be transferred to refill team.   1. Which medications need to be refilled? (please list name of each medication and dose if known) hydrALAZINE (APRESOLINE) 50 MG tablet  2. Which pharmacy/location (including street and city if local pharmacy) is medication to be sent to? [facility] [place], [place]  3. Do they need a 30 day or 90 day supply? 90 day  Patient is almost of the medication

## 2020-03-06 DIAGNOSIS — M25512 Pain in left shoulder: Secondary | ICD-10-CM | POA: Diagnosis not present

## 2020-03-06 DIAGNOSIS — N1831 Chronic kidney disease, stage 3a: Secondary | ICD-10-CM | POA: Diagnosis not present

## 2020-03-06 DIAGNOSIS — M25511 Pain in right shoulder: Secondary | ICD-10-CM | POA: Diagnosis not present

## 2020-03-06 DIAGNOSIS — R82998 Other abnormal findings in urine: Secondary | ICD-10-CM | POA: Diagnosis not present

## 2020-03-06 DIAGNOSIS — Z0000 Encounter for general adult medical examination without abnormal findings: Secondary | ICD-10-CM | POA: Diagnosis not present

## 2020-03-18 DIAGNOSIS — Z1212 Encounter for screening for malignant neoplasm of rectum: Secondary | ICD-10-CM | POA: Diagnosis not present

## 2020-03-26 DIAGNOSIS — H00022 Hordeolum internum right lower eyelid: Secondary | ICD-10-CM | POA: Diagnosis not present

## 2020-03-26 DIAGNOSIS — H00032 Abscess of right lower eyelid: Secondary | ICD-10-CM | POA: Diagnosis not present

## 2020-04-07 DIAGNOSIS — H40013 Open angle with borderline findings, low risk, bilateral: Secondary | ICD-10-CM | POA: Diagnosis not present

## 2020-04-07 DIAGNOSIS — H00032 Abscess of right lower eyelid: Secondary | ICD-10-CM | POA: Diagnosis not present

## 2020-04-07 DIAGNOSIS — H0012 Chalazion right lower eyelid: Secondary | ICD-10-CM | POA: Diagnosis not present

## 2020-04-09 ENCOUNTER — Other Ambulatory Visit: Payer: Self-pay

## 2020-04-09 ENCOUNTER — Ambulatory Visit: Payer: Medicare Other | Admitting: Podiatry

## 2020-04-09 ENCOUNTER — Encounter: Payer: Self-pay | Admitting: Podiatry

## 2020-04-09 DIAGNOSIS — M79674 Pain in right toe(s): Secondary | ICD-10-CM

## 2020-04-09 DIAGNOSIS — B351 Tinea unguium: Secondary | ICD-10-CM | POA: Diagnosis not present

## 2020-04-09 DIAGNOSIS — L608 Other nail disorders: Secondary | ICD-10-CM

## 2020-04-09 DIAGNOSIS — M79675 Pain in left toe(s): Secondary | ICD-10-CM | POA: Diagnosis not present

## 2020-04-09 NOTE — Progress Notes (Signed)
This patient returns to my office for at risk foot care.  This patient requires this care by a professional since this patient will be at risk due to having history of DVT.    This patient is unable to cut nails himself since the patient cannot reach his nails.These nails are painful walking and wearing shoes.  This patient presents for at risk foot care today.   General Appearance  Alert, conversant and in no acute stress.  Vascular  Dorsalis pedis and posterior tibial  pulses are palpable  bilaterally.  Capillary return is within normal limits  bilaterally. Temperature is within normal limits  bilaterally.  Neurologic  Senn-Weinstein monofilament wire test within normal limits  bilaterally. Muscle power within normal limits bilaterally.  Nails Thick disfigured discolored nails with subungual debris  from hallux to fifth toes bilaterally.  Pain starting lateral border left great toe.   Orthopedic  No limitations of motion  feet .  No crepitus or effusions noted.  No bony pathology or digital deformities noted.  Skin  normotropic skin with no porokeratosis noted bilaterally.  No signs of infections or ulcers noted.     Onychomycosis  Pain in right toes  Pain in left toes  Paronychia lateral border left hallux.    Consent was obtained for treatment procedures.   Mechanical debridement of nails 1-5  bilaterally performed with a nail nipper.  Filed with dremel without incident.    Return office visit   10 weeks                  Told patient to return for periodic foot care and evaluation due to potential at risk complications.   Gardiner Barefoot DPM

## 2020-06-16 DIAGNOSIS — Z23 Encounter for immunization: Secondary | ICD-10-CM | POA: Diagnosis not present

## 2020-06-18 ENCOUNTER — Other Ambulatory Visit: Payer: Self-pay

## 2020-06-18 ENCOUNTER — Ambulatory Visit: Payer: Medicare Other | Admitting: Podiatry

## 2020-06-18 ENCOUNTER — Encounter: Payer: Self-pay | Admitting: Podiatry

## 2020-06-18 DIAGNOSIS — B351 Tinea unguium: Secondary | ICD-10-CM | POA: Diagnosis not present

## 2020-06-18 DIAGNOSIS — L608 Other nail disorders: Secondary | ICD-10-CM

## 2020-06-18 DIAGNOSIS — M79674 Pain in right toe(s): Secondary | ICD-10-CM

## 2020-06-18 DIAGNOSIS — M79675 Pain in left toe(s): Secondary | ICD-10-CM | POA: Diagnosis not present

## 2020-06-18 NOTE — Progress Notes (Signed)
This patient returns to my office for at risk foot care.  This patient requires this care by a professional since this patient will be at risk due to having history of DVT.    This patient is unable to cut nails himself since the patient cannot reach his nails.These nails are painful walking and wearing shoes.  This patient presents for at risk foot care today.   General Appearance  Alert, conversant and in no acute stress.  Vascular  Dorsalis pedis and posterior tibial  pulses are palpable  bilaterally.  Capillary return is within normal limits  bilaterally. Temperature is within normal limits  bilaterally.  Neurologic  Senn-Weinstein monofilament wire test within normal limits  bilaterally. Muscle power within normal limits bilaterally.  Nails Thick disfigured discolored nails with subungual debris  from hallux to fifth toes bilaterally.  Pain starting lateral border left great toe.   Orthopedic  No limitations of motion  feet .  No crepitus or effusions noted.  No bony pathology or digital deformities noted.  Skin  normotropic skin with no porokeratosis noted bilaterally.  No signs of infections or ulcers noted.     Onychomycosis  Pain in right toes  Pain in left toes  Paronychia lateral border left hallux.    Consent was obtained for treatment procedures.   Mechanical debridement of nails 1-5  bilaterally performed with a nail nipper.  Filed with dremel without incident.    Return office visit   10 weeks                  Told patient to return for periodic foot care and evaluation due to potential at risk complications.   Gardiner Barefoot DPM

## 2020-09-01 DIAGNOSIS — Z85828 Personal history of other malignant neoplasm of skin: Secondary | ICD-10-CM | POA: Diagnosis not present

## 2020-09-01 DIAGNOSIS — L570 Actinic keratosis: Secondary | ICD-10-CM | POA: Diagnosis not present

## 2020-09-01 DIAGNOSIS — L821 Other seborrheic keratosis: Secondary | ICD-10-CM | POA: Diagnosis not present

## 2020-09-01 DIAGNOSIS — D1801 Hemangioma of skin and subcutaneous tissue: Secondary | ICD-10-CM | POA: Diagnosis not present

## 2020-09-03 ENCOUNTER — Ambulatory Visit: Payer: Medicare Other | Admitting: Podiatry

## 2020-09-03 ENCOUNTER — Encounter: Payer: Self-pay | Admitting: Podiatry

## 2020-09-03 ENCOUNTER — Other Ambulatory Visit: Payer: Self-pay

## 2020-09-03 DIAGNOSIS — M79675 Pain in left toe(s): Secondary | ICD-10-CM | POA: Diagnosis not present

## 2020-09-03 DIAGNOSIS — L03032 Cellulitis of left toe: Secondary | ICD-10-CM

## 2020-09-03 DIAGNOSIS — L608 Other nail disorders: Secondary | ICD-10-CM | POA: Diagnosis not present

## 2020-09-03 DIAGNOSIS — B351 Tinea unguium: Secondary | ICD-10-CM

## 2020-09-03 DIAGNOSIS — M79674 Pain in right toe(s): Secondary | ICD-10-CM

## 2020-09-03 NOTE — Progress Notes (Signed)
This patient returns to my office for at risk foot care.  This patient requires this care by a professional since this patient will be at risk due to having history of DVT.    This patient is unable to cut nails himself since the patient cannot reach his nails.These nails are painful walking and wearing shoes.  This patient presents for at risk foot care today.   General Appearance  Alert, conversant and in no acute stress.  Vascular  Dorsalis pedis and posterior tibial  pulses are palpable  bilaterally.  Capillary return is within normal limits  bilaterally. Temperature is within normal limits  bilaterally.  Neurologic  Senn-Weinstein monofilament wire test within normal limits  bilaterally. Muscle power within normal limits bilaterally.  Nails Thick disfigured discolored nails with subungual debris  from hallux to fifth toes bilaterally.  Pain starting lateral border left great toe.   Orthopedic  No limitations of motion  feet .  No crepitus or effusions noted.  No bony pathology or digital deformities noted.  Skin  normotropic skin with no porokeratosis noted bilaterally.  No signs of infections or ulcers noted.     Onychomycosis  Pain in right toes  Pain in left toes  Paronychia lateral border left hallux.    Consent was obtained for treatment procedures.   Mechanical debridement of nails 1-5  bilaterally performed with a nail nipper.  Filed with dremel without incident.    Return office visit   10 weeks                  Told patient to return for periodic foot care and evaluation due to potential at risk complications.   Gardiner Barefoot DPM

## 2020-09-04 ENCOUNTER — Ambulatory Visit: Payer: Medicare Other | Attending: Internal Medicine

## 2020-09-04 DIAGNOSIS — Z23 Encounter for immunization: Secondary | ICD-10-CM

## 2020-09-04 NOTE — Progress Notes (Signed)
° °  Covid-19 Vaccination Clinic  Name:  Frank Johnston    MRN: [id number] DOB: [birth year]  09/04/2020  Frank Johnston was observed post Covid-19 immunization for 15 minutes without incident. He was provided with Vaccine Information Sheet and instruction to access the V-Safe system.   Frank Johnston was instructed to call 911 with any severe reactions post vaccine:  Difficulty breathing   Swelling of face and throat   A fast heartbeat   A bad rash all over body   Dizziness and weakness   Immunizations Administered    Name Date Dose VIS Date Route   Pfizer COVID-19 Vaccine 09/04/2020  1:36 PM 0.3 mL 07/22/2020 Intramuscular   Manufacturer: [place]   Lot: [id number]   ND[facility]: [place]

## 2020-10-05 DIAGNOSIS — J069 Acute upper respiratory infection, unspecified: Secondary | ICD-10-CM | POA: Diagnosis not present

## 2020-10-05 DIAGNOSIS — R0981 Nasal congestion: Secondary | ICD-10-CM | POA: Diagnosis not present

## 2020-10-05 DIAGNOSIS — I129 Hypertensive chronic kidney disease with stage 1 through stage 4 chronic kidney disease, or unspecified chronic kidney disease: Secondary | ICD-10-CM | POA: Diagnosis not present

## 2020-10-05 DIAGNOSIS — R059 Cough, unspecified: Secondary | ICD-10-CM | POA: Diagnosis not present

## 2020-10-08 ENCOUNTER — Ambulatory Visit: Payer: Medicare Other | Admitting: Cardiovascular Disease

## 2020-10-14 DIAGNOSIS — H35371 Puckering of macula, right eye: Secondary | ICD-10-CM | POA: Diagnosis not present

## 2020-10-14 DIAGNOSIS — H40013 Open angle with borderline findings, low risk, bilateral: Secondary | ICD-10-CM | POA: Diagnosis not present

## 2020-10-14 DIAGNOSIS — Z961 Presence of intraocular lens: Secondary | ICD-10-CM | POA: Diagnosis not present

## 2020-10-14 DIAGNOSIS — H26493 Other secondary cataract, bilateral: Secondary | ICD-10-CM | POA: Diagnosis not present

## 2020-11-12 ENCOUNTER — Encounter: Payer: Self-pay | Admitting: Podiatry

## 2020-11-12 ENCOUNTER — Ambulatory Visit: Payer: Medicare Other | Admitting: Podiatry

## 2020-11-12 ENCOUNTER — Other Ambulatory Visit: Payer: Self-pay

## 2020-11-12 DIAGNOSIS — M79675 Pain in left toe(s): Secondary | ICD-10-CM | POA: Diagnosis not present

## 2020-11-12 DIAGNOSIS — L608 Other nail disorders: Secondary | ICD-10-CM | POA: Diagnosis not present

## 2020-11-12 DIAGNOSIS — M79674 Pain in right toe(s): Secondary | ICD-10-CM

## 2020-11-12 DIAGNOSIS — B351 Tinea unguium: Secondary | ICD-10-CM

## 2020-11-12 NOTE — Progress Notes (Signed)
This patient returns to my office for at risk foot care.  This patient requires this care by a professional since this patient will be at risk due to having history of DVT and chronic kidney disease..    This patient is unable to cut nails himself since the patient cannot reach his nails.These nails are painful walking and wearing shoes.  This patient presents for at risk foot care today.   General Appearance  Alert, conversant and in no acute stress.  Vascular  Dorsalis pedis and posterior tibial  pulses are palpable  bilaterally.  Capillary return is within normal limits  bilaterally. Temperature is within normal limits  bilaterally.  Neurologic  Senn-Weinstein monofilament wire test within normal limits  bilaterally. Muscle power within normal limits bilaterally.  Nails Thick disfigured discolored nails with subungual debris  from hallux to fifth toes bilaterally.  Pain starting lateral border left great toe.   Orthopedic  No limitations of motion  feet .  No crepitus or effusions noted.  No bony pathology or digital deformities noted.  Skin  normotropic skin with no porokeratosis noted bilaterally.  No signs of infections or ulcers noted.     Onychomycosis  Pain in right toes  Pain in left toes  Paronychia lateral border left hallux.    Consent was obtained for treatment procedures.   Mechanical debridement of nails 1-5  bilaterally performed with a nail nipper.  Filed with dremel without incident.    Return office visit   10 weeks                  Told patient to return for periodic foot care and evaluation due to potential at risk complications.   Gardiner Barefoot DPM

## 2020-12-01 ENCOUNTER — Ambulatory Visit: Payer: Medicare Other | Admitting: Cardiovascular Disease

## 2020-12-15 ENCOUNTER — Other Ambulatory Visit: Payer: Self-pay

## 2020-12-15 ENCOUNTER — Ambulatory Visit (INDEPENDENT_AMBULATORY_CARE_PROVIDER_SITE_OTHER): Payer: Medicare Other | Admitting: Cardiovascular Disease

## 2020-12-15 ENCOUNTER — Encounter: Payer: Self-pay | Admitting: Cardiovascular Disease

## 2020-12-15 VITALS — BP 132/74 | HR 87 | Ht 68.0 in | Wt 193.0 lb

## 2020-12-15 DIAGNOSIS — I2699 Other pulmonary embolism without acute cor pulmonale: Secondary | ICD-10-CM | POA: Diagnosis not present

## 2020-12-15 DIAGNOSIS — I10 Essential (primary) hypertension: Secondary | ICD-10-CM

## 2020-12-15 NOTE — Progress Notes (Signed)
Cardiology Office Note   Date:  12/15/2020   ID:  Frank Johnston, DOB 1941/04/22, MRN [id number]  PCP:  Leanna Battles, MD  Cardiologist:   Skeet Latch, MD   No chief complaint on file.   Patient ID: Frank Johnston is a 80 y.o. male with hypertension and prior pulmonary embolism who presents for follow up.  Mr. Frank Johnston was initially seen 09/2015 for dyspnea on exertion.  He initially saw his PCP, Dr. [name] 08/2015. He reported dyspnea with minimal exertion. He was noted to have trace edema on exam.  He was referred for CXR that showed mild hyperexpansion but no active disease.  BNP was 17. D-dimer was elevated at 1.6.  He was referred for CT angiography of the chest that was negative for pulmonary embolus. The prior left lower lobe pulmonary embolism had resolved and his pulmonary arteries were normal in size.  No coronary calcifications were noted.  He was referred for [facility] 11/2015 that revealed a mild fixed defect in the apical and inferoseptal regions. There was no ischemia identified he had a normal ejection fraction.  He also had an echo that revealed an EF of 55-60% without wall motion abnormalities.  He was noted to have grade 1 diastolic dysfunction and mild mitral regurgitation.  Mr. Frank Johnston was referred to pulmonology where he underwent CPX testing. Ultimately his dyspnea was thought to be due to diastolic dysfunction, obesity, and deconditioning.  Verapamil was switched to amlodipine due to poorly controlled blood pressure.  It was then discontinued 2/2 edema.   At his last appointment Mr. Frank Johnston's blood pressure was intermittently elevated.  There is also concerned about his worsening renal function.  He followed up with our pharmacist 11/2019 and hydralazine was added as needed, as his blood pressure was well-controlled at that visit.  In the interim, spironolactone was discontinued and he was started on hydralazine standing with additional as  needed.  Overall he has been well.  He walks for exercise a couple times per week.  His mother in law passed and he hasn't exercised much.  He denies exertional chest pain or shortness of breath.  He rarely uses Aleve.  He is trying to use Tylenol instead.    Past Medical History:  Diagnosis Date   Arthritis    Chest pain 10/02/2015   Chronic kidney disease    hx kidney stones   GERD (gastroesophageal reflux disease)    H/O hiatal hernia    Hypertension    Wears glasses     Past Surgical History:  Procedure Laterality Date   25 GAUGE PARS PLANA VITRECTOMY WITH 20 GAUGE MVR PORT Right 09/06/2016   Procedure: 25 GAUGE PARS PLANA VITRECTOMY WITH 20 GAUGE MVR PORT;  Surgeon: [name], MD;  Location: [place];  Service: Ophthalmology;  Laterality: Right;   [place]   lumbar lam   CARPAL TUNNEL RELEASE  12/06/2011   Procedure: CARPAL TUNNEL RELEASE;  Surgeon: Wynonia Sours, MD;  Location: [place];  Service: Orthopedics;  Laterality: Right;   CARPAL TUNNEL RELEASE  08/07/2012   Procedure: CARPAL TUNNEL RELEASE;  Surgeon: Wynonia Sours, MD;  Location: [place];  Service: Orthopedics;  Laterality: Left;  carpal tunnel release   CHOLECYSTECTOMY  1976   COLONOSCOPY     JOINT REPLACEMENT  4/12   lt total knee   KNEE ARTHROSCOPY     rt and lt  KNEE CLOSED REDUCTION  6/12   lt -after lt total knee   TRIGGER FINGER RELEASE  12/06/2011   Procedure: RELEASE TRIGGER FINGER/A-1 PULLEY;  Surgeon: Wynonia Sours, MD;  Location: [place];  Service: Orthopedics;  Laterality: Right;  right thumb, middle, and ring fingers   TRIGGER FINGER RELEASE  08/07/2012   Procedure: RELEASE TRIGGER FINGER/A-1 PULLEY;  Surgeon: Wynonia Sours, MD;  Location: [place];  Service: Orthopedics;  Laterality: Left;  trigger release left ring finger     Current Outpatient Medications  Medication Sig Dispense  Refill   acetaminophen (TYLENOL) 500 MG tablet Take 1,000 mg by mouth daily as needed for moderate pain or headache.     aspirin 81 MG tablet Take 81 mg by mouth every evening.      fluticasone (FLONASE) 50 MCG/ACT nasal spray Place 2 sprays into the nose at bedtime as needed for allergies.      hydrALAZINE (APRESOLINE) 50 MG tablet Take 1 tablet (50 mg total) by mouth 2 (two) times daily. May also take 1 tablet (50 mg total) daily as needed (for systolic blood pressure above 150). 180 tablet 3   HYDROcodone-acetaminophen (NORCO/VICODIN) 5-325 MG tablet      metoCLOPramide (REGLAN) 5 MG tablet Take 5 mg by mouth at bedtime.      naproxen sodium (ALEVE) 220 MG tablet Take 220 mg by mouth daily as needed.     omeprazole (PRILOSEC) 40 MG capsule Take 40 mg by mouth every evening.     Probiotic Product (PROBIOTIC ADVANCED PO) Take 1 capsule by mouth at bedtime.      SENNA-S 8.6-50 MG tablet TK 1 T PO BID PRN     therapeutic multivitamin-minerals (THERAGRAN-M) tablet Take 1 tablet by mouth daily.     valsartan-hydrochlorothiazide (DIOVAN-HCT) 320-12.5 MG tablet Take 1 tablet by mouth daily.     Current Facility-Administered Medications  Medication Dose Route Frequency Provider Last Rate Last Admin   betamethasone acetate-betamethasone sodium phosphate (CELESTONE) injection 12 mg  12 mg Other Once Magnus Sinning, MD        Allergies:   Amlodipine and No known allergies    Social History:  The patient  reports that he quit smoking about 26 years ago. His smoking use included cigarettes. He has a 40.00 pack-year smoking history. He has never used smokeless tobacco. He reports that he does not drink alcohol and does not use drugs.   Family History:  The patient's family history includes Cancer in his father; Hypertension in his child and child; Stroke in his mother.    ROS:  Please see the history of present illness.   Otherwise, review of systems are positive for none.   All other  systems are reviewed and negative.    PHYSICAL EXAM:  EKG:  EKG is ordered today. 01/30/18: Sinus rhythm.  Rate 69 bpm.  First degree AV block.  08/06/18: Sinus rhythm.  Rate 84 bpm.  LAFB.    Recent Labs: No results found for requested labs within last 8760 hours.   08/15/15: BNP 17.2  D-dimer 1.6 (normal 0-0.49) WBC 10.2, hemoglobin 16.3, hematocrit 46.3, platelets 225 Sodium 140, Potassium 3.8, BUN 20, creatinine 1.1, glucose 112  Echo 11/16/15: Study Conclusions  - Left ventricle: The cavity size was normal. Wall thickness was   increased in a pattern of mild LVH. Systolic function was normal.   The estimated ejection fraction was in the range of 55% to 60%.   Wall  motion was normal; there were no regional wall motion   abnormalities. Doppler parameters are consistent with abnormal   left ventricular relaxation (grade 1 diastolic dysfunction). - Mitral valve: There was mild regurgitation.   Lipid Panel No results found for: CHOL, TRIG, HDL, CHOLHDL, VLDL, LDLCALC, LDLDIRECT   12/15/2017: Total cholesterol 170, LDL, HDL 59 BUN 24, creatinine 1.3 AST 19, ALT 21   Wt Readings from Last 3 Encounters:  12/15/20 193 lb (87.5 kg)  11/20/19 192 lb (87.1 kg)  09/19/19 190 lb (86.2 kg)      ASSESSMENT AND PLAN:  # Hypertension: Blood pressure was elevated here but has been controlled at home.  Continue hydralazine, valsartan and HCTZ.   Track BP at home and bring log and machine to follow up.  # CV Disease prevention: Lipids followed by Dr. Philip Aspen.    Current medicines are reviewed at length with the patient today.  The patient does not have concerns regarding medicines.  The following changes have been made:  no change  Labs/ tests ordered today include:   Orders Placed This Encounter  Procedures   EKG 12-Lead     Disposition:   FU with Roshaun Pound C. Oval Linsey, MD, [facility] in 1 year.  PharmD in 2 months.    Signed, Matalynn Graff C. Oval Linsey, MD, [facility]  12/15/2020  10:23 AM    [place]   Cardiology Office Note   Date:  12/15/2020   ID:  Frank Johnston, DOB 05/09/1941, MRN [id number]  PCP:  Leanna Battles, MD  Cardiologist:   Skeet Latch, MD   No chief complaint on file.   Patient ID: Frank Johnston is a 80 y.o. male with hypertension and prior pulmonary embolism who presents for follow up.  Mr. Everitt was initially seen 09/2015 for dyspnea on exertion.  He initially saw his PCP, Dr. [name] 08/2015. He reported dyspnea with minimal exertion. He was noted to have trace edema on exam.  He was referred for CXR that showed mild hyperexpansion but no active disease.  BNP was 17. D-dimer was elevated at 1.6.  He was referred for CT angiography of the chest that was negative for pulmonary embolus The prior left lower lobe pulmonary embolism had resolved and his pulmonary arteries were normal in size.  No coronary calcifications were noted.  He was referred for [facility] 11/2015 that revealed a mild fixed defect in the apical and inferoseptal regions. There was no ischemia identified he had a normal ejection fraction.  He also had an echo that revealed an EF of 55-60% without wall motion abnormalities.  He was noted to have grade 1 diastolic dysfunction and mild mitral regurgitation.  Mr. Cordner was referred to pulmonology where he underwent CPX testing. Ultimately his dyspnea was thought to be due to diastolic dysfunction, obesity, and deconditioning.  Verapamil was switched to amlodipine due to poorly controlled blood pressure.  He followed up with our pharmacist 03/2018 and his blood pressure was well have been controlled.  He had LE edema on amlodipine so it was switched to spironolactone.  Since that time his BP has been well-controlled and he feels well.  He has no more edema.  Lately his blood pressure has been pretty well-controlled.  His mother-in-law had 2 strokes and was bed ridden.  For the last  week or so he has not exercised as much.  She passed away and they have been handling funeral arrangements.  In general he tried to to walk  for at least 30 minutes a couple times per week.  He feels good with exercise and has no exertional chest pain or shortness of breath.  He denies any palpitations, lightheadedness, or dizziness.  In general his blood pressure has been well-controlled and he is eating well.  After his last appointment he saw our pharmacist and his blood pressure was well-controlled at that visit but had been intermittently high at home.  He was given hydralazine to take as needed but does not often need to use it.   Past Medical History:  Diagnosis Date   Arthritis    Chest pain 10/02/2015   Chronic kidney disease    hx kidney stones   GERD (gastroesophageal reflux disease)    H/O hiatal hernia    Hypertension    Wears glasses     Past Surgical History:  Procedure Laterality Date   25 GAUGE PARS PLANA VITRECTOMY WITH 20 GAUGE MVR PORT Right 09/06/2016   Procedure: 25 GAUGE PARS PLANA VITRECTOMY WITH 20 GAUGE MVR PORT;  Surgeon: [name], MD;  Location: [place];  Service: Ophthalmology;  Laterality: Right;   [place]   lumbar lam   CARPAL TUNNEL RELEASE  12/06/2011   Procedure: CARPAL TUNNEL RELEASE;  Surgeon: Wynonia Sours, MD;  Location: [place];  Service: Orthopedics;  Laterality: Right;   CARPAL TUNNEL RELEASE  08/07/2012   Procedure: CARPAL TUNNEL RELEASE;  Surgeon: Wynonia Sours, MD;  Location: [place];  Service: Orthopedics;  Laterality: Left;  carpal tunnel release   CHOLECYSTECTOMY  1976   COLONOSCOPY     JOINT REPLACEMENT  4/12   lt total knee   KNEE ARTHROSCOPY     rt and lt   KNEE CLOSED REDUCTION  6/12   lt -after lt total knee   TRIGGER FINGER RELEASE  12/06/2011   Procedure: RELEASE TRIGGER FINGER/A-1 PULLEY;  Surgeon: Wynonia Sours, MD;  Location: [place];  Service: Orthopedics;  Laterality: Right;  right thumb, middle, and ring fingers   TRIGGER FINGER RELEASE  08/07/2012   Procedure: RELEASE TRIGGER FINGER/A-1 PULLEY;  Surgeon: Wynonia Sours, MD;  Location: [place];  Service: Orthopedics;  Laterality: Left;  trigger release left ring finger     Current Outpatient Medications  Medication Sig Dispense Refill   acetaminophen (TYLENOL) 500 MG tablet Take 1,000 mg by mouth daily as needed for moderate pain or headache.     aspirin 81 MG tablet Take 81 mg by mouth every evening.      fluticasone (FLONASE) 50 MCG/ACT nasal spray Place 2 sprays into the nose at bedtime as needed for allergies.      hydrALAZINE (APRESOLINE) 50 MG tablet Take 1 tablet (50 mg total) by mouth 2 (two) times daily. May also take 1 tablet (50 mg total) daily as needed (for systolic blood pressure above 150). 180 tablet 3   HYDROcodone-acetaminophen (NORCO/VICODIN) 5-325 MG tablet      metoCLOPramide (REGLAN) 5 MG tablet Take 5 mg by mouth at bedtime.      naproxen sodium (ALEVE) 220 MG tablet Take 220 mg by mouth daily as needed.     omeprazole (PRILOSEC) 40 MG capsule Take 40 mg by mouth every evening.     Probiotic Product (PROBIOTIC ADVANCED PO) Take 1 capsule by mouth at bedtime.      SENNA-S 8.6-50 MG tablet TK 1 T PO BID PRN  therapeutic multivitamin-minerals (THERAGRAN-M) tablet Take 1 tablet by mouth daily.     valsartan-hydrochlorothiazide (DIOVAN-HCT) 320-12.5 MG tablet Take 1 tablet by mouth daily.     Current Facility-Administered Medications  Medication Dose Route Frequency Provider Last Rate Last Admin   betamethasone acetate-betamethasone sodium phosphate (CELESTONE) injection 12 mg  12 mg Other Once Magnus Sinning, MD        Allergies:   Amlodipine and No known allergies    Social History:  The patient  reports that he quit smoking about 26 years ago. His smoking use included cigarettes. He has a 40.00  pack-year smoking history. He has never used smokeless tobacco. He reports that he does not drink alcohol and does not use drugs.   Family History:  The patient's family history includes Cancer in his father; Hypertension in his child and child; Stroke in his mother.    ROS:  Please see the history of present illness.   Otherwise, review of systems are positive for none.   All other systems are reviewed and negative.    PHYSICAL EXAM: VS:  BP 132/74    Pulse 87    Ht 5\' 8"  (1.727 m)    Wt 193 lb (87.5 kg)    SpO2 95%    BMI 29.35 kg/m  , BMI Body mass index is 29.35 kg/m. GENERAL:  Well appearing HEENT: Pupils equal round and reactive, fundi not visualized, oral mucosa unremarkable NECK:  No jugular venous distention, waveform within normal limits, carotid upstroke brisk and symmetric, no bruits LUNGS:  Clear to auscultation bilaterally HEART:  RRR.  PMI not displaced or sustained,S1 and S2 within normal limits, no S3, no S4, no clicks, no rubs, no murmurs ABD:  Flat, positive bowel sounds normal in frequency in pitch, no bruits, no rebound, no guarding, no midline pulsatile mass, no hepatomegaly, no splenomegaly EXT:  2 plus pulses throughout, no edema, no cyanosis no clubbing SKIN:  No rashes no nodules NEURO:  Cranial nerves II through XII grossly intact, motor grossly intact throughout PSYCH:  Cognitively intact, oriented to person place and time  EKG:  EKG is ordered today. 01/30/18: Sinus rhythm.  Rate 69 bpm.  First degree AV block.  08/06/18: Sinus rhythm.  Rate 84 bpm.  LAFB.   09/19/19: Sinus rhythm.  Rate 71 bpm.  First-degree AV block 12/15/20: Sinus rhythm.  PAC.  Rate 87 bpm.  LAD.  Recent Labs: No results found for requested labs within last 8760 hours.   08/15/15: BNP 17.2  D-dimer 1.6 (normal 0-0.49) WBC 10.2, hemoglobin 16.3, hematocrit 46.3, platelets 225  sodium 140, Potassium 3.8, BUN 20, creatinine 1.1, glucose 112  Echo 11/16/15: Study Conclusions  - Left  ventricle: The cavity size was normal. Wall thickness was   increased in a pattern of mild LVH. Systolic function was normal.   The estimated ejection fraction was in the range of 55% to 60%.   Wall motion was normal; there were no regional wall motion   abnormalities. Doppler parameters are consistent with abnormal   left ventricular relaxation (grade 1 diastolic dysfunction). - Mitral valve: There was mild regurgitation.   Lipid Panel No results found for: CHOL, TRIG, HDL, CHOLHDL, VLDL, LDLCALC, LDLDIRECT   12/15/2017: Total cholesterol 170, LDL, HDL 59 BUN 24, creatinine 1.3 AST 19, ALT 21   Wt Readings from Last 3 Encounters:  12/15/20 193 lb (87.5 kg)  11/20/19 192 lb (87.1 kg)  09/19/19 190 lb (86.2 kg)     ASSESSMENT AND  PLAN:  # Hypertension:  Blood pressure stable.  He reports that his renal function was good when it was checked last year with his PCP.  He is going again in May.  For now, continue losartan/HCTZ.  He is going to have his labs sent to [place] when he gets them checked next month.  He is no longer on spironolactone.  Continue hydralazine 50 mg twice daily and an additional as needed.  # CV Disease prevention: Lipids followed by Dr. Philip Aspen.  He will ask to send a copy at his appointment in May.  # Prior PE:  Resolved.    Current medicines are reviewed at length with the patient today.  The patient does not have concerns regarding medicines.  The following changes have been made:  no change  Labs/ tests ordered today include:   Orders Placed This Encounter  Procedures   EKG 12-Lead     Disposition:   FU with Paola Flynt C. Oval Linsey, MD, [facility] in 1 year.   Signed, Ilia Dimaano C. Oval Linsey, MD, [facility]  12/15/2020 10:23 AM    [place]

## 2020-12-15 NOTE — Patient Instructions (Signed)
Medication Instructions:  No Changes In Medications at this time.  *If you need a refill on your cardiac medications before your next appointment, please call your pharmacy*  Follow-Up: At [facility], you and your health needs are our priority.  As part of our continuing mission to provide you with exceptional heart care, we have created designated Provider Care Teams.  These Care Teams include your primary Cardiologist (physician) and Advanced Practice Providers (APPs -  Physician Assistants and Nurse Practitioners) who all work together to provide you with the care you need, when you need it.  We recommend signing up for the patient portal called "MyChart".  Sign up information is provided on this After Visit Summary.  MyChart is used to connect with patients for Virtual Visits (Telemedicine).  Patients are able to view lab/test results, encounter notes, upcoming appointments, etc.  Non-urgent messages can be sent to your provider as well.   To learn more about what you can do with MyChart, go to [email or web address].    Your next appointment:   1 year(s)  The format for your next appointment:   In Person  Provider:   Skeet Latch, MD

## 2021-01-07 ENCOUNTER — Other Ambulatory Visit: Payer: Self-pay

## 2021-01-07 ENCOUNTER — Ambulatory Visit: Payer: Medicare Other | Admitting: Podiatry

## 2021-01-07 ENCOUNTER — Encounter: Payer: Self-pay | Admitting: Podiatry

## 2021-01-07 DIAGNOSIS — L03032 Cellulitis of left toe: Secondary | ICD-10-CM

## 2021-01-07 MED ORDER — CEPHALEXIN 500 MG PO CAPS: 500.0000 mg | ORAL_CAPSULE | Freq: Two times a day (BID) | ORAL | 0 refills | Status: DC

## 2021-01-07 NOTE — Progress Notes (Signed)
This patient returns to my office for at risk foot care.  This patient requires this care by a professional since this patient will be at risk due to having history of DVT and chronic kidney disease..   This patient has chronic pain due to ingrown nail outside border left big toe.  We have treated this with nail treatment but there is pain redness noted.  He presents to the office for evaluation and treatment.  General Appearance  Alert, conversant and in no acute stress.  Vascular  Dorsalis pedis and posterior tibial  pulses are palpable  bilaterally.  Capillary return is within normal limits  bilaterally. Temperature is within normal limits  Bilaterally. Hair present  Bilaterally on digits.  Neurologic  Senn-Weinstein monofilament wire test within normal limits  bilaterally. Muscle power within normal limits bilaterally.  Nails Thick disfigured discolored nails with subungual debris  from hallux to fifth toes bilaterally.  Redness and swelling and pain along the lateral border left hallux.  Orthopedic  No limitations of motion  feet .  No crepitus or effusions noted.  No bony pathology or digital deformities noted.  Skin  normotropic skin with no porokeratosis noted bilaterally.  No signs of infections or ulcers noted.     Paronychia left hallux lateral border.  Recommended permanent nail surgery lateral border left hallux  Patient agreed. Treatment options and alternatives discussed.  Recommended permanent phenol matrixectomy and patient agreed.  Left hallux  was prepped with alcohol and a toe block of 5cc of 2% lidocaine plain was administered in a digital toe block. .  The toe was then prepped with betadine solution . A tourniquet was applied to toe. The offending nail border was then excised and matrix tissue exposed.  Phenol was then applied to the matrix tissue followed by an alcohol wash.  Antibiotic ointment and a dry sterile dressing was applied.  The patient was dispensed instructions for  aftercare. Prescribe cephalexin  Po.  RTC 10 days     Gardiner Barefoot DPM    Gardiner Barefoot DPM

## 2021-01-18 ENCOUNTER — Ambulatory Visit: Payer: Medicare Other | Admitting: Podiatry

## 2021-01-18 ENCOUNTER — Other Ambulatory Visit: Payer: Self-pay

## 2021-01-18 ENCOUNTER — Encounter: Payer: Self-pay | Admitting: Podiatry

## 2021-01-18 DIAGNOSIS — Z09 Encounter for follow-up examination after completed treatment for conditions other than malignant neoplasm: Secondary | ICD-10-CM

## 2021-01-18 NOTE — Progress Notes (Signed)
This patient returns to the office following nail surgery one week ago.  The patient says toe has been soaked and bandaged as directed.  There has been improvement of the toe since the surgery has been performed.  Patient has developed red streaking on the toe which he believes is caused by the tourniquet. The patient presents for continued evaluation and treatment.  GENERAL APPEARANCE: Alert, conversant. Appropriately groomed. No acute distress.  VASCULAR: Pedal pulses palpable at  [facility] and PT bilateral.  Capillary refill time is immediate to all digits,  Normal temperature gradient.    NEUROLOGIC: sensation is normal to 5.07 monofilament at 5/5 sites bilateral.  Light touch is intact bilateral, Muscle strength normal.  MUSCULOSKELETAL: acceptable muscle strength, tone and stability bilateral.  Intrinsic muscluature intact bilateral.  Rectus appearance of foot and digits noted bilateral.   DERMATOLOGIC: skin color, texture, and turgor are within normal limits.  No preulcerative lesions or ulcers  are seen, no interdigital maceration noted.   NAILS  There is necrotic tissue along the nail groove  In the absence of redness swelling and pain.  Red streaking on dorsum of left toe.  DX  S/p nail surgery  ROV  Home instructions were discussed.  Patient to call the office if there are any questions or concerns. Patient developed a chemical burn from the phenol.  RTC 10 weeks.   Gardiner Barefoot DPM

## 2021-01-21 ENCOUNTER — Ambulatory Visit: Payer: Medicare Other | Admitting: Podiatry

## 2021-01-26 ENCOUNTER — Telehealth: Payer: Self-pay | Admitting: Cardiovascular Disease

## 2021-01-26 NOTE — Telephone Encounter (Signed)
Pt would like to have a provider switch from Dr. Skeet Latch to Dr. Grayland Jack. New location is further from his home address.

## 2021-02-03 ENCOUNTER — Other Ambulatory Visit: Payer: Self-pay

## 2021-02-03 ENCOUNTER — Encounter (INDEPENDENT_AMBULATORY_CARE_PROVIDER_SITE_OTHER): Payer: Medicare Other | Admitting: Ophthalmology

## 2021-02-03 DIAGNOSIS — H43812 Vitreous degeneration, left eye: Secondary | ICD-10-CM

## 2021-02-03 DIAGNOSIS — I10 Essential (primary) hypertension: Secondary | ICD-10-CM | POA: Diagnosis not present

## 2021-02-03 DIAGNOSIS — H35371 Puckering of macula, right eye: Secondary | ICD-10-CM | POA: Diagnosis not present

## 2021-02-03 DIAGNOSIS — H26491 Other secondary cataract, right eye: Secondary | ICD-10-CM

## 2021-02-03 DIAGNOSIS — H35033 Hypertensive retinopathy, bilateral: Secondary | ICD-10-CM

## 2021-02-23 ENCOUNTER — Encounter (INDEPENDENT_AMBULATORY_CARE_PROVIDER_SITE_OTHER): Payer: Medicare Other | Admitting: Ophthalmology

## 2021-02-23 ENCOUNTER — Other Ambulatory Visit: Payer: Self-pay

## 2021-02-23 DIAGNOSIS — H26491 Other secondary cataract, right eye: Secondary | ICD-10-CM

## 2021-03-16 DIAGNOSIS — Z125 Encounter for screening for malignant neoplasm of prostate: Secondary | ICD-10-CM | POA: Diagnosis not present

## 2021-03-16 DIAGNOSIS — I10 Essential (primary) hypertension: Secondary | ICD-10-CM | POA: Diagnosis not present

## 2021-03-16 DIAGNOSIS — Z0000 Encounter for general adult medical examination without abnormal findings: Secondary | ICD-10-CM | POA: Diagnosis not present

## 2021-03-23 DIAGNOSIS — M25561 Pain in right knee: Secondary | ICD-10-CM | POA: Diagnosis not present

## 2021-03-23 DIAGNOSIS — Z96652 Presence of left artificial knee joint: Secondary | ICD-10-CM | POA: Diagnosis not present

## 2021-03-23 DIAGNOSIS — Z0000 Encounter for general adult medical examination without abnormal findings: Secondary | ICD-10-CM | POA: Diagnosis not present

## 2021-03-23 DIAGNOSIS — M5136 Other intervertebral disc degeneration, lumbar region: Secondary | ICD-10-CM | POA: Diagnosis not present

## 2021-03-23 DIAGNOSIS — R82998 Other abnormal findings in urine: Secondary | ICD-10-CM | POA: Diagnosis not present

## 2021-03-23 DIAGNOSIS — I129 Hypertensive chronic kidney disease with stage 1 through stage 4 chronic kidney disease, or unspecified chronic kidney disease: Secondary | ICD-10-CM | POA: Diagnosis not present

## 2021-03-29 ENCOUNTER — Ambulatory Visit: Payer: Medicare Other | Admitting: Podiatry

## 2021-04-08 ENCOUNTER — Ambulatory Visit: Payer: Medicare Other | Admitting: Podiatry

## 2021-04-08 ENCOUNTER — Encounter: Payer: Self-pay | Admitting: Podiatry

## 2021-04-08 ENCOUNTER — Other Ambulatory Visit: Payer: Self-pay

## 2021-04-08 DIAGNOSIS — M79675 Pain in left toe(s): Secondary | ICD-10-CM

## 2021-04-08 DIAGNOSIS — M79674 Pain in right toe(s): Secondary | ICD-10-CM

## 2021-04-08 DIAGNOSIS — L608 Other nail disorders: Secondary | ICD-10-CM

## 2021-04-08 DIAGNOSIS — B351 Tinea unguium: Secondary | ICD-10-CM

## 2021-04-08 NOTE — Progress Notes (Signed)
This patient returns to my office for at risk foot care.  This patient requires this care by a professional since this patient will be at risk due to having history of DVT.    This patient is unable to cut nails himself since the patient cannot reach his nails.These nails are painful walking and wearing shoes.  This patient presents for at risk foot care today.   General Appearance  Alert, conversant and in no acute stress.  Vascular  Dorsalis pedis and posterior tibial  pulses are palpable  bilaterally.  Capillary return is within normal limits  bilaterally. Temperature is within normal limits  bilaterally.  Neurologic  Senn-Weinstein monofilament wire test within normal limits  bilaterally. Muscle power within normal limits bilaterally.  Nails Thick disfigured discolored nails with subungual debris  from hallux to fifth toes bilaterally.  Pain starting lateral border left great toe.   Orthopedic  No limitations of motion  feet .  No crepitus or effusions noted.  No bony pathology or digital deformities noted.  Skin  normotropic skin with no porokeratosis noted bilaterally.  No signs of infections or ulcers noted.     Onychomycosis  Pain in right toes  Pain in left toes  Paronychia lateral border left hallux.    Consent was obtained for treatment procedures.   Mechanical debridement of nails 1-5  bilaterally performed with a nail nipper.  Filed with dremel without incident.    Return office visit   10 weeks                  Told patient to return for periodic foot care and evaluation due to potential at risk complications.   Gardiner Barefoot DPM

## 2021-06-17 ENCOUNTER — Ambulatory Visit: Payer: Medicare Other | Admitting: Podiatry

## 2021-06-17 ENCOUNTER — Encounter (INDEPENDENT_AMBULATORY_CARE_PROVIDER_SITE_OTHER): Payer: Self-pay

## 2021-06-17 ENCOUNTER — Other Ambulatory Visit: Payer: Self-pay

## 2021-06-17 ENCOUNTER — Encounter: Payer: Self-pay | Admitting: Podiatry

## 2021-06-17 DIAGNOSIS — N200 Calculus of kidney: Secondary | ICD-10-CM | POA: Diagnosis not present

## 2021-06-17 DIAGNOSIS — M79674 Pain in right toe(s): Secondary | ICD-10-CM

## 2021-06-17 DIAGNOSIS — M79675 Pain in left toe(s): Secondary | ICD-10-CM

## 2021-06-17 DIAGNOSIS — L608 Other nail disorders: Secondary | ICD-10-CM | POA: Diagnosis not present

## 2021-06-17 DIAGNOSIS — B351 Tinea unguium: Secondary | ICD-10-CM

## 2021-06-17 NOTE — Progress Notes (Signed)
This patient returns to my office for at risk foot care.  This patient requires this care by a professional since this patient will be at risk due to having history of DVT.    This patient is unable to cut nails himself since the patient cannot reach his nails.These nails are painful walking and wearing shoes.  This patient presents for at risk foot care today.   General Appearance  Alert, conversant and in no acute stress.  Vascular  Dorsalis pedis and posterior tibial  pulses are palpable  bilaterally.  Capillary return is within normal limits  bilaterally. Temperature is within normal limits  bilaterally.  Neurologic  Senn-Weinstein monofilament wire test within normal limits  bilaterally. Muscle power within normal limits bilaterally.  Nails Thick disfigured discolored nails with subungual debris  from hallux to fifth toes bilaterally.    Orthopedic  No limitations of motion  feet .  No crepitus or effusions noted.  No bony pathology or digital deformities noted.  Skin  normotropic skin with no porokeratosis noted bilaterally.  No signs of infections or ulcers noted.     Onychomycosis  Pain in right toes  Pain in left toes     Consent was obtained for treatment procedures.   Mechanical debridement of nails 1-5  bilaterally performed with a nail nipper.  Filed with dremel without incident.    Return office visit   10 weeks                  Told patient to return for periodic foot care and evaluation due to potential at risk complications.   Gardiner Barefoot DPM

## 2021-06-23 DIAGNOSIS — C4441 Basal cell carcinoma of skin of scalp and neck: Secondary | ICD-10-CM | POA: Diagnosis not present

## 2021-06-23 DIAGNOSIS — L570 Actinic keratosis: Secondary | ICD-10-CM | POA: Diagnosis not present

## 2021-06-23 DIAGNOSIS — Z85828 Personal history of other malignant neoplasm of skin: Secondary | ICD-10-CM | POA: Diagnosis not present

## 2021-06-23 DIAGNOSIS — D485 Neoplasm of uncertain behavior of skin: Secondary | ICD-10-CM | POA: Diagnosis not present

## 2021-06-28 DIAGNOSIS — Z23 Encounter for immunization: Secondary | ICD-10-CM | POA: Diagnosis not present

## 2021-07-14 DIAGNOSIS — H40013 Open angle with borderline findings, low risk, bilateral: Secondary | ICD-10-CM | POA: Diagnosis not present

## 2021-09-06 DIAGNOSIS — L812 Freckles: Secondary | ICD-10-CM | POA: Diagnosis not present

## 2021-09-06 DIAGNOSIS — D1801 Hemangioma of skin and subcutaneous tissue: Secondary | ICD-10-CM | POA: Diagnosis not present

## 2021-09-06 DIAGNOSIS — Z85828 Personal history of other malignant neoplasm of skin: Secondary | ICD-10-CM | POA: Diagnosis not present

## 2021-09-06 DIAGNOSIS — L821 Other seborrheic keratosis: Secondary | ICD-10-CM | POA: Diagnosis not present

## 2021-09-06 DIAGNOSIS — D0462 Carcinoma in situ of skin of left upper limb, including shoulder: Secondary | ICD-10-CM | POA: Diagnosis not present

## 2021-09-16 ENCOUNTER — Ambulatory Visit: Payer: Medicare Other | Admitting: Podiatry

## 2021-09-30 ENCOUNTER — Ambulatory Visit: Payer: Medicare Other | Admitting: Podiatry

## 2021-09-30 ENCOUNTER — Encounter: Payer: Self-pay | Admitting: Podiatry

## 2021-09-30 ENCOUNTER — Other Ambulatory Visit: Payer: Self-pay

## 2021-09-30 DIAGNOSIS — B351 Tinea unguium: Secondary | ICD-10-CM | POA: Diagnosis not present

## 2021-09-30 DIAGNOSIS — M79674 Pain in right toe(s): Secondary | ICD-10-CM | POA: Diagnosis not present

## 2021-09-30 DIAGNOSIS — M79675 Pain in left toe(s): Secondary | ICD-10-CM

## 2021-09-30 DIAGNOSIS — L608 Other nail disorders: Secondary | ICD-10-CM

## 2021-09-30 NOTE — Progress Notes (Signed)
This patient returns to my office for at risk foot care.  This patient requires this care by a professional since this patient will be at risk due to having history of DVT.    This patient is unable to cut nails himself since the patient cannot reach his nails.These nails are painful walking and wearing shoes.  This patient presents for at risk foot care today.   General Appearance  Alert, conversant and in no acute stress.  Vascular  Dorsalis pedis and posterior tibial  pulses are palpable  bilaterally.  Capillary return is within normal limits  bilaterally. Temperature is within normal limits  bilaterally.  Neurologic  Senn-Weinstein monofilament wire test within normal limits  bilaterally. Muscle power within normal limits bilaterally.  Nails Thick disfigured discolored nails with subungual debris  from hallux to fifth toes bilaterally.    Orthopedic  No limitations of motion  feet .  No crepitus or effusions noted.  No bony pathology or digital deformities noted.  Skin  normotropic skin with no porokeratosis noted bilaterally.  No signs of infections or ulcers noted.     Onychomycosis  Pain in right toes  Pain in left toes     Consent was obtained for treatment procedures.   Mechanical debridement of nails 1-5  bilaterally performed with a nail nipper.  Filed with dremel without incident.    Return office visit   10 weeks                  Told patient to return for periodic foot care and evaluation due to potential at risk complications.   Gardiner Barefoot DPM

## 2021-12-22 ENCOUNTER — Telehealth (HOSPITAL_BASED_OUTPATIENT_CLINIC_OR_DEPARTMENT_OTHER): Payer: Self-pay | Admitting: Cardiovascular Disease

## 2021-12-22 NOTE — Telephone Encounter (Signed)
Patient wants to stay at [facility] and would like to see Dr. Stanford Breed ?

## 2022-01-06 ENCOUNTER — Encounter: Payer: Self-pay | Admitting: Podiatry

## 2022-01-06 ENCOUNTER — Ambulatory Visit: Payer: Medicare Other | Admitting: Podiatry

## 2022-01-06 DIAGNOSIS — N200 Calculus of kidney: Secondary | ICD-10-CM

## 2022-01-06 DIAGNOSIS — M79674 Pain in right toe(s): Secondary | ICD-10-CM | POA: Diagnosis not present

## 2022-01-06 DIAGNOSIS — B351 Tinea unguium: Secondary | ICD-10-CM | POA: Diagnosis not present

## 2022-01-06 DIAGNOSIS — M79675 Pain in left toe(s): Secondary | ICD-10-CM

## 2022-01-06 DIAGNOSIS — L608 Other nail disorders: Secondary | ICD-10-CM

## 2022-01-06 NOTE — Progress Notes (Signed)
This patient returns to my office for at risk foot care.  This patient requires this care by a professional since this patient will be at risk due to having history of DVT.    This patient is unable to cut nails himself since the patient cannot reach his nails.These nails are painful walking and wearing shoes.  This patient presents for at risk foot care today.  ? ?General Appearance  Alert, conversant and in no acute stress. ? ?Vascular  Dorsalis pedis and posterior tibial  pulses are palpable  bilaterally.  Capillary return is within normal limits  bilaterally. Temperature is within normal limits  bilaterally. ? ?Neurologic  Senn-Weinstein monofilament wire test within normal limits  bilaterally. Muscle power within normal limits bilaterally. ? ?Nails Thick disfigured discolored nails with subungual debris  from hallux to fifth toes bilaterally.   ? ?Orthopedic  No limitations of motion  feet .  No crepitus or effusions noted.  No bony pathology or digital deformities noted. ? ?Skin  normotropic skin with no porokeratosis noted bilaterally.  No signs of infections or ulcers noted.    ? ?Onychomycosis  Pain in right toes  Pain in left toes    ? ?Consent was obtained for treatment procedures.   Mechanical debridement of nails 1-5  bilaterally performed with a nail nipper.  Filed with dremel without incident.  ? ? ?Return office visit   10 weeks                  Told patient to return for periodic foot care and evaluation due to potential at risk complications. ? ? ?Gardiner Barefoot DPM  ?

## 2022-01-13 DIAGNOSIS — H26493 Other secondary cataract, bilateral: Secondary | ICD-10-CM | POA: Diagnosis not present

## 2022-01-13 DIAGNOSIS — H35371 Puckering of macula, right eye: Secondary | ICD-10-CM | POA: Diagnosis not present

## 2022-01-13 DIAGNOSIS — Z961 Presence of intraocular lens: Secondary | ICD-10-CM | POA: Diagnosis not present

## 2022-01-13 DIAGNOSIS — H40013 Open angle with borderline findings, low risk, bilateral: Secondary | ICD-10-CM | POA: Diagnosis not present

## 2022-02-16 DIAGNOSIS — H524 Presbyopia: Secondary | ICD-10-CM | POA: Diagnosis not present

## 2022-03-17 ENCOUNTER — Ambulatory Visit: Payer: Medicare Other | Admitting: Podiatry

## 2022-03-17 ENCOUNTER — Encounter: Payer: Self-pay | Admitting: Podiatry

## 2022-03-17 DIAGNOSIS — M79674 Pain in right toe(s): Secondary | ICD-10-CM

## 2022-03-17 DIAGNOSIS — M79675 Pain in left toe(s): Secondary | ICD-10-CM | POA: Diagnosis not present

## 2022-03-17 DIAGNOSIS — B351 Tinea unguium: Secondary | ICD-10-CM

## 2022-03-17 DIAGNOSIS — L608 Other nail disorders: Secondary | ICD-10-CM

## 2022-03-17 NOTE — Progress Notes (Signed)
This patient returns to my office for at risk foot care.  This patient requires this care by a professional since this patient will be at risk due to having history of DVT.    This patient is unable to cut nails himself since the patient cannot reach his nails.These nails are painful walking and wearing shoes.  This patient presents for at risk foot care today.   General Appearance  Alert, conversant and in no acute stress.  Vascular  Dorsalis pedis and posterior tibial  pulses are palpable  bilaterally.  Capillary return is within normal limits  bilaterally. Temperature is within normal limits  bilaterally.  Neurologic  Senn-Weinstein monofilament wire test within normal limits  bilaterally. Muscle power within normal limits bilaterally.  Nails Thick disfigured discolored nails with subungual debris  from hallux to fifth toes bilaterally.    Orthopedic  No limitations of motion  feet .  No crepitus or effusions noted.  No bony pathology or digital deformities noted.  Skin  normotropic skin with no porokeratosis noted bilaterally.  No signs of infections or ulcers noted.     Onychomycosis  Pain in right toes  Pain in left toes     Consent was obtained for treatment procedures.   Mechanical debridement of nails 1-5  bilaterally performed with a nail nipper.  Filed with dremel without incident.    Return office visit   10 weeks                  Told patient to return for periodic foot care and evaluation due to potential at risk complications.   Gardiner Barefoot DPM

## 2022-04-01 DIAGNOSIS — R7989 Other specified abnormal findings of blood chemistry: Secondary | ICD-10-CM | POA: Diagnosis not present

## 2022-04-01 DIAGNOSIS — Z125 Encounter for screening for malignant neoplasm of prostate: Secondary | ICD-10-CM | POA: Diagnosis not present

## 2022-04-01 DIAGNOSIS — I10 Essential (primary) hypertension: Secondary | ICD-10-CM | POA: Diagnosis not present

## 2022-04-08 DIAGNOSIS — I129 Hypertensive chronic kidney disease with stage 1 through stage 4 chronic kidney disease, or unspecified chronic kidney disease: Secondary | ICD-10-CM | POA: Diagnosis not present

## 2022-04-08 DIAGNOSIS — Z0000 Encounter for general adult medical examination without abnormal findings: Secondary | ICD-10-CM | POA: Diagnosis not present

## 2022-04-08 DIAGNOSIS — I10 Essential (primary) hypertension: Secondary | ICD-10-CM | POA: Diagnosis not present

## 2022-04-08 DIAGNOSIS — R82998 Other abnormal findings in urine: Secondary | ICD-10-CM | POA: Diagnosis not present

## 2022-04-08 DIAGNOSIS — N1831 Chronic kidney disease, stage 3a: Secondary | ICD-10-CM | POA: Diagnosis not present

## 2022-04-25 NOTE — Progress Notes (Signed)
HPI: Follow-up hypertension and history of prior pulmonary embolus.  Previously followed by Dr. Oval Linsey but transitioning to me.  Nuclear study February 2017 showed EF 65 and fixed defect but no ischemia.  Echocardiogram at that time showed normal LV function, grade 1 diastolic dysfunction and mild mitral regurgitation.  He has had a CPX to evaluate dyspnea previously and this is felt to be due to a combination of diastolic dysfunction, obesity and deconditioning.  Note calcium blockers have caused peripheral edema previously.  Since last seen the patient has dyspnea with more extreme activities but not with routine activities. It is relieved with rest. It is not associated with chest pain. There is no orthopnea, PND or pedal edema. There is no syncope or palpitations. There is no exertional chest pain.   Current Outpatient Medications  Medication Sig Dispense Refill   acetaminophen (TYLENOL) 500 MG tablet Take 1,000 mg by mouth daily as needed for moderate pain or headache.     aspirin 81 MG tablet Take 81 mg by mouth every evening.      fluticasone (FLONASE) 50 MCG/ACT nasal spray Place 2 sprays into the nose at bedtime as needed for allergies.      hydrALAZINE (APRESOLINE) 50 MG tablet Take 1 tablet (50 mg total) by mouth 2 (two) times daily. May also take 1 tablet (50 mg total) daily as needed (for systolic blood pressure above 150). 180 tablet 3   HYDROcodone-acetaminophen (NORCO/VICODIN) 5-325 MG tablet      methocarbamol (ROBAXIN) 500 MG tablet Take 1 tablet (500 mg total) by mouth every 6 (six) hours as needed. 40 tablet 1   metoCLOPramide (REGLAN) 5 MG tablet Take 5 mg by mouth at bedtime.      naproxen sodium (ALEVE) 220 MG tablet Take 220 mg by mouth daily as needed.     omeprazole (PRILOSEC) 40 MG capsule Take 40 mg by mouth every evening.     predniSONE (DELTASONE) 50 MG tablet Take one tablet daily for 5 days. 5 tablet 0   Probiotic Product (PROBIOTIC ADVANCED PO) Take 1  capsule by mouth at bedtime.      SENNA-S 8.6-50 MG tablet TK 1 T PO BID PRN     therapeutic multivitamin-minerals (THERAGRAN-M) tablet Take 1 tablet by mouth daily.     valsartan-hydrochlorothiazide (DIOVAN-HCT) 320-12.5 MG tablet Take 1 tablet by mouth daily.     Current Facility-Administered Medications  Medication Dose Route Frequency Provider Last Rate Last Admin   betamethasone acetate-betamethasone sodium phosphate (CELESTONE) injection 12 mg  12 mg Other Once Magnus Sinning, MD         Past Medical History:  Diagnosis Date   Arthritis    Chest pain 10/02/2015   Chronic kidney disease    hx kidney stones   GERD (gastroesophageal reflux disease)    H/O hiatal hernia    Hypertension    Wears glasses     Past Surgical History:  Procedure Laterality Date   25 GAUGE PARS PLANA VITRECTOMY WITH 20 GAUGE MVR PORT Right 09/06/2016   Procedure: 25 GAUGE PARS PLANA VITRECTOMY WITH 20 GAUGE MVR PORT;  Surgeon: [name], MD;  Location: [place];  Service: Ophthalmology;  Laterality: Right;   [place]   lumbar lam   CARPAL TUNNEL RELEASE  12/06/2011   Procedure: CARPAL TUNNEL RELEASE;  Surgeon: Wynonia Sours, MD;  Location: [place];  Service: Orthopedics;  Laterality: Right;  CARPAL TUNNEL RELEASE  08/07/2012   Procedure: CARPAL TUNNEL RELEASE;  Surgeon: Wynonia Sours, MD;  Location: [place];  Service: Orthopedics;  Laterality: Left;  carpal tunnel release   CHOLECYSTECTOMY  1976   COLONOSCOPY     JOINT REPLACEMENT  4/12   lt total knee   KNEE ARTHROSCOPY     rt and lt   KNEE CLOSED REDUCTION  6/12   lt -after lt total knee   TRIGGER FINGER RELEASE  12/06/2011   Procedure: RELEASE TRIGGER FINGER/A-1 PULLEY;  Surgeon: Wynonia Sours, MD;  Location: [place];  Service: Orthopedics;  Laterality: Right;  right thumb, middle, and ring fingers   TRIGGER FINGER RELEASE  08/07/2012   Procedure: RELEASE  TRIGGER FINGER/A-1 PULLEY;  Surgeon: Wynonia Sours, MD;  Location: [place];  Service: Orthopedics;  Laterality: Left;  trigger release left ring finger    Social History   Socioeconomic History   Marital status: Married    Spouse name: Not on file   Number of children: Not on file   Years of education: Not on file   Highest education level: Not on file  Occupational History   Occupation: retired  Tobacco Use   Smoking status: Former    Packs/day: 1.00    Years: 40.00    Total pack years: 40.00    Types: Cigarettes    Quit date: 11/30/1994    Years since quitting: 27.4   Smokeless tobacco: Never  Vaping Use   Vaping Use: Never used  Substance and Sexual Activity   Alcohol use: No    Alcohol/week: 0.0 standard drinks of alcohol   Drug use: No   Sexual activity: Not on file  Other Topics Concern   Not on file  Social History Narrative   Epworth Sleepiness Scale = 7 (as of 10/02/2015)   Social Determinants of Health   Financial Resource Strain: Not on file  Food Insecurity: Not on file  Transportation Needs: Not on file  Physical Activity: Not on file  Stress: Not on file  Social Connections: Not on file  Intimate Partner Violence: Not on file    Family History  Problem Relation Age of Onset   Stroke Mother    Cancer Father    Hypertension Child    Hypertension Child     ROS: Arthralgias but no fevers or chills, productive cough, hemoptysis, dysphasia, odynophagia, melena, hematochezia, dysuria, hematuria, rash, seizure activity, orthopnea, PND, pedal edema, claudication. Remaining systems are negative.  Physical Exam: Well-developed well-nourished in no acute distress.  Skin is warm and dry.  HEENT is normal.  Neck is supple.  Chest is clear to auscultation with normal expansion.  Cardiovascular exam is regular rate and rhythm.  Abdominal exam nontender or distended. No masses palpated.  Positive bruit Extremities show no edema. neuro  grossly intact  ECG-normal sinus rhythm at a rate of 71, first-degree AV block, left axis deviation.  Personally reviewed  A/P  1 hypertension-blood pressure mildly elevated but he follows this closely at home and it is typically controlled.  Continue present medications and follow.  2 bruit-schedule abdominal ultrasound to rule out aneurysm.  Kirk Ruths, MD

## 2022-05-02 ENCOUNTER — Ambulatory Visit: Payer: Medicare Other | Admitting: Orthopaedic Surgery

## 2022-05-02 ENCOUNTER — Encounter: Payer: Self-pay | Admitting: Orthopaedic Surgery

## 2022-05-02 DIAGNOSIS — M542 Cervicalgia: Secondary | ICD-10-CM

## 2022-05-02 MED ORDER — METHOCARBAMOL 500 MG PO TABS: 500.0000 mg | ORAL_TABLET | Freq: Four times a day (QID) | ORAL | 1 refills | Status: DC | PRN

## 2022-05-02 MED ORDER — PREDNISONE 50 MG PO TABS: ORAL_TABLET | ORAL | 0 refills | Status: DC

## 2022-05-02 NOTE — Progress Notes (Signed)
The patient is an 81 year old gentleman who comes in with significant neck pain with radicular symptoms going down both shoulder areas but much worse on the right in the parascapular area and down his right arm.  He reports that when he turns his head certain ways to the right side it does reproduce the numbness and tingling and pain.  He had a mechanical fall back in 2020 but then over the last 2 months he started to have a flareup of pain.  He did have a CT scan of the cervical spine back in 2020 but he has not had an MRI.  He does have a positive Spurling sign to the right side.  There is weakness in the right shoulder and the left shoulder but some of this is related to previous rotator cuff deficiency.  However he does have significant radicular symptoms with parascapular pain on the right side it does radiate down into his right arm and hand.  He is not a diabetic.  I would like to obtain a MRI of his cervical spine at this standpoint to assess for nerve compression based on his clinical exam and radicular findings and signs and symptoms.  I will try 5 days of prednisone 50 mg as well as methocarbamol.  He agrees with this treatment plan.  We will see him back after this MRI.  All questions and concerns were answered and addressed.

## 2022-05-03 ENCOUNTER — Other Ambulatory Visit: Payer: Self-pay

## 2022-05-03 DIAGNOSIS — M542 Cervicalgia: Secondary | ICD-10-CM

## 2022-05-05 ENCOUNTER — Ambulatory Visit: Payer: Medicare Other | Admitting: Cardiology

## 2022-05-05 ENCOUNTER — Encounter: Payer: Self-pay | Admitting: Cardiology

## 2022-05-05 VITALS — BP 144/84 | HR 71 | Ht 68.0 in | Wt 180.2 lb

## 2022-05-05 DIAGNOSIS — R0989 Other specified symptoms and signs involving the circulatory and respiratory systems: Secondary | ICD-10-CM

## 2022-05-05 DIAGNOSIS — I10 Essential (primary) hypertension: Secondary | ICD-10-CM | POA: Diagnosis not present

## 2022-05-05 NOTE — Patient Instructions (Signed)
    Testing/Procedures:  Your physician has requested that you have an abdominal aorta duplex. During this test, an ultrasound is used to evaluate the aorta. Allow 30 minutes for this exam. Do not eat after midnight the day before and avoid carbonated beverages [facility]   Follow-Up: At [facility], you and your health needs are our priority.  As part of our continuing mission to provide you with exceptional heart care, we have created designated Provider Care Teams.  These Care Teams include your primary Cardiologist (physician) and Advanced Practice Providers (APPs -  Physician Assistants and Nurse Practitioners) who all work together to provide you with the care you need, when you need it.  We recommend signing up for the patient portal called "MyChart".  Sign up information is provided on this After Visit Summary.  MyChart is used to connect with patients for Virtual Visits (Telemedicine).  Patients are able to view lab/test results, encounter notes, upcoming appointments, etc.  Non-urgent messages can be sent to your provider as well.   To learn more about what you can do with MyChart, go to [email or web address].    Your next appointment:   12 month(s)  The format for your next appointment:   In Person  Provider:   Kirk Ruths MD

## 2022-05-12 ENCOUNTER — Ambulatory Visit
Admission: RE | Admit: 2022-05-12 | Discharge: 2022-05-12 | Disposition: A | Discharge status: Home / Self Care | Payer: Medicare Other | Source: Ambulatory Visit | Attending: Orthopaedic Surgery | Admitting: Orthopaedic Surgery

## 2022-05-12 DIAGNOSIS — M542 Cervicalgia: Secondary | ICD-10-CM

## 2022-05-12 DIAGNOSIS — M4802 Spinal stenosis, cervical region: Secondary | ICD-10-CM | POA: Diagnosis not present

## 2022-05-24 ENCOUNTER — Other Ambulatory Visit: Payer: Self-pay

## 2022-05-24 ENCOUNTER — Ambulatory Visit: Payer: Medicare Other | Admitting: Orthopaedic Surgery

## 2022-05-24 ENCOUNTER — Encounter: Payer: Self-pay | Admitting: Orthopaedic Surgery

## 2022-05-24 DIAGNOSIS — M542 Cervicalgia: Secondary | ICD-10-CM

## 2022-05-24 MED ORDER — METHOCARBAMOL 500 MG PO TABS: 500.0000 mg | ORAL_TABLET | Freq: Four times a day (QID) | ORAL | 1 refills | Status: DC | PRN

## 2022-05-24 NOTE — Progress Notes (Signed)
HPI: Mr. Schurman returns today to go over the MRI of his C-spine.  He states that the prednisone Dosepak and muscle relaxants greatly helped his no longer having any radicular symptoms down either arm.  Still has some pain in his neck with certain movements. MRI images reviewed with the patient.  MRI is dated 05/12/2022 and shows multifactorial changes throughout the C-spine most notably with spinal stenosis at C5-C6 which is moderate.  Also at C5-C6 moderate bilateral C6 foraminal stenosis.  Foraminal stenosis is severe at C3-C4 on the right and moderate on the left.  C4-C5 with severe left and mild right foraminal stenosis.  Impression: Cervical stenosis  Plan: Given the fact that he has responded well to conservative measures which were Medrol Dosepak and muscle relaxants recommend physical therapy for his neck this will be for range of motion: Stretching: Home exercise program and modalities.  He will follow-up with [place] pain persist or becomes worse.  Questions were encouraged and answered by Dr. Ninfa Linden myself.

## 2022-06-02 ENCOUNTER — Ambulatory Visit: Payer: Medicare Other | Admitting: Podiatry

## 2022-06-03 ENCOUNTER — Ambulatory Visit (HOSPITAL_COMMUNITY)
Admission: RE | Admit: 2022-06-03 | Discharge: 2022-06-03 | Disposition: A | Discharge status: Home / Self Care | Payer: Medicare Other | Source: Ambulatory Visit | Attending: Cardiology | Admitting: Cardiology

## 2022-06-03 DIAGNOSIS — Z87891 Personal history of nicotine dependence: Secondary | ICD-10-CM | POA: Insufficient documentation

## 2022-06-03 DIAGNOSIS — R0989 Other specified symptoms and signs involving the circulatory and respiratory systems: Secondary | ICD-10-CM | POA: Insufficient documentation

## 2022-06-03 DIAGNOSIS — Z136 Encounter for screening for cardiovascular disorders: Secondary | ICD-10-CM | POA: Insufficient documentation

## 2022-06-09 ENCOUNTER — Ambulatory Visit: Payer: Medicare Other | Admitting: Podiatry

## 2022-06-09 ENCOUNTER — Encounter: Payer: Self-pay | Admitting: Podiatry

## 2022-06-09 DIAGNOSIS — B351 Tinea unguium: Secondary | ICD-10-CM

## 2022-06-09 DIAGNOSIS — L608 Other nail disorders: Secondary | ICD-10-CM | POA: Diagnosis not present

## 2022-06-09 DIAGNOSIS — N200 Calculus of kidney: Secondary | ICD-10-CM

## 2022-06-09 DIAGNOSIS — M79674 Pain in right toe(s): Secondary | ICD-10-CM

## 2022-06-09 DIAGNOSIS — M79675 Pain in left toe(s): Secondary | ICD-10-CM

## 2022-06-09 NOTE — Progress Notes (Signed)
This patient returns to my office for at risk foot care.  This patient requires this care by a professional since this patient will be at risk due to having history of DVT.    This patient is unable to cut nails himself since the patient cannot reach his nails.These nails are painful walking and wearing shoes.  This patient presents for at risk foot care today.   General Appearance  Alert, conversant and in no acute stress.  Vascular  Dorsalis pedis and posterior tibial  pulses are palpable  bilaterally.  Capillary return is within normal limits  bilaterally. Temperature is within normal limits  bilaterally.  Neurologic  Senn-Weinstein monofilament wire test within normal limits  bilaterally. Muscle power within normal limits bilaterally.  Nails Thick disfigured discolored nails with subungual debris  from hallux to fifth toes bilaterally.    Orthopedic  No limitations of motion  feet .  No crepitus or effusions noted.  No bony pathology or digital deformities noted.  Skin  normotropic skin with no porokeratosis noted bilaterally.  No signs of infections or ulcers noted.     Onychomycosis  Pain in right toes  Pain in left toes     Consent was obtained for treatment procedures.   Mechanical debridement of nails 1-5  bilaterally performed with a nail nipper.  Filed with dremel without incident.    Return office visit   9   weeks                  Told patient to return for periodic foot care and evaluation due to potential at risk complications.   Dereon Williamsen DPM  

## 2022-06-10 ENCOUNTER — Encounter: Payer: Self-pay | Admitting: Rehabilitative and Restorative Service Providers"

## 2022-06-10 ENCOUNTER — Ambulatory Visit: Payer: Medicare Other | Admitting: Rehabilitative and Restorative Service Providers"

## 2022-06-10 DIAGNOSIS — M25511 Pain in right shoulder: Secondary | ICD-10-CM

## 2022-06-10 DIAGNOSIS — R293 Abnormal posture: Secondary | ICD-10-CM

## 2022-06-10 DIAGNOSIS — M6281 Muscle weakness (generalized): Secondary | ICD-10-CM | POA: Diagnosis not present

## 2022-06-10 DIAGNOSIS — M25512 Pain in left shoulder: Secondary | ICD-10-CM | POA: Diagnosis not present

## 2022-06-10 DIAGNOSIS — M542 Cervicalgia: Secondary | ICD-10-CM

## 2022-06-10 NOTE — Therapy (Signed)
OUTPATIENT PHYSICAL THERAPY CERVICAL/SHOULDER EVALUATION   Patient Name: Frank Johnston MRN: [id number] DOB:April 30, 1941, 81 y.o., male Today's Date: 06/10/2022   PT End of Session - 06/10/22 1440     Visit Number 1    Number of Visits 16    PT Start Time 9024    PT Stop Time 1435    PT Time Calculation (min) 50 min    Activity Tolerance Patient tolerated treatment well;No increased pain    Behavior During Therapy [facility] for tasks assessed/performed             Past Medical History:  Diagnosis Date   Arthritis    Chest pain 10/02/2015   Chronic kidney disease    hx kidney stones   GERD (gastroesophageal reflux disease)    H/O hiatal hernia    Hypertension    Wears glasses    Past Surgical History:  Procedure Laterality Date   25 GAUGE PARS PLANA VITRECTOMY WITH 20 GAUGE MVR PORT Right 09/06/2016   Procedure: 25 GAUGE PARS PLANA VITRECTOMY WITH 20 GAUGE MVR PORT;  Surgeon: Frank Pedro, MD;  Location: [place];  Service: Ophthalmology;  Laterality: Right;   [place]   lumbar lam   CARPAL TUNNEL RELEASE  12/06/2011   Procedure: CARPAL TUNNEL RELEASE;  Surgeon: Frank Sours, MD;  Location: [place];  Service: Orthopedics;  Laterality: Right;   CARPAL TUNNEL RELEASE  08/07/2012   Procedure: CARPAL TUNNEL RELEASE;  Surgeon: Frank Sours, MD;  Location: [place];  Service: Orthopedics;  Laterality: Left;  carpal tunnel release   CHOLECYSTECTOMY  1976   COLONOSCOPY     JOINT REPLACEMENT  4/12   lt total knee   KNEE ARTHROSCOPY     rt and lt   KNEE CLOSED REDUCTION  6/12   lt -after lt total knee   TRIGGER FINGER RELEASE  12/06/2011   Procedure: RELEASE TRIGGER FINGER/A-1 PULLEY;  Surgeon: Frank Sours, MD;  Location: [place];  Service: Orthopedics;  Laterality: Right;  right thumb, middle, and ring fingers   TRIGGER FINGER RELEASE  08/07/2012   Procedure: RELEASE TRIGGER FINGER/A-1 PULLEY;   Surgeon: Frank Sours, MD;  Location: [place];  Service: Orthopedics;  Laterality: Left;  trigger release left ring finger   Patient Active Problem List   Diagnosis Date Noted   Surgery follow-up examination 01/18/2021   Pain due to onychomycosis of toenails of both feet 04/04/2019   History of adenomatous polyp of colon 01/08/2018   Preretinal fibrosis, right eye 09/06/2016   Dyspnea 02/04/2016   Chest pain 10/02/2015   DVT (deep venous thrombosis) ([place]) 03/02/2014   Pulmonary embolism on left ([place]) 02/28/2014   Knee pain, right 02/28/2014   PE (pulmonary embolism) 02/28/2014   Essential hypertension 01/19/2008   DYSPEPSIA 01/19/2008   ILEUS 01/19/2008   RENAL CALCULUS 01/19/2008   ARTHRITIS 01/19/2008   COLONIC POLYPS 08/01/2005   INTERNAL HEMORRHOIDS 08/01/2005    PCP: Frank Lopes, MD  REFERRING PROVIDER: Jean Rosenthal, MD  REFERRING DIAG: M54.2 (ICD-10-CM) - Cervicalgia   THERAPY DIAG:  Abnormal posture - Plan: PT plan of care cert/re-cert  Muscle weakness (generalized) - Plan: PT plan of care cert/re-cert  Cervicalgia - Plan: PT plan of care cert/re-cert  Left shoulder pain, unspecified chronicity - Plan: PT plan of care cert/re-cert  Right shoulder pain, unspecified chronicity - Plan: PT plan of care cert/re-cert  Rationale for Evaluation and Treatment Rehabilitation  ONSET DATE: Chronic  SUBJECTIVE:                                                                                                                                                                                                         SUBJECTIVE STATEMENT: Frank Johnston has had increased neck and scapular pain over the past several weeks.  He has difficulty sleeping, mowing his yard and doing other chores around the house.  He would like to decrease pain, move better, and improve his posture.  PERTINENT HISTORY:  OA, chronic kidney disease, HTN, previous lumbar laminectomy,  previous L TKA, R knee scope, B CT releases  PAIN:  Are you having pain? Yes: NPRS scale: 3-6/10 Pain location: Neck, R > L scapulae and into chest Pain description: Stabbing, ache Aggravating factors: Overhead function, lifting Relieving factors: Hydrocodone  PRECAUTIONS: Other: Overall arthritis  WEIGHT BEARING RESTRICTIONS No  FALLS:  Has patient fallen in last 6 months? 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 Multilevel cervical spondylosis with resultant diffuse spinal stenosis at C3-4 through C5-6, most pronounced at C5-6 where stenosis is moderate in nature. 2. Multifactorial degenerative changes with resultant multilevel foraminal narrowing as above. Notable findings include severe right with moderate left C4 foraminal stenosis, severe left C5 foraminal narrowing, with moderate bilateral C6 foraminal stenosis. 3. Multilevel facet arthrosis, most pronounced on the left at C4-5 and bilaterally at C7-T1 where there is associated reactive marrow edema. Findings could contribute to neck pain.    PATIENT SURVEYS:  FOTO 37 (Goal 57) in 12 visits   COGNITION: Overall cognitive status: Within functional limits for tasks assessed   SENSATION: Frank Johnston notes R > L pain to the elbow, may be impingement at the shoulder  POSTURE: rounded shoulders, forward head, and decreased lumbar lordosis   CERVICAL ROM:   Active ROM A/PROM (deg) eval  Flexion   Extension 30  Right lateral flexion   Left lateral flexion   Right rotation 25  Left rotation 40   (Blank rows = not tested)  UPPER EXTREMITY ROM:  Passive ROM Right eval Left eval  Shoulder flexion 145 135  Shoulder extension     Shoulder abduction    Shoulder horizontal adduction 40 30  Shoulder extension  Shoulder internal rotation 30 20  Shoulder external rotation 75 70  Elbow flexion    Elbow extension    Wrist flexion    Wrist extension    Wrist ulnar deviation    Wrist radial deviation    Wrist pronation    Wrist supination     (Blank rows = not tested)  Strength (in pounds assessed with hand held dynamometer):  MMT Right eval Left eval  Shoulder flexion    Shoulder extension    Shoulder abduction    Cervical lateral bending 8.4 13.2  Cervical extension 18.7   Shoulder internal rotation 18.9 18.8  Shoulder external rotation 17.3 5.4  Middle trapezius    Lower trapezius    Elbow flexion    Elbow extension    Wrist flexion    Wrist extension    Wrist ulnar deviation    Wrist radial deviation    Wrist pronation    Wrist supination    Grip strength     (Blank rows = not tested)  TODAY'S TREATMENT:  06/10/2022 Shoulder blade pinches 10X 5 seconds Scapular protraction 20X 3 seconds Theraband ER shoulder Yellow 10X Bil Cervical Extension Isometrics into a pillow seated 10X 5 seconds   PATIENT EDUCATION:  Education details: Reviewed imaging and HEP Person educated: Patient Education method: Explanation, Demonstration, Tactile cues, Verbal cues, and Handouts Education comprehension: verbalized understanding, returned demonstration, verbal cues required, tactile cues required, and needs further education   HOME EXERCISE PROGRAM: Access Code: [email or web address] URL: [email or web address] Date: 06/10/2022 Prepared by: Vista Mink  Exercises - Supine Scapular Protraction in Flexion with Dumbbells  - 2 x daily - 7 x weekly - 1 sets - 20 reps - 3 seconds hold - Standing Scapular Retraction  - 5 x daily - 7 x weekly - 1 sets - 5 reps - 5 second hold - Standing Isometric Cervical Extension with Manual Resistance  - 5 x daily - 7 x weekly - 1 sets - 5 reps - 5 hold - Shoulder  External Rotation with Anchored Resistance  - 1 x daily - 7 x weekly - 2 sets - 10 reps - 3 hold  ASSESSMENT:  CLINICAL IMPRESSION: Patient is a 81 y.o. male who was seen today for physical therapy evaluation and treatment for cervicalgia.  Postural abnormalities, weak cervical extensors, a stiff neck, limited Bil shoulder capsule flexibility and weak scapular and rotator cuff muscles.  All noted impairments are contributing to his pain and disability.  Eliceo's prognosis to improve in the above listed areas (including pain and function) is good with the recommended plan of care.   OBJECTIVE IMPAIRMENTS decreased activity tolerance, decreased endurance, decreased knowledge of condition, decreased ROM, decreased strength, decreased safety awareness, impaired perceived functional ability, impaired flexibility, impaired UE functional use, improper body mechanics, postural dysfunction, and pain.   ACTIVITY LIMITATIONS carrying, lifting, bending, sitting, standing, squatting, sleeping, bed mobility, dressing, and reach over head  PARTICIPATION LIMITATIONS: meal prep, cleaning, driving, community activity, and yard work  PERSONAL FACTORS OA, chronic kidney disease, HTN, previous lumbar laminectomy, previous L TKA, R knee scope, and B CT releases are also affecting patient's functional outcome.   REHAB POTENTIAL: Good  CLINICAL DECISION MAKING: Stable/uncomplicated  EVALUATION COMPLEXITY: Moderate   GOALS: Goals reviewed with patient? Yes  SHORT TERM GOALS: Target date: 07/08/2022   Improve cervical AROM for extension to 50 degrees and rotation to 50/50 degrees Baseline: 30 and 25/40 degrees Goal status: INITIAL  2.  Improve cervical extension strength to  at least 28 pounds Baseline: 18.7 pounds Goal status: INITIAL  3.  Improve shoulder AROM for ER & IR to 80/80 and 50/50 degrees Bil Baseline: 70/75 and 20/30 degrees Goal status: INITIAL  4.  Improve shoulder flexion AROM to 150  degrees Bil Baseline: 135/145 Goal status: INITIAL   LONG TERM GOALS: Target date: 08/05/2022  Improve FOTO to 57 Baseline: 37 Goal status: INITIAL  2.  Improve cervical and scapular pain to consistently 0-3/10 on the Numeric Pain Rating Scale Baseline: 3-6/10 Goal status: INITIAL  3.  Improve cervical extensors and lateral bending strength to 35 and 18/18 pounds Baseline: 18.7 and 13.2/8.4 pounds Goal status: INITIAL  4.  Improve shoulder ER strength to 20 pounds and IR strength to 25 pounds Bil Baseline: 5.4/17.3 pounds and 18.8/18.9 pounds Goal status: INITIAL  5.  C[place] will be independent with his long-term HEP at DC Baseline: Started 06/10/2022 Goal status: INITIAL   PLAN: PT FREQUENCY: 2x/week  PT DURATION: 8 weeks  PLANNED INTERVENTIONS: Therapeutic exercises, Therapeutic activity, Patient/Family education, Self Care, Joint mobilization, Cryotherapy, Traction, and Manual therapy  PLAN FOR NEXT SESSION: Review HEP, postural correction and strengthening (probable L RTC tear), scapular , RTC and cervical strengthening.  Cervical rotation AROM and shoulder capsular stretching.   Farley Ly, PT, MPT 06/10/2022, 3:01 PM

## 2022-06-13 ENCOUNTER — Ambulatory Visit: Payer: Medicare Other | Admitting: Physical Therapy

## 2022-06-13 ENCOUNTER — Encounter: Payer: Self-pay | Admitting: Physical Therapy

## 2022-06-13 DIAGNOSIS — M25512 Pain in left shoulder: Secondary | ICD-10-CM

## 2022-06-13 DIAGNOSIS — M6281 Muscle weakness (generalized): Secondary | ICD-10-CM | POA: Diagnosis not present

## 2022-06-13 DIAGNOSIS — M542 Cervicalgia: Secondary | ICD-10-CM

## 2022-06-13 DIAGNOSIS — M25511 Pain in right shoulder: Secondary | ICD-10-CM

## 2022-06-13 DIAGNOSIS — R293 Abnormal posture: Secondary | ICD-10-CM

## 2022-06-13 NOTE — Therapy (Signed)
OUTPATIENT PHYSICAL THERAPY TREATMENT NOTE   Patient Name: Frank Johnston MRN: [id number] DOB:Feb 29, 1941, 81 y.o., male Today's Date: 06/13/2022  PCP: [name], MD REFERRING PROVIDER: Jean Rosenthal, MD  END OF SESSION:   PT End of Session - 06/13/22 1622     Visit Number 2    Number of Visits 16    PT Start Time 1520    PT Stop Time 3875    PT Time Calculation (min) 48 min    Activity Tolerance Patient tolerated treatment well;No increased pain    Behavior During Therapy [facility] for tasks assessed/performed             Past Medical History:  Diagnosis Date   Arthritis    Chest pain 10/02/2015   Chronic kidney disease    hx kidney stones   GERD (gastroesophageal reflux disease)    H/O hiatal hernia    Hypertension    Wears glasses    Past Surgical History:  Procedure Laterality Date   25 GAUGE PARS PLANA VITRECTOMY WITH 20 GAUGE MVR PORT Right 09/06/2016   Procedure: 25 GAUGE PARS PLANA VITRECTOMY WITH 20 GAUGE MVR PORT;  Surgeon: [name], MD;  Location: [place];  Service: Ophthalmology;  Laterality: Right;   [place]   lumbar lam   CARPAL TUNNEL RELEASE  12/06/2011   Procedure: CARPAL TUNNEL RELEASE;  Surgeon: Wynonia Sours, MD;  Location: [place];  Service: Orthopedics;  Laterality: Right;   CARPAL TUNNEL RELEASE  08/07/2012   Procedure: CARPAL TUNNEL RELEASE;  Surgeon: Wynonia Sours, MD;  Location: [place];  Service: Orthopedics;  Laterality: Left;  carpal tunnel release   CHOLECYSTECTOMY  1976   COLONOSCOPY     JOINT REPLACEMENT  4/12   lt total knee   KNEE ARTHROSCOPY     rt and lt   KNEE CLOSED REDUCTION  6/12   lt -after lt total knee   TRIGGER FINGER RELEASE  12/06/2011   Procedure: RELEASE TRIGGER FINGER/A-1 PULLEY;  Surgeon: Wynonia Sours, MD;  Location: [place];  Service: Orthopedics;  Laterality: Right;  right thumb, middle, and ring fingers   TRIGGER  FINGER RELEASE  08/07/2012   Procedure: RELEASE TRIGGER FINGER/A-1 PULLEY;  Surgeon: Wynonia Sours, MD;  Location: [place];  Service: Orthopedics;  Laterality: Left;  trigger release left ring finger   Patient Active Problem List   Diagnosis Date Noted   Surgery follow-up examination 01/18/2021   Pain due to onychomycosis of toenails of both feet 04/04/2019   History of adenomatous polyp of colon 01/08/2018   Preretinal fibrosis, right eye 09/06/2016   Dyspnea 02/04/2016   Chest pain 10/02/2015   DVT (deep venous thrombosis) ([place]) 03/02/2014   Pulmonary embolism on left ([place]) 02/28/2014   Knee pain, right 02/28/2014   PE (pulmonary embolism) 02/28/2014   Essential hypertension 01/19/2008   DYSPEPSIA 01/19/2008   ILEUS 01/19/2008   RENAL CALCULUS 01/19/2008   ARTHRITIS 01/19/2008   COLONIC POLYPS 08/01/2005   INTERNAL HEMORRHOIDS 08/01/2005    REFERRING DIAG: M54.2 (ICD-10-CM) - Cervicalgia   THERAPY DIAG:  Abnormal posture  Muscle weakness (generalized)  Cervicalgia  Left shoulder pain, unspecified chronicity  Right shoulder pain, unspecified chronicity  Rationale for Evaluation and Treatment Rehabilitation  PERTINENT HISTORY: OA, chronic kidney disease, HTN, previous lumbar laminectomy, previous L TKA, R knee scope, B CT releases  PRECAUTIONS: None (Overall arthritis)  SUBJECTIVE: Pt stating after doing his HEP he was sore.   PAIN:  Are you having pain? Yes, 3-4/10 in bilateral shoulders and neck, sharp at times, achy   OBJECTIVE: (objective measures completed at initial evaluation unless otherwise dated)  DIAGNOSTIC FINDINGS:  IMPRESSION: 1. Multilevel cervical spondylosis with resultant diffuse spinal stenosis at C3-4 through C5-6, most pronounced at C5-6 where stenosis is moderate in nature. 2. Multifactorial degenerative changes with resultant multilevel foraminal narrowing as above. Notable findings include severe right with moderate  left C4 foraminal stenosis, severe left C5 foraminal narrowing, with moderate bilateral C6 foraminal stenosis. 3. Multilevel facet arthrosis, most pronounced on the left at C4-5 and bilaterally at C7-T1 where there is associated reactive marrow edema. Findings could contribute to neck pain.     PATIENT SURVEYS:  06/10/22: FOTO 37 (Goal 57) in 12 visits     COGNITION: Overall cognitive status: Within functional limits for tasks assessed     SENSATION: [name] notes R > L pain to the elbow, may be impingement at the shoulder   POSTURE: rounded shoulders, forward head, and decreased lumbar lordosis     CERVICAL ROM:    Active ROM A/PROM (deg) eval  Flexion    Extension 30  Right lateral flexion    Left lateral flexion    Right rotation 25  Left rotation 40   (Blank rows = not tested)   UPPER EXTREMITY ROM:   Passive ROM Right eval Left eval  Shoulder flexion 145 135  Shoulder extension      Shoulder abduction      Shoulder horizontal adduction 40 30  Shoulder extension      Shoulder internal rotation 30 20  Shoulder external rotation 75 70  Elbow flexion      Elbow extension      Wrist flexion      Wrist extension      Wrist ulnar deviation      Wrist radial deviation      Wrist pronation      Wrist supination       (Blank rows = not tested)   Strength (in pounds assessed with hand held dynamometer):   MMT Right eval Left eval  Shoulder flexion      Shoulder extension      Shoulder abduction      Cervical lateral bending 8.4 13.2  Cervical extension 18.7    Shoulder internal rotation 18.9 18.8  Shoulder external rotation 17.3 5.4  Middle trapezius      Lower trapezius      Elbow flexion      Elbow extension      Wrist flexion      Wrist extension      Wrist ulnar deviation      Wrist radial deviation      Wrist pronation      Wrist supination      Grip strength       (Blank rows = not tested)   TODAY'S TREATMENT:  06/13/22:  TherEx:   Pulleys: x 2 minutes flexion and scaption Seated ER table slides using Left UE Seated Scaption table slides c Left UE Standing: Rows Level 2 band x20 Standing ER and IR Rt UE c Rt UE x 20  Standing IR c level 2 band x 20 Left UE Supine AAROM shoulder flexion 1# bar x 15 Supine cervical retraction x 10 holding 5 seconds Supine cervical rotation x 5 c towel roll  Manual:  Grade 2-3 cervical  mobs,  and occipital release   06/10/2022 Shoulder blade pinches 10X 5 seconds Scapular protraction 20X 3 seconds Theraband ER shoulder Yellow 10X Bil Cervical Extension Isometrics into a pillow seated 10X 5 seconds     PATIENT EDUCATION:  Education details: Reviewed imaging and HEP Person educated: Patient Education method: Explanation, Demonstration, Tactile cues, Verbal cues, and Handouts Education comprehension: verbalized understanding, returned demonstration, verbal cues required, tactile cues required, and needs further education     HOME EXERCISE PROGRAM: Access Code: [email or web address] URL: [email or web address] Date: 06/10/2022 Prepared by: Vista Mink   Exercises - Supine Scapular Protraction in Flexion with Dumbbells  - 2 x daily - 7 x weekly - 1 sets - 20 reps - 3 seconds hold - Standing Scapular Retraction  - 5 x daily - 7 x weekly - 1 sets - 5 reps - 5 second hold - Standing Isometric Cervical Extension with Manual Resistance  - 5 x daily - 7 x weekly - 1 sets - 5 reps - 5 hold - Shoulder External Rotation with Anchored Resistance  - 1 x daily - 7 x weekly - 2 sets - 10 reps - 3 hold   ASSESSMENT:   CLINICAL IMPRESSION: Pt arriving today report 3-4/10 pain in bilateral shoulders and neck. Pt tolerating exercises well focusing on shoulder ROM, strengthening and cervical ROM. Pt with good tolerance to manual therapy and reporting less stiffness at end of session.  Continue skilled PT to maximize pt's function.      OBJECTIVE IMPAIRMENTS decreased activity tolerance,  decreased endurance, decreased knowledge of condition, decreased ROM, decreased strength, decreased safety awareness, impaired perceived functional ability, impaired flexibility, impaired UE functional use, improper body mechanics, postural dysfunction, and pain.    ACTIVITY LIMITATIONS carrying, lifting, bending, sitting, standing, squatting, sleeping, bed mobility, dressing, and reach over head   PARTICIPATION LIMITATIONS: meal prep, cleaning, driving, community activity, and yard work   PERSONAL FACTORS OA, chronic kidney disease, HTN, previous lumbar laminectomy, previous L TKA, R knee scope, and B CT releases are also affecting patient's functional outcome.    REHAB POTENTIAL: Good   CLINICAL DECISION MAKING: Stable/uncomplicated   EVALUATION COMPLEXITY: Moderate     GOALS: Goals reviewed with patient? Yes   SHORT TERM GOALS: Target date: 07/08/2022    Improve cervical AROM for extension to 50 degrees and rotation to 50/50 degrees Baseline: 30 and 25/40 degrees Goal status: INITIAL   2.  Improve cervical extension strength to at least 28 pounds Baseline: 18.7 pounds Goal status: INITIAL   3.  Improve shoulder AROM for ER & IR to 80/80 and 50/50 degrees Bil Baseline: 70/75 and 20/30 degrees Goal status: INITIAL   4.  Improve shoulder flexion AROM to 150 degrees Bil Baseline: 135/145 Goal status: INITIAL     LONG TERM GOALS: Target date: 08/05/2022   Improve FOTO to 57 Baseline: 37 Goal status: INITIAL   2.  Improve cervical and scapular pain to consistently 0-3/10 on the Numeric Pain Rating Scale Baseline: 3-6/10 Goal status: INITIAL   3.  Improve cervical extensors and lateral bending strength to 35 and 18/18 pounds Baseline: 18.7 and 13.2/8.4 pounds Goal status: INITIAL   4.  Improve shoulder ER strength to 20 pounds and IR strength to 25 pounds Bil Baseline: 5.4/17.3 pounds and 18.8/18.9 pounds Goal status: INITIAL   5.  Thinh will be independent with his  long-term HEP at DC Baseline: Started 06/10/2022 Goal status: INITIAL     PLAN: PT FREQUENCY: 2x/week   PT DURATION: 8  weeks   PLANNED INTERVENTIONS: Therapeutic exercises, Therapeutic activity, Patient/Family education, Self Care, Joint mobilization, Cryotherapy, Traction, and Manual therapy   PLAN FOR NEXT SESSION: postural correction and strengthening (probable L RTC tear), scapular , RTC and cervical strengthening.  Cervical rotation AROM and shoulder capsular stretching.      Oretha Caprice, PT, MPT 06/13/2022, 4:23 PM

## 2022-06-16 ENCOUNTER — Ambulatory Visit (INDEPENDENT_AMBULATORY_CARE_PROVIDER_SITE_OTHER): Payer: Medicare Other | Admitting: Rehabilitative and Restorative Service Providers"

## 2022-06-16 ENCOUNTER — Encounter: Payer: Self-pay | Admitting: Rehabilitative and Restorative Service Providers"

## 2022-06-16 DIAGNOSIS — M542 Cervicalgia: Secondary | ICD-10-CM | POA: Diagnosis not present

## 2022-06-16 DIAGNOSIS — R293 Abnormal posture: Secondary | ICD-10-CM | POA: Diagnosis not present

## 2022-06-16 DIAGNOSIS — M25512 Pain in left shoulder: Secondary | ICD-10-CM

## 2022-06-16 DIAGNOSIS — M6281 Muscle weakness (generalized): Secondary | ICD-10-CM | POA: Diagnosis not present

## 2022-06-16 DIAGNOSIS — M25511 Pain in right shoulder: Secondary | ICD-10-CM

## 2022-06-16 NOTE — Therapy (Signed)
OUTPATIENT PHYSICAL THERAPY TREATMENT NOTE   Patient Name: Frank Johnston MRN: [id number] DOB:1941/08/25, 81 y.o., male Today's Date: 06/16/2022  PCP: Frank Lopes, MD REFERRING PROVIDER: Jean Rosenthal, MD  END OF SESSION:   PT End of Session - 06/16/22 1351     Visit Number 3    Number of Visits 16    PT Start Time 0923    PT Stop Time 1231    PT Time Calculation (min) 44 min    Activity Tolerance Patient tolerated treatment well;No increased pain    Behavior During Therapy [facility] for tasks assessed/performed              Past Medical History:  Diagnosis Date   Arthritis    Chest pain 10/02/2015   Chronic kidney disease    hx kidney stones   GERD (gastroesophageal reflux disease)    H/O hiatal hernia    Hypertension    Wears glasses    Past Surgical History:  Procedure Laterality Date   25 GAUGE PARS PLANA VITRECTOMY WITH 20 GAUGE MVR PORT Right 09/06/2016   Procedure: 25 GAUGE PARS PLANA VITRECTOMY WITH 20 GAUGE MVR PORT;  Surgeon: Frank Pedro, MD;  Location: [place];  Service: Ophthalmology;  Laterality: Right;   [place]   lumbar lam   CARPAL TUNNEL RELEASE  12/06/2011   Procedure: CARPAL TUNNEL RELEASE;  Surgeon: Frank Sours, MD;  Location: [place];  Service: Orthopedics;  Laterality: Right;   CARPAL TUNNEL RELEASE  08/07/2012   Procedure: CARPAL TUNNEL RELEASE;  Surgeon: Frank Sours, MD;  Location: [place];  Service: Orthopedics;  Laterality: Left;  carpal tunnel release   CHOLECYSTECTOMY  1976   COLONOSCOPY     JOINT REPLACEMENT  4/12   lt total knee   KNEE ARTHROSCOPY     rt and lt   KNEE CLOSED REDUCTION  6/12   lt -after lt total knee   TRIGGER FINGER RELEASE  12/06/2011   Procedure: RELEASE TRIGGER FINGER/A-1 PULLEY;  Surgeon: Frank Sours, MD;  Location: [place];  Service: Orthopedics;  Laterality: Right;  right thumb, middle, and ring fingers    TRIGGER FINGER RELEASE  08/07/2012   Procedure: RELEASE TRIGGER FINGER/A-1 PULLEY;  Surgeon: Frank Sours, MD;  Location: [place];  Service: Orthopedics;  Laterality: Left;  trigger release left ring finger   Patient Active Problem List   Diagnosis Date Noted   Surgery follow-up examination 01/18/2021   Pain due to onychomycosis of toenails of both feet 04/04/2019   History of adenomatous polyp of colon 01/08/2018   Preretinal fibrosis, right eye 09/06/2016   Dyspnea 02/04/2016   Chest pain 10/02/2015   DVT (deep venous thrombosis) ([place]) 03/02/2014   Pulmonary embolism on left ([place]) 02/28/2014   Knee pain, right 02/28/2014   PE (pulmonary embolism) 02/28/2014   Essential hypertension 01/19/2008   DYSPEPSIA 01/19/2008   ILEUS 01/19/2008   RENAL CALCULUS 01/19/2008   ARTHRITIS 01/19/2008   COLONIC POLYPS 08/01/2005   INTERNAL HEMORRHOIDS 08/01/2005    REFERRING DIAG: M54.2 (ICD-10-CM) - Cervicalgia   THERAPY DIAG:  Abnormal posture  Muscle weakness (generalized)  Cervicalgia  Left shoulder pain, unspecified chronicity  Right shoulder pain, unspecified chronicity  Rationale for Evaluation and Treatment Rehabilitation  PERTINENT HISTORY: OA, chronic kidney disease, HTN, previous lumbar laminectomy, previous L TKA, R knee scope, B CT releases  PRECAUTIONS: None (Overall  arthritis)  SUBJECTIVE: Frank Johnston reports good early HEP compliance.  Shoulders are not as stiff as a week ago.  PAIN:  Are you having pain? Yes, 2-5/10 this week, shoulders, neck and R scapula.   OBJECTIVE: (objective measures completed at initial evaluation unless otherwise dated)  DIAGNOSTIC FINDINGS:  IMPRESSION: 1. Multilevel cervical spondylosis with resultant diffuse spinal stenosis at C3-4 through C5-6, most pronounced at C5-6 where stenosis is moderate in nature. 2. Multifactorial degenerative changes with resultant multilevel foraminal narrowing as above. Notable findings  include severe right with moderate left C4 foraminal stenosis, severe left C5 foraminal narrowing, with moderate bilateral C6 foraminal stenosis. 3. Multilevel facet arthrosis, most pronounced on the left at C4-5 and bilaterally at C7-T1 where there is associated reactive marrow edema. Findings could contribute to neck pain.     PATIENT SURVEYS:  06/10/22: FOTO 37 (Goal 57) in 12 visits     COGNITION: Overall cognitive status: Within functional limits for tasks assessed     SENSATION: Frank Johnston notes R > L pain to the elbow, may be impingement at the shoulder   POSTURE: rounded shoulders, forward head, and decreased lumbar lordosis     CERVICAL ROM:    Active ROM A/PROM (deg) eval  Flexion    Extension 30  Right lateral flexion    Left lateral flexion    Right rotation 25  Left rotation 40   (Blank rows = not tested)   UPPER EXTREMITY ROM:   Passive ROM Right eval Left eval L/R in degrees  Shoulder flexion 145 135 155/150  Shoulder extension       Shoulder abduction       Shoulder horizontal adduction 40 30   Shoulder extension       Shoulder internal rotation 30 20   Shoulder external rotation 75 70   Elbow flexion       Elbow extension       Wrist flexion       Wrist extension       Wrist ulnar deviation       Wrist radial deviation       Wrist pronation       Wrist supination        (Blank rows = not tested)   Strength (in pounds assessed with hand held dynamometer):   MMT Right eval Left eval  Shoulder flexion      Shoulder extension      Shoulder abduction      Cervical lateral bending 8.4 13.2  Cervical extension 18.7    Shoulder internal rotation 18.9 18.8  Shoulder external rotation 17.3 5.4  Middle trapezius      Lower trapezius      Elbow flexion      Elbow extension      Wrist flexion      Wrist extension      Wrist ulnar deviation      Wrist radial deviation      Wrist pronation      Wrist supination      Grip strength        (Blank rows = not tested)   TODAY'S TREATMENT:  06/16/2022 Shoulder blade pinches 10X 5 seconds Scapular protraction 20X 3 seconds with 5# weights (reach only) Theraband ER shoulder Yellow Lt and Green Rt 10X Bil 2 sets Cervical Extension Isometrics into a pillow seated 10X 5 seconds  Pull to chest Blue 20X 3 seconds Cervical AROM Rotation 10X 5 seconds Supine shoulder IE/ER stretch (70 degrees abduction, both  elbows on pillows) 10X 10 seconds each direction Bil Supine shoulder flexion 10X 10 seconds Bil (palms in, protract 1st)   06/13/22:  TherEx:  Pulleys: x 2 minutes flexion and scaption Seated ER table slides using Left UE Seated Scaption table slides c Left UE Standing: Rows Level 2 band x20 Standing ER and IR Rt UE c Rt UE x 20  Standing IR c level 2 band x 20 Left UE Supine AAROM shoulder flexion 1# bar x 15 Supine cervical retraction x 10 holding 5 seconds Supine cervical rotation x 5 c towel roll  Manual:  Grade 2-3 cervical  mobs, and occipital release   06/10/2022 Shoulder blade pinches 10X 5 seconds Scapular protraction 20X 3 seconds Theraband ER shoulder Yellow 10X Bil Cervical Extension Isometrics into a pillow seated 10X 5 seconds     PATIENT EDUCATION:  Education details: Reviewed imaging and HEP Person educated: Patient Education method: Explanation, Demonstration, Tactile cues, Verbal cues, and Handouts Education comprehension: verbalized understanding, returned demonstration, verbal cues required, tactile cues required, and needs further education     HOME EXERCISE PROGRAM: Access Code: [email or web address] URL: [email or web address] Date: 06/16/2022 Prepared by: Vista Mink  Exercises - Supine Scapular Protraction in Flexion with Dumbbells  - 2 x daily - 7 x weekly - 1 sets - 20 reps - 3 seconds hold - Standing Scapular Retraction  - 5 x daily - 7 x weekly - 1 sets - 5 reps - 5 second hold - Standing Isometric Cervical Extension with Manual  Resistance  - 5 x daily - 7 x weekly - 1 sets - 5 reps - 5 hold - Shoulder External Rotation with Anchored Resistance  - 1 x daily - 7 x weekly - 2 sets - 10 reps - 3 hold - Seated Cervical Rotation AROM  - 3-5 x daily - 7 x weekly - 1 sets - 10 reps - 5 seconds hold - Standing Shoulder Row with Anchored Resistance  - 1 x daily - 7 x weekly - 1 sets - 20 reps - 3 seconds hold   ASSESSMENT:   CLINICAL IMPRESSION: Khalee is doing a great job with his early HEP compliance.  We did review the importance of staying in his comfortable range as he has been pretty aggressive with his cervical and shoulder stretching.  He was more comfortable by going just up to the point of pain and then backing off a bit.  Shoulder flexion AROM is objectively improved from evaluation and continued cervical, shoulder and scapular strength work along with cervical and shoulder AROM work should help [employer] established at evaluation.     OBJECTIVE IMPAIRMENTS decreased activity tolerance, decreased endurance, decreased knowledge of condition, decreased ROM, decreased strength, decreased safety awareness, impaired perceived functional ability, impaired flexibility, impaired UE functional use, improper body mechanics, postural dysfunction, and pain.    ACTIVITY LIMITATIONS carrying, lifting, bending, sitting, standing, squatting, sleeping, bed mobility, dressing, and reach over head   PARTICIPATION LIMITATIONS: meal prep, cleaning, driving, community activity, and yard work   PERSONAL FACTORS OA, chronic kidney disease, HTN, previous lumbar laminectomy, previous L TKA, R knee scope, and B CT releases are also affecting patient's functional outcome.    REHAB POTENTIAL: Good   CLINICAL DECISION MAKING: Stable/uncomplicated   EVALUATION COMPLEXITY: Moderate     GOALS: Goals reviewed with patient? Yes   SHORT TERM GOALS: Target date: 07/08/2022    Improve cervical AROM for extension to 50 degrees and  rotation to 50/50 degrees Baseline:  30 and 25/40 degrees Goal status: On Going 06/16/2022   2.  Improve cervical extension strength to at least 28 pounds Baseline: 18.7 pounds Goal status: On Going 06/16/2022   3.  Improve shoulder AROM for ER & IR to 80/80 and 50/50 degrees Bil Baseline: 70/75 and 20/30 degrees Goal status: On Going 06/16/2022   4.  Improve shoulder flexion AROM to 150 degrees Bil Baseline: 135/145 Goal status: Met 06/16/2022     LONG TERM GOALS: Target date: 08/05/2022   Improve FOTO to 57 Baseline: 37 Goal status: INITIAL   2.  Improve cervical and scapular pain to consistently 0-3/10 on the Numeric Pain Rating Scale Baseline: 3-6/10 Goal status: INITIAL   3.  Improve cervical extensors and lateral bending strength to 35 and 18/18 pounds Baseline: 18.7 and 13.2/8.4 pounds Goal status: INITIAL   4.  Improve shoulder ER strength to 20 pounds and IR strength to 25 pounds Bil Baseline: 5.4/17.3 pounds and 18.8/18.9 pounds Goal status: INITIAL   5.  Javarie will be independent with his long-term HEP at [place] Baseline: Started 06/10/2022 Goal status: INITIAL     PLAN: PT FREQUENCY: 2x/week   PT DURATION: 8 weeks   PLANNED INTERVENTIONS: Therapeutic exercises, Therapeutic activity, Patient/Family education, Self Care, Joint mobilization, Cryotherapy, Traction, and Manual therapy   PLAN FOR NEXT SESSION: Postural correction and strengthening (probable L RTC tear), scapular, RTC and cervical strengthening.  Continue shoulder capsular stretching.      Farley Ly, PT, MPT 06/16/2022, 2:05 PM

## 2022-06-20 ENCOUNTER — Ambulatory Visit: Payer: Medicare Other | Admitting: Physical Therapy

## 2022-06-20 ENCOUNTER — Encounter: Payer: Self-pay | Admitting: Physical Therapy

## 2022-06-20 DIAGNOSIS — M542 Cervicalgia: Secondary | ICD-10-CM | POA: Diagnosis not present

## 2022-06-20 DIAGNOSIS — M25512 Pain in left shoulder: Secondary | ICD-10-CM | POA: Diagnosis not present

## 2022-06-20 DIAGNOSIS — M6281 Muscle weakness (generalized): Secondary | ICD-10-CM

## 2022-06-20 DIAGNOSIS — R293 Abnormal posture: Secondary | ICD-10-CM | POA: Diagnosis not present

## 2022-06-20 DIAGNOSIS — M25511 Pain in right shoulder: Secondary | ICD-10-CM

## 2022-06-20 NOTE — Therapy (Signed)
OUTPATIENT PHYSICAL THERAPY TREATMENT NOTE   Patient Name: Frank Johnston MRN: [id number] DOB:[birth year], 81 y.o., male Today's Date: 06/20/2022  PCP: [name], MD REFERRING PROVIDER: Jean Rosenthal, MD  END OF SESSION:   PT End of Session - 06/20/22 1430     Visit Number 4    Number of Visits 16    Date for PT Re-Evaluation 08/05/22   8 weeks from eval   PT Start Time 3614    PT Stop Time 1432    PT Time Calculation (min) 40 min    Activity Tolerance Patient tolerated treatment well;No increased pain    Behavior During Therapy [facility] for tasks assessed/performed               Past Medical History:  Diagnosis Date   Arthritis    Chest pain 10/02/2015   Chronic kidney disease    hx kidney stones   GERD (gastroesophageal reflux disease)    H/O hiatal hernia    Hypertension    Wears glasses    Past Surgical History:  Procedure Laterality Date   25 GAUGE PARS PLANA VITRECTOMY WITH 20 GAUGE MVR PORT Right 09/06/2016   Procedure: 25 GAUGE PARS PLANA VITRECTOMY WITH 20 GAUGE MVR PORT;  Surgeon: [name], MD;  Location: [place];  Service: Ophthalmology;  Laterality: Right;   [place]   lumbar lam   CARPAL TUNNEL RELEASE  12/06/2011   Procedure: CARPAL TUNNEL RELEASE;  Surgeon: Wynonia Sours, MD;  Location: [place];  Service: Orthopedics;  Laterality: Right;   CARPAL TUNNEL RELEASE  08/07/2012   Procedure: CARPAL TUNNEL RELEASE;  Surgeon: Wynonia Sours, MD;  Location: [place];  Service: Orthopedics;  Laterality: Left;  carpal tunnel release   CHOLECYSTECTOMY  1976   COLONOSCOPY     JOINT REPLACEMENT  4/12   lt total knee   KNEE ARTHROSCOPY     rt and lt   KNEE CLOSED REDUCTION  6/12   lt -after lt total knee   TRIGGER FINGER RELEASE  12/06/2011   Procedure: RELEASE TRIGGER FINGER/A-1 PULLEY;  Surgeon: Wynonia Sours, MD;  Location: [place];  Service: Orthopedics;   Laterality: Right;  right thumb, middle, and ring fingers   TRIGGER FINGER RELEASE  08/07/2012   Procedure: RELEASE TRIGGER FINGER/A-1 PULLEY;  Surgeon: Wynonia Sours, MD;  Location: [place];  Service: Orthopedics;  Laterality: Left;  trigger release left ring finger   Patient Active Problem List   Diagnosis Date Noted   Surgery follow-up examination 01/18/2021   Pain due to onychomycosis of toenails of both feet 04/04/2019   History of adenomatous polyp of colon 01/08/2018   Preretinal fibrosis, right eye 09/06/2016   Dyspnea 02/04/2016   Chest pain 10/02/2015   DVT (deep venous thrombosis) ([place]) 03/02/2014   Pulmonary embolism on left ([place]) 02/28/2014   Knee pain, right 02/28/2014   PE (pulmonary embolism) 02/28/2014   Essential hypertension 01/19/2008   DYSPEPSIA 01/19/2008   ILEUS 01/19/2008   RENAL CALCULUS 01/19/2008   ARTHRITIS 01/19/2008   COLONIC POLYPS 08/01/2005   INTERNAL HEMORRHOIDS 08/01/2005    REFERRING DIAG: M54.2 (ICD-10-CM) - Cervicalgia   THERAPY DIAG:  Abnormal posture  Muscle weakness (generalized)  Cervicalgia  Left shoulder pain, unspecified chronicity  Right shoulder pain, unspecified chronicity  Rationale for Evaluation and Treatment Rehabilitation  PERTINENT HISTORY: OA, chronic kidney disease, HTN, previous lumbar  laminectomy, previous L TKA, R knee scope, B CT releases  PRECAUTIONS: None (Overall arthritis)  SUBJECTIVE: Pt stating good compliance with his HEP. No pain upon arrival  PAIN:  Are you having pain? None at present, pt stating he had mild pain in his shoulder pain the other day.     OBJECTIVE: (objective measures completed at initial evaluation unless otherwise dated)  DIAGNOSTIC FINDINGS:  IMPRESSION: 1. Multilevel cervical spondylosis with resultant diffuse spinal stenosis at C3-4 through C5-6, most pronounced at C5-6 where stenosis is moderate in nature. 2. Multifactorial degenerative changes with  resultant multilevel foraminal narrowing as above. Notable findings include severe right with moderate left C4 foraminal stenosis, severe left C5 foraminal narrowing, with moderate bilateral C6 foraminal stenosis. 3. Multilevel facet arthrosis, most pronounced on the left at C4-5 and bilaterally at C7-T1 where there is associated reactive marrow edema. Findings could contribute to neck pain.     PATIENT SURVEYS:  06/10/22: FOTO 37 (Goal 57) in 12 visits     COGNITION: Overall cognitive status: Within functional limits for tasks assessed     SENSATION: Frank Johnston notes R > L pain to the elbow, may be impingement at the shoulder   POSTURE: rounded shoulders, forward head, and decreased lumbar lordosis     CERVICAL ROM:    Active ROM A/PROM (deg) eval AROM 06/20/22   Flexion   50  Extension 30 32  Right lateral flexion     Left lateral flexion     Right rotation 25 45  Left rotation 40 52   (Blank rows = not tested)   UPPER EXTREMITY ROM:   Passive ROM Right eval Left eval L/R in degrees  Shoulder flexion 145 135 155/150  Shoulder extension       Shoulder abduction       Shoulder horizontal adduction 40 30   Shoulder extension       Shoulder internal rotation 30 20   Shoulder external rotation 75 70   Elbow flexion       Elbow extension       Wrist flexion       Wrist extension       Wrist ulnar deviation       Wrist radial deviation       Wrist pronation       Wrist supination        (Blank rows = not tested)   Strength (in pounds assessed with hand held dynamometer):   MMT Right eval Left eval  Shoulder flexion      Shoulder extension      Shoulder abduction      Cervical lateral bending 8.4 13.2  Cervical extension 18.7    Shoulder internal rotation 18.9 18.8  Shoulder external rotation 17.3 5.4  Middle trapezius      Lower trapezius      Elbow flexion      Elbow extension      Wrist flexion      Wrist extension      Wrist ulnar deviation       Wrist radial deviation      Wrist pronation      Wrist supination      Grip strength       (Blank rows = not tested)   TODAY'S TREATMENT:  06/20/22:  TherEx:  Pulleys: x 2 minutes flexion and scaption Supine AAROM: shoulder flexion c 2# bar x 20 Supine isometrics pushing into yellow physioball x 10 (extension, flexion, IR and ER, with  assistance by therapist) Standing Rows: 2 x 15 Level 4 band holding 3 sec Standing: ball circles on wall with shoulder at 90 deg x 20 each direction with bilateral UE"s Upper Trap stretch: x 5 holding sec bil Standing with yellow physioball on thoracic spine, attempting to reach back with bil elbows Manual:  Grade 2-3 cervical  mobs, and occipital release   06/16/2022 Shoulder blade pinches 10X 5 seconds Scapular protraction 20X 3 seconds with 5# weights (reach only) Theraband ER shoulder Yellow Lt and Green Rt 10X Bil 2 sets Cervical Extension Isometrics into a pillow seated 10X 5 seconds  Pull to chest Blue 20X 3 seconds Cervical AROM Rotation 10X 5 seconds Supine shoulder IE/ER stretch (70 degrees abduction, both elbows on pillows) 10X 10 seconds each direction Bil Supine shoulder flexion 10X 10 seconds Bil (palms in, protract 1st)   06/13/22:  TherEx:  Pulleys: x 2 minutes flexion and scaption Seated ER table slides using Left UE Seated Scaption table slides c Left UE Standing: Rows Level 2 band x20 Standing ER and IR Rt UE c Rt UE x 20  Standing IR c level 2 band x 20 Left UE Supine AAROM shoulder flexion 1# bar x 15 Supine cervical retraction x 10 holding 5 seconds Supine cervical rotation x 5 c towel roll  Manual:  Grade 2-3 cervical  mobs, and occipital release       PATIENT EDUCATION:  Education details: Reviewed imaging and HEP Person educated: Patient Education method: Explanation, Demonstration, Tactile cues, Verbal cues, and Handouts Education comprehension: verbalized understanding, returned demonstration, verbal cues  required, tactile cues required, and needs further education     HOME EXERCISE PROGRAM: Access Code: [email or web address] URL: [email or web address] Date: 06/16/2022 Prepared by: Vista Mink  Exercises - Supine Scapular Protraction in Flexion with Dumbbells  - 2 x daily - 7 x weekly - 1 sets - 20 reps - 3 seconds hold - Standing Scapular Retraction  - 5 x daily - 7 x weekly - 1 sets - 5 reps - 5 second hold - Standing Isometric Cervical Extension with Manual Resistance  - 5 x daily - 7 x weekly - 1 sets - 5 reps - 5 hold - Shoulder External Rotation with Anchored Resistance  - 1 x daily - 7 x weekly - 2 sets - 10 reps - 3 hold - Seated Cervical Rotation AROM  - 3-5 x daily - 7 x weekly - 1 sets - 10 reps - 5 seconds hold - Standing Shoulder Row with Anchored Resistance  - 1 x daily - 7 x weekly - 1 sets - 20 reps - 3 seconds hold   ASSESSMENT:   CLINICAL IMPRESSION:      OBJECTIVE IMPAIRMENTS decreased activity tolerance, decreased endurance, decreased knowledge of condition, decreased ROM, decreased strength, decreased safety awareness, impaired perceived functional ability, impaired flexibility, impaired UE functional use, improper body mechanics, postural dysfunction, and pain.    ACTIVITY LIMITATIONS carrying, lifting, bending, sitting, standing, squatting, sleeping, bed mobility, dressing, and reach over head   PARTICIPATION LIMITATIONS: meal prep, cleaning, driving, community activity, and yard work   PERSONAL FACTORS OA, chronic kidney disease, HTN, previous lumbar laminectomy, previous L TKA, R knee scope, and B CT releases are also affecting patient's functional outcome.    REHAB POTENTIAL: Good   CLINICAL DECISION MAKING: Stable/uncomplicated   EVALUATION COMPLEXITY: Moderate     GOALS: Goals reviewed with patient? Yes   SHORT TERM GOALS: Target date: 07/08/2022    Improve cervical AROM  for extension to 50 degrees and rotation to 50/50 degrees Baseline: 30 and  25/40 degrees Goal status: On Going 06/16/2022   2.  Improve cervical extension strength to at least 28 pounds Baseline: 18.7 pounds Goal status: On Going 06/16/2022   3.  Improve shoulder AROM for ER & IR to 80/80 and 50/50 degrees Bil Baseline: 70/75 and 20/30 degrees Goal status: On Going 06/16/2022   4.  Improve shoulder flexion AROM to 150 degrees Bil Baseline: 135/145 Goal status: Met 06/16/2022     LONG TERM GOALS: Target date: 08/05/2022   Improve FOTO to 57 Baseline: 37 Goal status: INITIAL   2.  Improve cervical and scapular pain to consistently 0-3/10 on the Numeric Pain Rating Scale Baseline: 3-6/10 Goal status: INITIAL   3.  Improve cervical extensors and lateral bending strength to 35 and 18/18 pounds Baseline: 18.7 and 13.2/8.4 pounds Goal status: INITIAL   4.  Improve shoulder ER strength to 20 pounds and IR strength to 25 pounds Bil Baseline: 5.4/17.3 pounds and 18.8/18.9 pounds Goal status: INITIAL   5.  Page will be independent with his long-term HEP at [place] Baseline: Started 06/10/2022 Goal status: INITIAL     PLAN: PT FREQUENCY: 2x/week   PT DURATION: 8 weeks   PLANNED INTERVENTIONS: Therapeutic exercises, Therapeutic activity, Patient/Family education, Self Care, Joint mobilization, Cryotherapy, Traction, and Manual therapy   PLAN FOR NEXT SESSION: Postural correction and strengthening (probable L RTC tear), scapular, RTC and cervical strengthening.  Continue shoulder capsular stretching.      Frank Johnston, PT, MPT 06/20/2022, 2:35 PM

## 2022-06-23 ENCOUNTER — Encounter: Payer: Self-pay | Admitting: Physical Therapy

## 2022-06-23 ENCOUNTER — Ambulatory Visit (INDEPENDENT_AMBULATORY_CARE_PROVIDER_SITE_OTHER): Payer: Medicare Other | Admitting: Physical Therapy

## 2022-06-23 DIAGNOSIS — M25511 Pain in right shoulder: Secondary | ICD-10-CM

## 2022-06-23 DIAGNOSIS — M25512 Pain in left shoulder: Secondary | ICD-10-CM | POA: Diagnosis not present

## 2022-06-23 DIAGNOSIS — R293 Abnormal posture: Secondary | ICD-10-CM

## 2022-06-23 DIAGNOSIS — M6281 Muscle weakness (generalized): Secondary | ICD-10-CM

## 2022-06-23 DIAGNOSIS — M542 Cervicalgia: Secondary | ICD-10-CM | POA: Diagnosis not present

## 2022-06-23 NOTE — Therapy (Signed)
OUTPATIENT PHYSICAL THERAPY TREATMENT NOTE   Patient Name: Frank Johnston MRN: [id number] DOB:1941-02-05, 81 y.o., male Today's Date: 06/23/2022  PCP: [name], MD REFERRING PROVIDER: Jean Rosenthal, MD  END OF SESSION:   PT End of Session - 06/23/22 1524     Visit Number 5    Number of Visits 16    Date for PT Re-Evaluation 08/05/22    PT Start Time 1751    PT Stop Time 1556    PT Time Calculation (min) 39 min    Activity Tolerance Patient tolerated treatment well;No increased pain    Behavior During Therapy [facility] for tasks assessed/performed                Past Medical History:  Diagnosis Date   Arthritis    Chest pain 10/02/2015   Chronic kidney disease    hx kidney stones   GERD (gastroesophageal reflux disease)    H/O hiatal hernia    Hypertension    Wears glasses    Past Surgical History:  Procedure Laterality Date   25 GAUGE PARS PLANA VITRECTOMY WITH 20 GAUGE MVR PORT Right 09/06/2016   Procedure: 25 GAUGE PARS PLANA VITRECTOMY WITH 20 GAUGE MVR PORT;  Surgeon: [name], MD;  Location: [place];  Service: Ophthalmology;  Laterality: Right;   [place]   lumbar lam   CARPAL TUNNEL RELEASE  12/06/2011   Procedure: CARPAL TUNNEL RELEASE;  Surgeon: Wynonia Sours, MD;  Location: [place];  Service: Orthopedics;  Laterality: Right;   CARPAL TUNNEL RELEASE  08/07/2012   Procedure: CARPAL TUNNEL RELEASE;  Surgeon: Wynonia Sours, MD;  Location: [place];  Service: Orthopedics;  Laterality: Left;  carpal tunnel release   CHOLECYSTECTOMY  1976   COLONOSCOPY     JOINT REPLACEMENT  4/12   lt total knee   KNEE ARTHROSCOPY     rt and lt   KNEE CLOSED REDUCTION  6/12   lt -after lt total knee   TRIGGER FINGER RELEASE  12/06/2011   Procedure: RELEASE TRIGGER FINGER/A-1 PULLEY;  Surgeon: Wynonia Sours, MD;  Location: [place];  Service: Orthopedics;  Laterality: Right;   right thumb, middle, and ring fingers   TRIGGER FINGER RELEASE  08/07/2012   Procedure: RELEASE TRIGGER FINGER/A-1 PULLEY;  Surgeon: Wynonia Sours, MD;  Location: [place];  Service: Orthopedics;  Laterality: Left;  trigger release left ring finger   Patient Active Problem List   Diagnosis Date Noted   Surgery follow-up examination 01/18/2021   Pain due to onychomycosis of toenails of both feet 04/04/2019   History of adenomatous polyp of colon 01/08/2018   Preretinal fibrosis, right eye 09/06/2016   Dyspnea 02/04/2016   Chest pain 10/02/2015   DVT (deep venous thrombosis) ([place]) 03/02/2014   Pulmonary embolism on left ([place]) 02/28/2014   Knee pain, right 02/28/2014   PE (pulmonary embolism) 02/28/2014   Essential hypertension 01/19/2008   DYSPEPSIA 01/19/2008   ILEUS 01/19/2008   RENAL CALCULUS 01/19/2008   ARTHRITIS 01/19/2008   COLONIC POLYPS 08/01/2005   INTERNAL HEMORRHOIDS 08/01/2005    REFERRING DIAG: M54.2 (ICD-10-CM) - Cervicalgia   THERAPY DIAG:  Abnormal posture  Muscle weakness (generalized)  Cervicalgia  Left shoulder pain, unspecified chronicity  Right shoulder pain, unspecified chronicity  Rationale for Evaluation and Treatment Rehabilitation  PERTINENT HISTORY: OA, chronic kidney disease, HTN, previous lumbar laminectomy, previous L TKA,  R knee scope, B CT releases  PRECAUTIONS: None (Overall arthritis)  SUBJECTIVE: Feeling better, shoulder is still a problem but I'm doing better with the rest overall  PAIN:  Are you having pain? 1/10 in neck (general soreness) Aggravating factors: nothing Relieving factors: medicine, rest    OBJECTIVE: (objective measures completed at initial evaluation unless otherwise dated)  DIAGNOSTIC FINDINGS:  IMPRESSION: 1. Multilevel cervical spondylosis with resultant diffuse spinal stenosis at C3-4 through C5-6, most pronounced at C5-6 where stenosis is moderate in nature. 2. Multifactorial  degenerative changes with resultant multilevel foraminal narrowing as above. Notable findings include severe right with moderate left C4 foraminal stenosis, severe left C5 foraminal narrowing, with moderate bilateral C6 foraminal stenosis. 3. Multilevel facet arthrosis, most pronounced on the left at C4-5 and bilaterally at C7-T1 where there is associated reactive marrow edema. Findings could contribute to neck pain.     PATIENT SURVEYS:  06/10/22: FOTO 37 (Goal 57) in 12 visits     COGNITION: Overall cognitive status: Within functional limits for tasks assessed     SENSATION: [name] notes R > L pain to the elbow, may be impingement at the shoulder   POSTURE: rounded shoulders, forward head, and decreased lumbar lordosis     CERVICAL ROM:    Active ROM A/PROM (deg) eval AROM 06/20/22   Flexion   50  Extension 30 32  Right lateral flexion     Left lateral flexion     Right rotation 25 45  Left rotation 40 52   (Blank rows = not tested)   UPPER EXTREMITY ROM:   Passive ROM Right eval Left eval L/R in degrees  Shoulder flexion 145 135 155/150  Shoulder extension       Shoulder abduction       Shoulder horizontal adduction 40 30   Shoulder extension       Shoulder internal rotation 30 20   Shoulder external rotation 75 70   Elbow flexion       Elbow extension       Wrist flexion       Wrist extension       Wrist ulnar deviation       Wrist radial deviation       Wrist pronation       Wrist supination        (Blank rows = not tested)   Strength (in pounds assessed with hand held dynamometer):   MMT Right eval Left eval  Shoulder flexion      Shoulder extension      Shoulder abduction      Cervical lateral bending 8.4 13.2  Cervical extension 18.7    Shoulder internal rotation 18.9 18.8  Shoulder external rotation 17.3 5.4  Middle trapezius      Lower trapezius      Elbow flexion      Elbow extension      Wrist flexion      Wrist extension       Wrist ulnar deviation      Wrist radial deviation      Wrist pronation      Wrist supination      Grip strength       (Blank rows = not tested)   TODAY'S TREATMENT:   06/23/22  TherEx:  UBE L3 x3 min forward/3 min backward  Chin tucks x10 Chin tucks with rotation x10 B  Chin tucks with lateral flexion x10 B Thoracic excursions x10 each direction Scapular retractions 10# 2x12  Shoulder extensions 15# 2x12 UE ranger AAROM: flexion x15, scaption x15 to tolerated ROM UE ranger circles x10 CW/10 CCW L UE    06/20/22:  TherEx:  Pulleys: x 2 minutes flexion and scaption Supine AAROM: shoulder flexion c 2# bar x 20 Supine isometrics pushing into yellow physioball x 10 (extension, flexion, IR and ER, with assistance by therapist) Standing Rows: 2 x 15 Level 4 band holding 3 sec Standing: ball circles on wall with shoulder at 90 deg x 20 each direction with bilateral UE"s Upper Trap stretch: x 5 holding sec bil Standing with yellow physioball on thoracic spine, attempting to reach back with bil elbows Manual:  Grade 2-3 cervical  mobs, and occipital release   06/16/2022 Shoulder blade pinches 10X 5 seconds Scapular protraction 20X 3 seconds with 5# weights (reach only) Theraband ER shoulder Yellow Lt and Green Rt 10X Bil 2 sets Cervical Extension Isometrics into a pillow seated 10X 5 seconds  Pull to chest Blue 20X 3 seconds Cervical AROM Rotation 10X 5 seconds Supine shoulder IE/ER stretch (70 degrees abduction, both elbows on pillows) 10X 10 seconds each direction Bil Supine shoulder flexion 10X 10 seconds Bil (palms in, protract 1st)   06/13/22:  TherEx:  Pulleys: x 2 minutes flexion and scaption Seated ER table slides using Left UE Seated Scaption table slides c Left UE Standing: Rows Level 2 band x20 Standing ER and IR Rt UE c Rt UE x 20  Standing IR c level 2 band x 20 Left UE Supine AAROM shoulder flexion 1# bar x 15 Supine cervical retraction x 10 holding 5  seconds Supine cervical rotation x 5 c towel roll  Manual:  Grade 2-3 cervical  mobs, and occipital release       PATIENT EDUCATION:  Education details: Reviewed imaging and HEP Person educated: Patient Education method: Explanation, Demonstration, Tactile cues, Verbal cues, and Handouts Education comprehension: verbalized understanding, returned demonstration, verbal cues required, tactile cues required, and needs further education     HOME EXERCISE PROGRAM: Access Code: [email or web address] URL: [email or web address] Date: 06/16/2022 Prepared by: Vista Mink  Exercises - Supine Scapular Protraction in Flexion with Dumbbells  - 2 x daily - 7 x weekly - 1 sets - 20 reps - 3 seconds hold - Standing Scapular Retraction  - 5 x daily - 7 x weekly - 1 sets - 5 reps - 5 second hold - Standing Isometric Cervical Extension with Manual Resistance  - 5 x daily - 7 x weekly - 1 sets - 5 reps - 5 hold - Shoulder External Rotation with Anchored Resistance  - 1 x daily - 7 x weekly - 2 sets - 10 reps - 3 hold - Seated Cervical Rotation AROM  - 3-5 x daily - 7 x weekly - 1 sets - 10 reps - 5 seconds hold - Standing Shoulder Row with Anchored Resistance  - 1 x daily - 7 x weekly - 1 sets - 20 reps - 3 seconds hold   ASSESSMENT:   CLINICAL IMPRESSION:   Frank Johnston arrives today doing well, seems like pain is improving overall. Warmed up on the UBE then worked on postural training and mobility, also spent some time working on shoulder ROM as tolerated as well. Unable to perform active ER against resistance with L shoulder- not painful, just weak. Will continue to progress as able.    OBJECTIVE IMPAIRMENTS decreased activity tolerance, decreased endurance, decreased knowledge of condition, decreased ROM, decreased strength, decreased safety awareness, impaired perceived functional ability,  impaired flexibility, impaired UE functional use, improper body mechanics, postural dysfunction, and pain.     ACTIVITY LIMITATIONS carrying, lifting, bending, sitting, standing, squatting, sleeping, bed mobility, dressing, and reach over head   PARTICIPATION LIMITATIONS: meal prep, cleaning, driving, community activity, and yard work   PERSONAL FACTORS OA, chronic kidney disease, HTN, previous lumbar laminectomy, previous L TKA, R knee scope, and B CT releases are also affecting patient's functional outcome.    REHAB POTENTIAL: Good   CLINICAL DECISION MAKING: Stable/uncomplicated   EVALUATION COMPLEXITY: Moderate     GOALS: Goals reviewed with patient? Yes   SHORT TERM GOALS: Target date: 07/08/2022    Improve cervical AROM for extension to 50 degrees and rotation to 50/50 degrees Baseline: 30 and 25/40 degrees Goal status: On Going 06/16/2022   2.  Improve cervical extension strength to at least 28 pounds Baseline: 18.7 pounds Goal status: On Going 06/16/2022   3.  Improve shoulder AROM for ER & IR to 80/80 and 50/50 degrees Bil Baseline: 70/75 and 20/30 degrees Goal status: On Going 06/16/2022   4.  Improve shoulder flexion AROM to 150 degrees Bil Baseline: 135/145 Goal status: Met 06/16/2022     LONG TERM GOALS: Target date: 08/05/2022   Improve FOTO to 57 Baseline: 37 Goal status: INITIAL   2.  Improve cervical and scapular pain to consistently 0-3/10 on the Numeric Pain Rating Scale Baseline: 3-6/10 Goal status: INITIAL   3.  Improve cervical extensors and lateral bending strength to 35 and 18/18 pounds Baseline: 18.7 and 13.2/8.4 pounds Goal status: INITIAL   4.  Improve shoulder ER strength to 20 pounds and IR strength to 25 pounds Bil Baseline: 5.4/17.3 pounds and 18.8/18.9 pounds Goal status: INITIAL   5.  Frank Johnston will be independent with his long-term HEP at [place] Baseline: Started 06/10/2022 Goal status: INITIAL     PLAN: PT FREQUENCY: 2x/week   PT DURATION: 8 weeks   PLANNED INTERVENTIONS: Therapeutic exercises, Therapeutic activity, Patient/Family  education, Self Care, Joint mobilization, Cryotherapy, Traction, and Manual therapy   PLAN FOR NEXT SESSION: Postural correction and strengthening (probable L RTC tear), scapular, RTC and cervical strengthening.  Continue shoulder capsular stretching.      Ann Lions PT DPT PN2  06/23/2022, 3:57 PM

## 2022-06-28 ENCOUNTER — Ambulatory Visit: Payer: Medicare Other | Admitting: Physical Therapy

## 2022-06-28 ENCOUNTER — Encounter: Payer: Self-pay | Admitting: Physical Therapy

## 2022-06-28 DIAGNOSIS — R293 Abnormal posture: Secondary | ICD-10-CM

## 2022-06-28 DIAGNOSIS — M542 Cervicalgia: Secondary | ICD-10-CM | POA: Diagnosis not present

## 2022-06-28 DIAGNOSIS — M25511 Pain in right shoulder: Secondary | ICD-10-CM

## 2022-06-28 DIAGNOSIS — M6281 Muscle weakness (generalized): Secondary | ICD-10-CM

## 2022-06-28 DIAGNOSIS — M25512 Pain in left shoulder: Secondary | ICD-10-CM | POA: Diagnosis not present

## 2022-06-28 NOTE — Therapy (Signed)
OUTPATIENT PHYSICAL THERAPY TREATMENT NOTE   Patient Name: Frank Johnston MRN: [id number] DOB:[birth year], 81 y.o., male Today's Date: 06/28/2022  PCP: [name], MD REFERRING PROVIDER: Jean Rosenthal, MD  END OF SESSION:   PT End of Session - 06/28/22 1521     Visit Number 6    Number of Visits 16    Date for PT Re-Evaluation 08/05/22    PT Start Time 6237    PT Stop Time 1520    PT Time Calculation (min) 45 min    Activity Tolerance Patient tolerated treatment well;No increased pain    Behavior During Therapy [facility] for tasks assessed/performed                 Past Medical History:  Diagnosis Date   Arthritis    Chest pain 10/02/2015   Chronic kidney disease    hx kidney stones   GERD (gastroesophageal reflux disease)    H/O hiatal hernia    Hypertension    Wears glasses    Past Surgical History:  Procedure Laterality Date   25 GAUGE PARS PLANA VITRECTOMY WITH 20 GAUGE MVR PORT Right 09/06/2016   Procedure: 25 GAUGE PARS PLANA VITRECTOMY WITH 20 GAUGE MVR PORT;  Surgeon: [name], MD;  Location: [place];  Service: Ophthalmology;  Laterality: Right;   [place]   lumbar lam   CARPAL TUNNEL RELEASE  12/06/2011   Procedure: CARPAL TUNNEL RELEASE;  Surgeon: Wynonia Sours, MD;  Location: [place];  Service: Orthopedics;  Laterality: Right;   CARPAL TUNNEL RELEASE  08/07/2012   Procedure: CARPAL TUNNEL RELEASE;  Surgeon: Wynonia Sours, MD;  Location: [place];  Service: Orthopedics;  Laterality: Left;  carpal tunnel release   CHOLECYSTECTOMY  1976   COLONOSCOPY     JOINT REPLACEMENT  4/12   lt total knee   KNEE ARTHROSCOPY     rt and lt   KNEE CLOSED REDUCTION  6/12   lt -after lt total knee   TRIGGER FINGER RELEASE  12/06/2011   Procedure: RELEASE TRIGGER FINGER/A-1 PULLEY;  Surgeon: Wynonia Sours, MD;  Location: [place];  Service: Orthopedics;  Laterality: Right;   right thumb, middle, and ring fingers   TRIGGER FINGER RELEASE  08/07/2012   Procedure: RELEASE TRIGGER FINGER/A-1 PULLEY;  Surgeon: Wynonia Sours, MD;  Location: [place];  Service: Orthopedics;  Laterality: Left;  trigger release left ring finger   Patient Active Problem List   Diagnosis Date Noted   Surgery follow-up examination 01/18/2021   Pain due to onychomycosis of toenails of both feet 04/04/2019   History of adenomatous polyp of colon 01/08/2018   Preretinal fibrosis, right eye 09/06/2016   Dyspnea 02/04/2016   Chest pain 10/02/2015   DVT (deep venous thrombosis) ([place]) 03/02/2014   Pulmonary embolism on left ([place]) 02/28/2014   Knee pain, right 02/28/2014   PE (pulmonary embolism) 02/28/2014   Essential hypertension 01/19/2008   DYSPEPSIA 01/19/2008   ILEUS 01/19/2008   RENAL CALCULUS 01/19/2008   ARTHRITIS 01/19/2008   COLONIC POLYPS 08/01/2005   INTERNAL HEMORRHOIDS 08/01/2005    REFERRING DIAG: M54.2 (ICD-10-CM) - Cervicalgia   THERAPY DIAG:  Abnormal posture  Muscle weakness (generalized)  Cervicalgia  Left shoulder pain, unspecified chronicity  Right shoulder pain, unspecified chronicity  Rationale for Evaluation and Treatment Rehabilitation  PERTINENT HISTORY: OA, chronic kidney disease, HTN, previous lumbar laminectomy, previous L  TKA, R knee scope, B CT releases  PRECAUTIONS: None (Overall arthritis)  SUBJECTIVE: Pt stating no pain upon arrival. Pt stating "I feel pretty good right now".  PAIN:  Are you having pain? 0/10 Aggravating factors: nothing Relieving factors: medicine, rest    OBJECTIVE: (objective measures completed at initial evaluation unless otherwise dated)  DIAGNOSTIC FINDINGS:  IMPRESSION: 1. Multilevel cervical spondylosis with resultant diffuse spinal stenosis at C3-4 through C5-6, most pronounced at C5-6 where stenosis is moderate in nature. 2. Multifactorial degenerative changes with resultant  multilevel foraminal narrowing as above. Notable findings include severe right with moderate left C4 foraminal stenosis, severe left C5 foraminal narrowing, with moderate bilateral C6 foraminal stenosis. 3. Multilevel facet arthrosis, most pronounced on the left at C4-5 and bilaterally at C7-T1 where there is associated reactive marrow edema. Findings could contribute to neck pain.     PATIENT SURVEYS:  06/10/22: FOTO 37 (Goal 57) in 12 visits     COGNITION: Overall cognitive status: Within functional limits for tasks assessed     SENSATION: [name] notes R > L pain to the elbow, may be impingement at the shoulder   POSTURE: rounded shoulders, forward head, and decreased lumbar lordosis     CERVICAL ROM:    Active ROM A/PROM (deg) eval AROM 06/20/22  AROM 06/28/22  Flexion   50   Extension 30 32 40  Right lateral flexion      Left lateral flexion      Right rotation 25 45 45  Left rotation 40 52 52   (Blank rows = not tested)   UPPER EXTREMITY ROM:   Passive ROM Right eval Left eval L/R in degrees Left/Right Passive 06/28/22 sitting  Shoulder flexion 145 135 155/150 150/148  Shoulder extension        Shoulder abduction        Shoulder horizontal adduction 40 30    Shoulder extension        Shoulder internal rotation 30 20    Shoulder external rotation 75 70    Elbow flexion        Elbow extension        Wrist flexion        Wrist extension        Wrist ulnar deviation        Wrist radial deviation        Wrist pronation        Wrist supination         (Blank rows = not tested)   Strength (in pounds assessed with hand held dynamometer):   MMT Right eval Left eval Right 06/28/22 Left  06/28/22 Seated   Shoulder flexion        Shoulder extension        Shoulder abduction        Cervical lateral bending 8.4 13.2    Cervical extension 18.7      Shoulder internal rotation 18.9 18.8 26.2 29.0  Shoulder external rotation 17.3 5.4 20.1 2.0  Middle  trapezius        Lower trapezius        Elbow flexion        Elbow extension        Wrist flexion        Wrist extension        Wrist ulnar deviation        Wrist radial deviation        Wrist pronation        Wrist supination  Grip strength         (Blank rows = not tested)   TODAY'S TREATMENT:  06/28/22:  TherEx:  UBE: level 2  x 2 minutes each direction BATCA: rows 15# 2 x 15 BATCA: lat pull downs: 10# x 10, 15# x 15 BATCA: shoulder extension 5# 2 x 10 bil UE's Upper Trap stretch: x 5 holding sec bil Wall push ups 2 x 10 Walking red ball up the wall x 10 with stretch at end range shoulder flexion and holding 3-5 seconds Manual:  IASTM using Biofreeze to bilateral upper trap, levator scapula, rhomboids and infraspinatus Modalities:  Moist heat x 5 minutes to cervical spine and bil upper traps   06/23/22 TherEx:  UBE L3 x3 min forward/3 min backward  Chin tucks x10 Chin tucks with rotation x10 B  Chin tucks with lateral flexion x10 B Thoracic excursions x10 each direction Scapular retractions 10# 2x12 Shoulder extensions 15# 2x12 UE ranger AAROM: flexion x15, scaption x15 to tolerated ROM UE ranger circles x10 CW/10 CCW L UE    06/20/22:  TherEx:  Pulleys: x 2 minutes flexion and scaption Supine AAROM: shoulder flexion c 2# bar x 20 Supine isometrics pushing into yellow physioball x 10 (extension, flexion, IR and ER, with assistance by therapist) Standing Rows: 2 x 15 Level 4 band holding 3 sec Standing: ball circles on wall with shoulder at 90 deg x 20 each direction with bilateral UE"s Upper Trap stretch: x 5 holding sec bil Standing with yellow physioball on thoracic spine, attempting to reach back with bil elbows Manual:  Grade 2-3 cervical  mobs, and occipital release   06/16/2022 Shoulder blade pinches 10X 5 seconds Scapular protraction 20X 3 seconds with 5# weights (reach only) Theraband ER shoulder Yellow Lt and Green Rt 10X Bil 2 sets Cervical  Extension Isometrics into a pillow seated 10X 5 seconds  Pull to chest Blue 20X 3 seconds Cervical AROM Rotation 10X 5 seconds Supine shoulder IE/ER stretch (70 degrees abduction, both elbows on pillows) 10X 10 seconds each direction Bil Supine shoulder flexion 10X 10 seconds Bil (palms in, protract 1st)          PATIENT EDUCATION:  Education details: Reviewed imaging and HEP Person educated: Patient Education method: Explanation, Demonstration, Tactile cues, Verbal cues, and Handouts Education comprehension: verbalized understanding, returned demonstration, verbal cues required, tactile cues required, and needs further education     HOME EXERCISE PROGRAM: Access Code: [email or web address] URL: [email or web address] Date: 06/16/2022 Prepared by: Vista Mink  Exercises - Supine Scapular Protraction in Flexion with Dumbbells  - 2 x daily - 7 x weekly - 1 sets - 20 reps - 3 seconds hold - Standing Scapular Retraction  - 5 x daily - 7 x weekly - 1 sets - 5 reps - 5 second hold - Standing Isometric Cervical Extension with Manual Resistance  - 5 x daily - 7 x weekly - 1 sets - 5 reps - 5 hold - Shoulder External Rotation with Anchored Resistance  - 1 x daily - 7 x weekly - 2 sets - 10 reps - 3 hold - Seated Cervical Rotation AROM  - 3-5 x daily - 7 x weekly - 1 sets - 10 reps - 5 seconds hold - Standing Shoulder Row with Anchored Resistance  - 1 x daily - 7 x weekly - 1 sets - 20 reps - 3 seconds hold   ASSESSMENT:   CLINICAL IMPRESSION:  Pt arriving today reporting no pain at rest. Pt stating  still tightness and soreness from time to time. MMT performed using Hand Held Dynamometer and pt with significant weakness in Left shoulder ER. Pt has made progress with Rt shoulder strength. Pt reporting less stiffness at end of session. Continue skilled PT to progress toward LTG's.    OBJECTIVE IMPAIRMENTS decreased activity tolerance, decreased endurance, decreased knowledge of condition,  decreased ROM, decreased strength, decreased safety awareness, impaired perceived functional ability, impaired flexibility, impaired UE functional use, improper body mechanics, postural dysfunction, and pain.    ACTIVITY LIMITATIONS carrying, lifting, bending, sitting, standing, squatting, sleeping, bed mobility, dressing, and reach over head   PARTICIPATION LIMITATIONS: meal prep, cleaning, driving, community activity, and yard work   PERSONAL FACTORS OA, chronic kidney disease, HTN, previous lumbar laminectomy, previous L TKA, R knee scope, and B CT releases are also affecting patient's functional outcome.    REHAB POTENTIAL: Good   CLINICAL DECISION MAKING: Stable/uncomplicated   EVALUATION COMPLEXITY: Moderate     GOALS: Goals reviewed with patient? Yes   SHORT TERM GOALS: Target date: 07/08/2022    Improve cervical AROM for extension to 50 degrees and rotation to 50/50 degrees Baseline: 30 and 25/40 degrees Goal status: On Going 06/28/22   2.  Improve cervical extension strength to at least 28 pounds Baseline: 18.7 pounds Goal status: On Going 06/16/2022   3.  Improve shoulder AROM for ER & IR to 80/80 and 50/50 degrees Bil Baseline: 70/75 and 20/30 degrees Goal status: On Going 06/16/2022   4.  Improve shoulder flexion AROM to 150 degrees Bil Baseline: 135/145 Goal status: Met 06/16/2022     LONG TERM GOALS: Target date: 08/05/2022   Improve FOTO to 57 Baseline: 37 Goal status: INITIAL   2.  Improve cervical and scapular pain to consistently 0-3/10 on the Numeric Pain Rating Scale Baseline: 3-6/10 Goal status: on-going 06/28/22   3.  Improve cervical extensors and lateral bending strength to 35 and 18/18 pounds Baseline: 18.7 and 13.2/8.4 pounds Goal status: INITIAL   4.  Improve shoulder ER strength to 20 pounds and IR strength to 25 pounds Bil Baseline: 5.4/17.3 pounds and 18.8/18.9 pounds Goal status: INITIAL   5.  Atlee will be independent with his  long-term HEP at DC Baseline: Started 06/10/2022 Goal status: INITIAL     PLAN: PT FREQUENCY: 2x/week   PT DURATION: 8 weeks   PLANNED INTERVENTIONS: Therapeutic exercises, Therapeutic activity, Patient/Family education, Self Care, Joint mobilization, Cryotherapy, Traction, and Manual therapy   PLAN FOR NEXT SESSION: Postural correction and strengthening (probable L RTC tear), scapular, RTC and cervical strengthening.  Continue shoulder capsular stretching. Manual therapy as needed.      Kearney Hard, PT, MPT 06/28/22 3:35 PM   06/28/2022, 3:35 PM

## 2022-06-30 ENCOUNTER — Ambulatory Visit (INDEPENDENT_AMBULATORY_CARE_PROVIDER_SITE_OTHER): Payer: Medicare Other | Admitting: Rehabilitative and Restorative Service Providers"

## 2022-06-30 ENCOUNTER — Encounter: Payer: Self-pay | Admitting: Rehabilitative and Restorative Service Providers"

## 2022-06-30 DIAGNOSIS — M542 Cervicalgia: Secondary | ICD-10-CM | POA: Diagnosis not present

## 2022-06-30 DIAGNOSIS — M6281 Muscle weakness (generalized): Secondary | ICD-10-CM | POA: Diagnosis not present

## 2022-06-30 DIAGNOSIS — M25511 Pain in right shoulder: Secondary | ICD-10-CM

## 2022-06-30 DIAGNOSIS — M25512 Pain in left shoulder: Secondary | ICD-10-CM | POA: Diagnosis not present

## 2022-06-30 NOTE — Therapy (Signed)
OUTPATIENT PHYSICAL THERAPY TREATMENT/DISCHARGE NOTE   Patient Name: Frank Johnston MRN: [id number] DOB:November 10, 1940, 81 y.o., male Today's Date: 06/30/2022  PCP: [name], MD REFERRING PROVIDER: Jean Rosenthal, MD  PHYSICAL THERAPY DISCHARGE SUMMARY  Visits from Start of Care: 7  Current functional level related to goals / functional outcomes: See note   Remaining deficits: See note   Education / Equipment: Updated HEP   Patient agrees to discharge. Patient goals were met. Patient is being discharged due to being pleased with the current functional level.   END OF SESSION:   PT End of Session - 06/30/22 1523     Visit Number 7    Number of Visits 16    Date for PT Re-Evaluation 08/05/22    PT Start Time 1430    PT Stop Time 1523    PT Time Calculation (min) 53 min    Activity Tolerance Patient tolerated treatment well;No increased pain    Behavior During Therapy [facility] for tasks assessed/performed             Past Medical History:  Diagnosis Date   Arthritis    Chest pain 10/02/2015   Chronic kidney disease    hx kidney stones   GERD (gastroesophageal reflux disease)    H/O hiatal hernia    Hypertension    Wears glasses    Past Surgical History:  Procedure Laterality Date   25 GAUGE PARS PLANA VITRECTOMY WITH 20 GAUGE MVR PORT Right 09/06/2016   Procedure: 25 GAUGE PARS PLANA VITRECTOMY WITH 20 GAUGE MVR PORT;  Surgeon: [name], MD;  Location: [place];  Service: Ophthalmology;  Laterality: Right;   [place]   lumbar lam   CARPAL TUNNEL RELEASE  12/06/2011   Procedure: CARPAL TUNNEL RELEASE;  Surgeon: Wynonia Sours, MD;  Location: [place];  Service: Orthopedics;  Laterality: Right;   CARPAL TUNNEL RELEASE  08/07/2012   Procedure: CARPAL TUNNEL RELEASE;  Surgeon: Wynonia Sours, MD;  Location: [place];  Service: Orthopedics;  Laterality: Left;  carpal tunnel release    CHOLECYSTECTOMY  1976   COLONOSCOPY     JOINT REPLACEMENT  4/12   lt total knee   KNEE ARTHROSCOPY     rt and lt   KNEE CLOSED REDUCTION  6/12   lt -after lt total knee   TRIGGER FINGER RELEASE  12/06/2011   Procedure: RELEASE TRIGGER FINGER/A-1 PULLEY;  Surgeon: Wynonia Sours, MD;  Location: [place];  Service: Orthopedics;  Laterality: Right;  right thumb, middle, and ring fingers   TRIGGER FINGER RELEASE  08/07/2012   Procedure: RELEASE TRIGGER FINGER/A-1 PULLEY;  Surgeon: Wynonia Sours, MD;  Location: [place];  Service: Orthopedics;  Laterality: Left;  trigger release left ring finger   Patient Active Problem List   Diagnosis Date Noted   Surgery follow-up examination 01/18/2021   Pain due to onychomycosis of toenails of both feet 04/04/2019   History of adenomatous polyp of colon 01/08/2018   Preretinal fibrosis, right eye 09/06/2016   Dyspnea 02/04/2016   Chest pain 10/02/2015   DVT (deep venous thrombosis) ([place]) 03/02/2014   Pulmonary embolism on left ([place]) 02/28/2014   Knee pain, right 02/28/2014   PE (pulmonary embolism) 02/28/2014   Essential hypertension 01/19/2008   DYSPEPSIA 01/19/2008   ILEUS 01/19/2008   RENAL CALCULUS 01/19/2008   ARTHRITIS 01/19/2008   COLONIC POLYPS 08/01/2005  INTERNAL HEMORRHOIDS 08/01/2005    REFERRING DIAG: M54.2 (ICD-10-CM) - Cervicalgia   THERAPY DIAG:  Muscle weakness (generalized)  Cervicalgia  Left shoulder pain, unspecified chronicity  Right shoulder pain, unspecified chronicity  Rationale for Evaluation and Treatment Rehabilitation  PERTINENT HISTORY: OA, chronic kidney disease, HTN, previous lumbar laminectomy, previous L TKA, R knee scope, B CT releases  PRECAUTIONS: None (Overall arthritis)  SUBJECTIVE: Frank Johnston reports his neck and shoulder pain has been "a 1-2/10 pain" at worst this week.  He has not been doing heavier physical work and would like to get back to it.  PAIN:  Are you  having pain? 0-2/10 Aggravating factors: nothing Relieving factors: medicine, rest    OBJECTIVE: (objective measures completed at initial evaluation unless otherwise dated)  DIAGNOSTIC FINDINGS:  IMPRESSION: 1. Multilevel cervical spondylosis with resultant diffuse spinal stenosis at C3-4 through C5-6, most pronounced at C5-6 where stenosis is moderate in nature. 2. Multifactorial degenerative changes with resultant multilevel foraminal narrowing as above. Notable findings include severe right with moderate left C4 foraminal stenosis, severe left C5 foraminal narrowing, with moderate bilateral C6 foraminal stenosis. 3. Multilevel facet arthrosis, most pronounced on the left at C4-5 and bilaterally at C7-T1 where there is associated reactive marrow edema. Findings could contribute to neck pain.     PATIENT SURVEYS:  06/30/2022: FOTO 63 (Goal 57) 06/10/22: FOTO 37 (Goal 57) in 12 visits     COGNITION: Overall cognitive status: Within functional limits for tasks assessed     SENSATION: Frank Johnston notes R > L pain to the elbow, may be impingement at the shoulder   POSTURE: rounded shoulders, forward head, and decreased lumbar lordosis     CERVICAL ROM:    Active ROM A/PROM (deg) eval AROM 06/20/22  AROM 06/28/22 AROM 06/30/22  Flexion   50    Extension 30 32 40 60  Right lateral flexion       Left lateral flexion       Right rotation 25 45 45 40  Left rotation 40 52 52 55   (Blank rows = not tested)   UPPER EXTREMITY ROM:   Passive ROM Right eval Left eval L/R in degrees Left/Right Passive 06/28/22 sitting Left/Right 06/30/2022 Supine Passive  Shoulder flexion 145 135 155/150 150/148 160/150  Shoulder extension         Shoulder abduction         Shoulder horizontal adduction 40 30   35/40  Shoulder extension         Shoulder internal rotation 30 20   60/60  Shoulder external rotation 75 70   70/85  Elbow flexion         Elbow extension         Wrist flexion          Wrist extension         Wrist ulnar deviation         Wrist radial deviation         Wrist pronation         Wrist supination          (Blank rows = not tested)   Strength (in pounds assessed with hand held dynamometer):   MMT Right eval Left eval Right 06/28/22 Left  06/28/22 Seated  Left 06/30/2022  Shoulder flexion         Shoulder extension         Shoulder abduction         Cervical lateral bending 8.4 13.2   (  Lt/Rt) 17.0/27.9  Cervical extension 18.7     29.1  Shoulder internal rotation 18.9 18.8 26.2 29.0 42.4  Shoulder external rotation 17.3 5.4 20.1 2.0 5.9  Middle trapezius         Lower trapezius         Elbow flexion         Elbow extension         Wrist flexion         Wrist extension         Wrist ulnar deviation         Wrist radial deviation         Wrist pronation         Wrist supination         Grip strength          (Blank rows = not tested)   TODAY'S TREATMENT:  06/30/22: Shoulder blade pinches 10X 5 seconds Scapular protraction 20X 3 seconds with 5# weights (reach only) Theraband ER shoulder Yellow Lt and Green Rt 10X Bil Cervical Extension Isometrics into a pillow seated 10X 5 seconds  Pull to chest Blue 20X 3 seconds Cervical AROM Rotation 10X 5 seconds Supine shoulder IE/ER stretch (70 degrees abduction, both elbows on pillows) 10X 10 seconds each direction Bil Supine shoulder flexion 10X 10 seconds Bil (palms in, protract 1st)   06/28/22:  TherEx:  UBE: level 2  x 2 minutes each direction BATCA: rows 15# 2 x 15 BATCA: lat pull downs: 10# x 10, 15# x 15 BATCA: shoulder extension 5# 2 x 10 bil UE's Upper Trap stretch: x 5 holding sec bil Wall push ups 2 x 10 Walking red ball up the wall x 10 with stretch at end range shoulder flexion and holding 3-5 seconds Manual:  IASTM using Biofreeze to bilateral upper trap, levator scapula, rhomboids and infraspinatus Modalities:  Moist heat x 5 minutes to cervical spine and bil upper  traps   06/23/22 TherEx:  UBE L3 x3 min forward/3 min backward  Chin tucks x10 Chin tucks with rotation x10 B  Chin tucks with lateral flexion x10 B Thoracic excursions x10 each direction Scapular retractions 10# 2x12 Shoulder extensions 15# 2x12 UE ranger AAROM: flexion x15, scaption x15 to tolerated ROM UE ranger circles x10 CW/10 CCW L UE     PATIENT EDUCATION:  Education details: Reviewed imaging and HEP Person educated: Patient Education method: Explanation, Demonstration, Tactile cues, Verbal cues, and Handouts Education comprehension: verbalized understanding, returned demonstration, verbal cues required, tactile cues required, and needs further education     HOME EXERCISE PROGRAM: Access Code: [email or web address] URL: [email or web address] Date: 06/30/2022 Prepared by: Vista Mink  Exercises - Supine Scapular Protraction in Flexion with Dumbbells  - 1 x daily - 3 x weekly - 1 sets - 20 reps - 3 seconds hold - Standing Scapular Retraction  - 5 x daily - 7 x weekly - 1 sets - 5 reps - 5 second hold - Standing Isometric Cervical Extension with Manual Resistance  - 5 x daily - 7 x weekly - 1 sets - 5 reps - 5 hold - Shoulder External Rotation with Anchored Resistance  - 1 x daily - 3 x weekly - 2 sets - 10 reps - 3 hold - Seated Cervical Rotation AROM  - 1-2 x daily - 7 x weekly - 1 sets - 10 reps - 5 seconds hold - Standing Shoulder Row with Anchored Resistance  - 1 x daily - 3 x weekly - 1  sets - 20 reps - 3 seconds hold - Supine Shoulder Flexion AAROM with [place]  - 1 x daily - 3 x weekly - 1 sets - 10 reps - 5-10 seconds hold   ASSESSMENT:   CLINICAL IMPRESSION:  Frank Johnston has met, or is very close to meeting all long-term goals.  Pain is minimal, objective AROM and strength progress impressive and functional goals met.  His HEP was updated and he appears ready to transfer into more independent rehabilitation with DC from supervised PT.   OBJECTIVE IMPAIRMENTS  decreased activity tolerance, decreased endurance, decreased knowledge of condition, decreased ROM, decreased strength, decreased safety awareness, impaired perceived functional ability, impaired flexibility, impaired UE functional use, improper body mechanics, postural dysfunction, and pain.    ACTIVITY LIMITATIONS carrying, lifting, bending, sitting, standing, squatting, sleeping, bed mobility, dressing, and reach over head   PARTICIPATION LIMITATIONS: meal prep, cleaning, driving, community activity, and yard work   PERSONAL FACTORS OA, chronic kidney disease, HTN, previous lumbar laminectomy, previous L TKA, R knee scope, and B CT releases are also affecting patient's functional outcome.    REHAB POTENTIAL: Good   CLINICAL DECISION MAKING: Stable/uncomplicated   EVALUATION COMPLEXITY: Moderate     GOALS: Goals reviewed with patient? Yes   SHORT TERM GOALS: Target date: 07/08/2022    Improve cervical AROM for extension to 50 degrees and rotation to 50/50 degrees Baseline: 30 and 25/40 degrees Goal status: Partially Met 06/30/22   2.  Improve cervical extension strength to at least 28 pounds Baseline: 18.7 pounds Goal status: Met 06/30/2022   3.  Improve shoulder AROM for ER & IR to 80/80 and 50/50 degrees Bil Baseline: 70/75 and 20/30 degrees Goal status: Partially Met 06/30/2022   4.  Improve shoulder flexion AROM to 150 degrees Bil Baseline: 135/145 Goal status: Met 06/16/2022     LONG TERM GOALS: Target date: 08/05/2022   Improve FOTO to 57 Baseline: 37 Goal status: Met 06/30/2022   2.  Improve cervical and scapular pain to consistently 0-3/10 on the Numeric Pain Rating Scale Baseline: 3-6/10 Goal status: Met 06/30/22   3.  Improve cervical extensors and lateral bending strength to 35 and 18/18 pounds Baseline: 18.7 and 13.2/8.4 pounds Goal status: Partially Met 06/30/22   4.  Improve shoulder ER strength to 20 pounds and IR strength to 25 pounds Bil Baseline:  5.4/17.3 pounds and 18.8/18.9 pounds Goal status: Partally Met 06/30/2022   5.  Frank Johnston will be independent with his long-term HEP at DC Baseline: Started 06/10/2022 Goal status: Met 06/30/2022     PLAN: PT FREQUENCY: DC   PT DURATION: DC   PLANNED INTERVENTIONS: Therapeutic exercises, Therapeutic activity, Patient/Family education, Self Care, Joint mobilization, Cryotherapy, Traction, and Manual therapy   PLAN FOR NEXT SESSION: DC     Farley Ly, PT, MPT 06/30/22 3:31 PM   06/30/2022, 3:31 PM

## 2022-07-05 ENCOUNTER — Encounter: Payer: Medicare Other | Admitting: Physical Therapy

## 2022-07-07 ENCOUNTER — Encounter: Payer: Medicare Other | Admitting: Rehabilitative and Restorative Service Providers"

## 2022-07-07 ENCOUNTER — Ambulatory Visit: Payer: Medicare Other | Admitting: Podiatry

## 2022-07-16 DIAGNOSIS — Z23 Encounter for immunization: Secondary | ICD-10-CM | POA: Diagnosis not present

## 2022-07-25 DIAGNOSIS — H40013 Open angle with borderline findings, low risk, bilateral: Secondary | ICD-10-CM | POA: Diagnosis not present

## 2022-08-08 ENCOUNTER — Encounter: Payer: Self-pay | Admitting: Podiatry

## 2022-08-08 ENCOUNTER — Ambulatory Visit: Payer: Medicare Other | Admitting: Podiatry

## 2022-08-08 DIAGNOSIS — L608 Other nail disorders: Secondary | ICD-10-CM | POA: Diagnosis not present

## 2022-08-08 DIAGNOSIS — B351 Tinea unguium: Secondary | ICD-10-CM

## 2022-08-08 DIAGNOSIS — M79674 Pain in right toe(s): Secondary | ICD-10-CM

## 2022-08-08 DIAGNOSIS — M79675 Pain in left toe(s): Secondary | ICD-10-CM

## 2022-08-08 NOTE — Progress Notes (Signed)
This patient returns to my office for at risk foot care.  This patient requires this care by a professional since this patient will be at risk due to having history of DVT.    This patient is unable to cut nails himself since the patient cannot reach his nails.These nails are painful walking and wearing shoes.  This patient presents for at risk foot care today.   General Appearance  Alert, conversant and in no acute stress.  Vascular  Dorsalis pedis and posterior tibial  pulses are palpable  bilaterally.  Capillary return is within normal limits  bilaterally. Temperature is within normal limits  bilaterally.  Neurologic  Senn-Weinstein monofilament wire test within normal limits  bilaterally. Muscle power within normal limits bilaterally.  Nails Thick disfigured discolored nails with subungual debris  from hallux to fifth toes bilaterally.    Orthopedic  No limitations of motion  feet .  No crepitus or effusions noted.  No bony pathology or digital deformities noted.  Skin  normotropic skin with no porokeratosis noted bilaterally.  No signs of infections or ulcers noted.     Onychomycosis  Pain in right toes  Pain in left toes     Consent was obtained for treatment procedures.   Mechanical debridement of nails 1-5  bilaterally performed with a nail nipper.  Filed with dremel without incident.    Return office visit   9   weeks                  Told patient to return for periodic foot care and evaluation due to potential at risk complications.   Gardiner Barefoot DPM

## 2022-09-13 DIAGNOSIS — L821 Other seborrheic keratosis: Secondary | ICD-10-CM | POA: Diagnosis not present

## 2022-09-13 DIAGNOSIS — Z85828 Personal history of other malignant neoplasm of skin: Secondary | ICD-10-CM | POA: Diagnosis not present

## 2022-09-13 DIAGNOSIS — D692 Other nonthrombocytopenic purpura: Secondary | ICD-10-CM | POA: Diagnosis not present

## 2022-09-13 DIAGNOSIS — D225 Melanocytic nevi of trunk: Secondary | ICD-10-CM | POA: Diagnosis not present

## 2022-10-12 ENCOUNTER — Ambulatory Visit: Payer: Medicare Other | Admitting: Physician Assistant

## 2022-10-12 ENCOUNTER — Encounter: Payer: Self-pay | Admitting: Physician Assistant

## 2022-10-12 ENCOUNTER — Ambulatory Visit (INDEPENDENT_AMBULATORY_CARE_PROVIDER_SITE_OTHER): Payer: Medicare Other

## 2022-10-12 DIAGNOSIS — M19042 Primary osteoarthritis, left hand: Secondary | ICD-10-CM

## 2022-10-12 DIAGNOSIS — M25532 Pain in left wrist: Secondary | ICD-10-CM | POA: Diagnosis not present

## 2022-10-12 DIAGNOSIS — M25432 Effusion, left wrist: Secondary | ICD-10-CM | POA: Diagnosis not present

## 2022-10-12 NOTE — Progress Notes (Signed)
Office Visit Note   Patient: Frank Johnston           Date of Birth: 26-Sep-1941           MRN: [id number] Visit Date: 10/12/2022              Requested by: [name], MD [address] [place],  [place] [address] PCP: [name], MD  Chief Complaint  Patient presents with   Left Wrist - Pain      HPI: Frank Johnston is a pleasant right-hand-dominant 82 year old gentleman with a chief complaint of pain in the base of his left thumb with some associated swelling.  He denies any injury.  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  Denies any fever or chills.  Denies any previous history of pain in his left hand.  He cannot take anti-inflammatories because he has chronic kidney disease.   He does take hydrocodone for chronic pain management Assessment & Plan: Visit Diagnoses:  1. Pain and swelling of left wrist   2. Arthritis of left hand     Plan: Severe advanced arthritis of the first [facility] joint.  He also has degenerative changes of the other carpal joints.  Seems most symptomatic over the first [facility].  I recommend Voltaren gel and will place him in a thumb spica splint to see if we can calm this down.  Given the severity of his arthritis I will also refer him to Dr. Tempie Donning for possible injections.  Follow-Up Instructions: No follow-ups on file.   Ortho Exam  Patient is alert, oriented, no adenopathy, well-dressed, normal affect, normal respiratory effort. Examination of his left thumb he has a strong radial pulse mild soft tissue swelling some mild atrophy noted in his hand he is able to oppose all his fingers no redness brisk capillary refill.  He is focally tender over the first [facility] joint.  Mildly tender over the carpal radial joint sensation is intact  Imaging: XR Wrist Complete Left  Result Date: 10/12/2022 X-rays of his left wrist were obtained today.  He has significant degenerative changes with cyst formation and subluxation of the first [facility]  joint.  No images are attached to the encounter.  Labs: Lab Results  Component Value Date   REPTSTATUS 01/01/2011 FINAL 12/29/2010   GRAMSTAIN  12/29/2010    RARE WBC PRESENT, PREDOMINANTLY MONONUCLEAR NO ORGANISMS SEEN   CULT NO GROWTH 3 DAYS 12/29/2010     Lab Results  Component Value Date   ALBUMIN 3.9 02/17/2019   ALBUMIN 3.5 02/27/2014   ALBUMIN 4.1 03/11/2011    No results found for: "MG" No results found for: "VD25OH"  No results found for: "PREALBUMIN"    Latest Ref Rng & Units 02/17/2019    9:31 PM 09/06/2016    9:16 AM 03/02/2014    4:05 AM  CBC EXTENDED  WBC 4.0 - 10.5 K/uL 14.6  10.3  12.4   RBC 4.22 - 5.81 MIL/uL 4.99  5.60  4.53   Hemoglobin 13.0 - 17.0 g/dL 14.6  16.5  13.3   HCT 39.0 - 52.0 % 43.0  47.2  39.1   Platelets 150 - 400 K/uL 196  256  218   NEUT# 1.7 - 7.7 K/uL 11.4     Lymph# 0.7 - 4.0 K/uL 1.5        There is no height or weight on file to calculate BMI.  Orders:  Orders Placed This Encounter  Procedures   XR  Wrist Complete Left   Ambulatory referral to Orthopedic Surgery   No orders of the defined types were placed in this encounter.    Procedures: No procedures performed  Clinical Data: No additional findings.  ROS:  All other systems negative, except as noted in the HPI. Review of Systems  Objective: Vital Signs: There were no vitals taken for this visit.  Specialty Comments:  No specialty comments available.  PMFS History: Patient Active Problem List   Diagnosis Date Noted   Arthritis of left hand 10/12/2022   Surgery follow-up examination 01/18/2021   Pain due to onychomycosis of toenails of both feet 04/04/2019   History of adenomatous polyp of colon 01/08/2018   Preretinal fibrosis, right eye 09/06/2016   Dyspnea 02/04/2016   Chest pain 10/02/2015   DVT (deep venous thrombosis) ([place]) 03/02/2014   Pulmonary embolism on left ([place]) 02/28/2014   Knee pain, right 02/28/2014   PE (pulmonary embolism)  02/28/2014   Essential hypertension 01/19/2008   DYSPEPSIA 01/19/2008   ILEUS 01/19/2008   RENAL CALCULUS 01/19/2008   ARTHRITIS 01/19/2008   COLONIC POLYPS 08/01/2005   INTERNAL HEMORRHOIDS 08/01/2005   Past Medical History:  Diagnosis Date   Arthritis    Chest pain 10/02/2015   Chronic kidney disease    hx kidney stones   GERD (gastroesophageal reflux disease)    H/O hiatal hernia    Hypertension    Wears glasses     Family History  Problem Relation Age of Onset   Stroke Mother    Cancer Father    Hypertension Child    Hypertension Child     Past Surgical History:  Procedure Laterality Date   25 GAUGE PARS PLANA VITRECTOMY WITH 20 GAUGE MVR PORT Right 09/06/2016   Procedure: 25 GAUGE PARS PLANA VITRECTOMY WITH 20 GAUGE MVR PORT;  Surgeon: [name], MD;  Location: [place];  Service: Ophthalmology;  Laterality: Right;   [place]   lumbar lam   CARPAL TUNNEL RELEASE  12/06/2011   Procedure: CARPAL TUNNEL RELEASE;  Surgeon: Wynonia Sours, MD;  Location: [place];  Service: Orthopedics;  Laterality: Right;   CARPAL TUNNEL RELEASE  08/07/2012   Procedure: CARPAL TUNNEL RELEASE;  Surgeon: Wynonia Sours, MD;  Location: [place];  Service: Orthopedics;  Laterality: Left;  carpal tunnel release   CHOLECYSTECTOMY  1976   COLONOSCOPY     JOINT REPLACEMENT  4/12   lt total knee   KNEE ARTHROSCOPY     rt and lt   KNEE CLOSED REDUCTION  6/12   lt -after lt total knee   TRIGGER FINGER RELEASE  12/06/2011   Procedure: RELEASE TRIGGER FINGER/A-1 PULLEY;  Surgeon: Wynonia Sours, MD;  Location: [place];  Service: Orthopedics;  Laterality: Right;  right thumb, middle, and ring fingers   TRIGGER FINGER RELEASE  08/07/2012   Procedure: RELEASE TRIGGER FINGER/A-1 PULLEY;  Surgeon: Wynonia Sours, MD;  Location: [place];  Service: Orthopedics;  Laterality: Left;  trigger release left ring finger    Social History   Occupational History   Occupation: retired  Tobacco Use   Smoking status: Former    Packs/day: 1.00    Years: 40.00    Total pack years: 40.00    Types: Cigarettes    Quit date: 11/30/1994    Years since quitting: 27.8   Smokeless tobacco: Never  Vaping Use   Vaping  Use: Never used  Substance and Sexual Activity   Alcohol use: No    Alcohol/week: 0.0 standard drinks of alcohol   Drug use: No   Sexual activity: Not on file

## 2022-10-26 DIAGNOSIS — M1812 Unilateral primary osteoarthritis of first carpometacarpal joint, left hand: Secondary | ICD-10-CM | POA: Diagnosis not present

## 2022-10-27 ENCOUNTER — Encounter: Payer: Self-pay | Admitting: Podiatry

## 2022-10-27 ENCOUNTER — Ambulatory Visit (INDEPENDENT_AMBULATORY_CARE_PROVIDER_SITE_OTHER): Payer: Medicare Other | Admitting: Podiatry

## 2022-10-27 VITALS — BP 130/70 | HR 74

## 2022-10-27 DIAGNOSIS — M79674 Pain in right toe(s): Secondary | ICD-10-CM | POA: Diagnosis not present

## 2022-10-27 DIAGNOSIS — M79675 Pain in left toe(s): Secondary | ICD-10-CM

## 2022-10-27 DIAGNOSIS — B351 Tinea unguium: Secondary | ICD-10-CM

## 2022-10-27 NOTE — Progress Notes (Signed)

## 2022-10-28 DIAGNOSIS — K080 Exfoliation of teeth due to systemic causes: Secondary | ICD-10-CM | POA: Diagnosis not present

## 2023-01-23 ENCOUNTER — Ambulatory Visit: Payer: Medicare Other | Admitting: Podiatry

## 2023-01-25 DIAGNOSIS — K080 Exfoliation of teeth due to systemic causes: Secondary | ICD-10-CM | POA: Diagnosis not present

## 2023-01-26 ENCOUNTER — Ambulatory Visit: Payer: Medicare Other | Admitting: Podiatry

## 2023-01-26 DIAGNOSIS — H40013 Open angle with borderline findings, low risk, bilateral: Secondary | ICD-10-CM | POA: Diagnosis not present

## 2023-01-26 DIAGNOSIS — H524 Presbyopia: Secondary | ICD-10-CM | POA: Diagnosis not present

## 2023-01-26 DIAGNOSIS — H26493 Other secondary cataract, bilateral: Secondary | ICD-10-CM | POA: Diagnosis not present

## 2023-01-26 DIAGNOSIS — H35033 Hypertensive retinopathy, bilateral: Secondary | ICD-10-CM | POA: Diagnosis not present

## 2023-01-26 DIAGNOSIS — H35371 Puckering of macula, right eye: Secondary | ICD-10-CM | POA: Diagnosis not present

## 2023-01-31 DIAGNOSIS — K5909 Other constipation: Secondary | ICD-10-CM | POA: Diagnosis not present

## 2023-01-31 DIAGNOSIS — M1812 Unilateral primary osteoarthritis of first carpometacarpal joint, left hand: Secondary | ICD-10-CM | POA: Diagnosis not present

## 2023-01-31 DIAGNOSIS — I129 Hypertensive chronic kidney disease with stage 1 through stage 4 chronic kidney disease, or unspecified chronic kidney disease: Secondary | ICD-10-CM | POA: Diagnosis not present

## 2023-01-31 DIAGNOSIS — N1831 Chronic kidney disease, stage 3a: Secondary | ICD-10-CM | POA: Diagnosis not present

## 2023-02-09 ENCOUNTER — Ambulatory Visit: Payer: Medicare Other | Admitting: Podiatry

## 2023-02-09 ENCOUNTER — Encounter: Payer: Self-pay | Admitting: Podiatry

## 2023-02-09 DIAGNOSIS — M79674 Pain in right toe(s): Secondary | ICD-10-CM

## 2023-02-09 DIAGNOSIS — M79675 Pain in left toe(s): Secondary | ICD-10-CM | POA: Diagnosis not present

## 2023-02-09 DIAGNOSIS — B351 Tinea unguium: Secondary | ICD-10-CM | POA: Diagnosis not present

## 2023-02-14 NOTE — Progress Notes (Signed)
  Subjective:  Patient ID: Frank Johnston, male    DOB: 22-Nov-1940,  MRN: [id number]  Frank Johnston presents to clinic today for:  Chief Complaint  Patient presents with   Nail Problem    RFC,PCP: Garlan Fillers, MD,PCPLOV:per patient 1-2 weeks ago       PCP is Garlan Fillers, MD.  Allergies  Allergen Reactions   Amlodipine Swelling   No Known Allergies     Review of Systems: Negative except as noted in the HPI.  Objective: No changes noted in today's physical examination. There were no vitals filed for this visit.  Frank Johnston is a pleasant 82 y.o. male in NAD. AAO x 3.  Vascular Examination: Capillary refill time <3 seconds b/l LE. Palpable pedal pulses b/l LE. Digital hair present b/l. No pedal edema b/l. Skin temperature gradient WNL b/l. No varicosities b/l. Marland Kitchen  Dermatological Examination: Pedal skin with normal turgor, texture and tone b/l. No open wounds. No interdigital macerations b/l. Toenails 1-5 b/l thickened, discolored, dystrophic with subungual debris. There is pain on palpation to dorsal aspect of nailplates. No hyperkeratotic nor porokeratotic lesions present on today's visit.Marland Kitchen  Neurological Examination: Protective sensation intact 5/5 intact bilaterally with 10g monofilament b/l. Vibratory sensation intact b/l. Proprioception intact bilaterally.  Musculoskeletal Examination: Muscle strength 5/5 to all LE muscle groups b/l. No pain, crepitus or joint limitation noted with ROM bilateral LE. No gross bony deformities bilaterally.  Assessment/Plan: 1. Pain due to onychomycosis of toenails of both feet      Patient was evaluated and treated. All patient's and/or POA's questions/concerns addressed on today's visit. Toenails 1-5 debrided in length and girth without incident. Continue soft, supportive shoe gear daily. Report any pedal injuries to medical professional. -Patient/POA to call should there be question/concern in the interim.    Return in about 3 months (around 05/12/2023).  Freddie Breech, DPM

## 2023-04-26 DIAGNOSIS — H524 Presbyopia: Secondary | ICD-10-CM | POA: Diagnosis not present

## 2023-05-11 ENCOUNTER — Ambulatory Visit: Payer: Medicare Other | Admitting: Podiatry

## 2023-05-23 DIAGNOSIS — K080 Exfoliation of teeth due to systemic causes: Secondary | ICD-10-CM | POA: Diagnosis not present

## 2023-06-22 DIAGNOSIS — Z125 Encounter for screening for malignant neoplasm of prostate: Secondary | ICD-10-CM | POA: Diagnosis not present

## 2023-06-22 DIAGNOSIS — E785 Hyperlipidemia, unspecified: Secondary | ICD-10-CM | POA: Diagnosis not present

## 2023-06-22 DIAGNOSIS — N1831 Chronic kidney disease, stage 3a: Secondary | ICD-10-CM | POA: Diagnosis not present

## 2023-06-22 DIAGNOSIS — I129 Hypertensive chronic kidney disease with stage 1 through stage 4 chronic kidney disease, or unspecified chronic kidney disease: Secondary | ICD-10-CM | POA: Diagnosis not present

## 2023-06-29 DIAGNOSIS — I10 Essential (primary) hypertension: Secondary | ICD-10-CM | POA: Diagnosis not present

## 2023-06-29 DIAGNOSIS — I129 Hypertensive chronic kidney disease with stage 1 through stage 4 chronic kidney disease, or unspecified chronic kidney disease: Secondary | ICD-10-CM | POA: Diagnosis not present

## 2023-06-29 DIAGNOSIS — Z23 Encounter for immunization: Secondary | ICD-10-CM | POA: Diagnosis not present

## 2023-06-29 DIAGNOSIS — Z0000 Encounter for general adult medical examination without abnormal findings: Secondary | ICD-10-CM | POA: Diagnosis not present

## 2023-06-29 DIAGNOSIS — Z1331 Encounter for screening for depression: Secondary | ICD-10-CM | POA: Diagnosis not present

## 2023-06-29 DIAGNOSIS — R82998 Other abnormal findings in urine: Secondary | ICD-10-CM | POA: Diagnosis not present

## 2023-06-29 DIAGNOSIS — Z1339 Encounter for screening examination for other mental health and behavioral disorders: Secondary | ICD-10-CM | POA: Diagnosis not present

## 2023-07-07 ENCOUNTER — Encounter: Payer: Self-pay | Admitting: Podiatry

## 2023-07-07 ENCOUNTER — Ambulatory Visit: Payer: Medicare Other | Admitting: Podiatry

## 2023-07-07 DIAGNOSIS — M79675 Pain in left toe(s): Secondary | ICD-10-CM | POA: Diagnosis not present

## 2023-07-07 DIAGNOSIS — M79674 Pain in right toe(s): Secondary | ICD-10-CM

## 2023-07-07 DIAGNOSIS — B351 Tinea unguium: Secondary | ICD-10-CM | POA: Diagnosis not present

## 2023-07-11 NOTE — Progress Notes (Signed)
Subjective:  Patient ID: Frank Johnston, male    DOB: Feb 29, 1941,  MRN: [id number]  Frank Johnston presents to clinic today for: painful elongated mycotic toenails 1-5 bilaterally which are tender when wearing enclosed shoe gear. Pain is relieved with periodic professional debridement.   PCP is Garlan Fillers, MD.  Allergies  Allergen Reactions   Amlodipine Swelling   No Known Allergies     Review of Systems: Negative except as noted in the HPI.  Objective: No changes noted in today's physical examination. There were no vitals filed for this visit.  Frank Johnston is a pleasant 82 y.o. male in NAD. AAO x 3.  Vascular Examination: Capillary refill time immediate b/l. Vascular status intact b/l with palpable pedal pulses. Pedal hair present b/l. No pain with calf compression b/l. Skin temperature gradient WNL b/l. No cyanosis or clubbing b/l. No ischemia or gangrene noted b/l.   Neurological Examination: Sensation grossly intact b/l with 10 gram monofilament. Vibratory sensation intact b/l.   Dermatological Examination: Pedal skin with normal turgor, texture and tone b/l.  No open wounds. No interdigital macerations.   Toenails 1-5 b/l thick, discolored, elongated with subungual debris and pain on dorsal palpation.   No corns, calluses nor porokeratotic lesions noted.  Musculoskeletal Examination: Normal muscle strength 5/5 to all lower extremity muscle groups bilaterally. No pain, crepitus or joint limitation noted with ROM b/l LE. No gross bony pedal deformities b/l. Patient ambulates independently without assistive aids.  Radiographs: None  Last A1c:       No data to display         Assessment/Plan: 1. Pain due to onychomycosis of toenails of both feet     Patient was evaluated and treated. All patient's and/or POA's questions/concerns addressed on today's visit. Mycotic toenails 1-5 debrided in length and girth without incident. Continue soft,  supportive shoe gear daily. Report any pedal injuries to medical professional. Call office if there are any quesitons/concerns. -Patient/POA to call should there be question/concern in the interim.   Return in about 3 months (around 10/07/2023).  Freddie Breech, DPM

## 2023-08-02 DIAGNOSIS — M1611 Unilateral primary osteoarthritis, right hip: Secondary | ICD-10-CM | POA: Diagnosis not present

## 2023-08-02 DIAGNOSIS — M1711 Unilateral primary osteoarthritis, right knee: Secondary | ICD-10-CM | POA: Diagnosis not present

## 2023-08-03 ENCOUNTER — Telehealth: Payer: Self-pay

## 2023-08-03 NOTE — Telephone Encounter (Signed)
Pre-operative Risk Assessment    Patient Name: Frank Johnston  DOB: 1941/07/26 MRN: [id number]  Last ov: 12/06/2023 Upcoming visit: Unknown       Request for Surgical Clearance    Procedure:   Right total hip arthroplasty  Date of Surgery:  Clearance 12/06/23                                 Surgeon:  Dr. Ollen Gross Surgeon's Group or Practice Name:  [facility]  Phone number:  [phone]  Fax number:  [phone]   Type of Clearance Requested:   - Medical  - Pharmacy:  Hold Aspirin not indicated    Type of Anesthesia:  Not Indicated   Additional requests/questions:    Vance Peper   08/03/2023, 2:39 PM

## 2023-08-04 NOTE — Telephone Encounter (Signed)
   Name: BRITT PETRONI  DOB: [birth year]  MRN: [id number]  Primary Cardiologist: None  Chart reviewed as part of pre-operative protocol coverage. Because of Yaniel Limbaugh Grigorian's past medical history and time since last visit, he will require a follow-up in-office visit in order to better assess preoperative cardiovascular risk.  Pre-op covering staff: - Please schedule appointment and call patient to inform them. If patient already had an upcoming appointment within acceptable timeframe, please add "pre-op clearance" to the appointment notes so provider is aware. - Please contact requesting surgeon's office via preferred method (i.e, phone, fax) to inform them of need for appointment prior to surgery.   Carlos Levering, NP  08/04/2023, 3:12 PM

## 2023-08-04 NOTE — Telephone Encounter (Signed)
Called and spoke to patient schedule office visit 11/13/23 at 1:30pm for pre-op clearance patient voiced understanding.

## 2023-08-22 DIAGNOSIS — H40013 Open angle with borderline findings, low risk, bilateral: Secondary | ICD-10-CM | POA: Diagnosis not present

## 2023-09-15 DIAGNOSIS — M1611 Unilateral primary osteoarthritis, right hip: Secondary | ICD-10-CM | POA: Diagnosis not present

## 2023-09-20 DIAGNOSIS — K080 Exfoliation of teeth due to systemic causes: Secondary | ICD-10-CM | POA: Diagnosis not present

## 2023-10-09 ENCOUNTER — Encounter: Payer: Self-pay | Admitting: Podiatry

## 2023-10-09 ENCOUNTER — Ambulatory Visit: Payer: Medicare Other | Admitting: Podiatry

## 2023-10-09 DIAGNOSIS — M79675 Pain in left toe(s): Secondary | ICD-10-CM | POA: Diagnosis not present

## 2023-10-09 DIAGNOSIS — B351 Tinea unguium: Secondary | ICD-10-CM

## 2023-10-09 DIAGNOSIS — M79674 Pain in right toe(s): Secondary | ICD-10-CM | POA: Diagnosis not present

## 2023-10-10 DIAGNOSIS — L812 Freckles: Secondary | ICD-10-CM | POA: Diagnosis not present

## 2023-10-10 DIAGNOSIS — D225 Melanocytic nevi of trunk: Secondary | ICD-10-CM | POA: Diagnosis not present

## 2023-10-10 DIAGNOSIS — Z85828 Personal history of other malignant neoplasm of skin: Secondary | ICD-10-CM | POA: Diagnosis not present

## 2023-10-10 DIAGNOSIS — C44319 Basal cell carcinoma of skin of other parts of face: Secondary | ICD-10-CM | POA: Diagnosis not present

## 2023-10-10 DIAGNOSIS — L578 Other skin changes due to chronic exposure to nonionizing radiation: Secondary | ICD-10-CM | POA: Diagnosis not present

## 2023-10-10 DIAGNOSIS — L570 Actinic keratosis: Secondary | ICD-10-CM | POA: Diagnosis not present

## 2023-10-11 DIAGNOSIS — M542 Cervicalgia: Secondary | ICD-10-CM | POA: Diagnosis not present

## 2023-10-15 NOTE — Progress Notes (Signed)
  Subjective:  Patient ID: Frank Johnston, male    DOB: 10/26/1940,  MRN: [id number]  83 y.o. male presents painful thick toenails that are difficult to trim. Pain interferes with ambulation. Aggravating factors include wearing enclosed shoe gear. Pain is relieved with periodic professional debridement.  New problem(s): None   PCP is Yolande Toribio MATSU, MD  Allergies  Allergen Reactions   Amlodipine  Swelling   No Known Allergies     Review of Systems: Negative except as noted in the HPI.   Objective:  Frank Johnston is a pleasant 83 y.o. male WD, WN in NAD. AAO x 3.  Vascular Examination: Vascular status intact b/l with palpable pedal pulses. CFT immediate b/l. Pedal hair present. No edema. No pain with calf compression b/l. Skin temperature gradient WNL b/l. No varicosities noted. No cyanosis or clubbing noted.  Neurological Examination: Sensation grossly intact b/l with 10 gram monofilament. Vibratory sensation intact b/l.  Dermatological Examination: Pedal skin with normal turgor, texture and tone b/l. No open wounds nor interdigital macerations noted. Toenails 1-5 b/l thick, discolored, elongated with subungual debris and pain on dorsal palpation. No hyperkeratotic lesions noted b/l.   Musculoskeletal Examination: Muscle strength 5/5 to b/l LE.  No pain, crepitus noted b/l. No gross pedal deformities. Patient ambulates independently without assistive aids.   Radiographs: None  Assessment:   1. Pain due to onychomycosis of toenails of both feet    Plan:  -Consent given for treatment as described below: -Examined patient. -Continue supportive shoe gear daily. -Mycotic toenails 1-5 bilaterally were debrided in length and girth with sterile nail nippers and dremel. Right 2nd toe with pinch bruise sustained during debridement. No iatrogenic bleeding. Monitor. -Patient/POA to call should there be question/concern in the interim.  Return in about 3 months (around  01/07/2024).  Frank Johnston Merlin, DPM      [place] LOCATION: [address] [place] [address]                   Office [phone]   [facility] LOCATION: [address] [place], [place] [address] Office [phone]

## 2023-10-26 DIAGNOSIS — M542 Cervicalgia: Secondary | ICD-10-CM | POA: Diagnosis not present

## 2023-11-01 DIAGNOSIS — M542 Cervicalgia: Secondary | ICD-10-CM | POA: Diagnosis not present

## 2023-11-03 DIAGNOSIS — M542 Cervicalgia: Secondary | ICD-10-CM | POA: Diagnosis not present

## 2023-11-07 DIAGNOSIS — M542 Cervicalgia: Secondary | ICD-10-CM | POA: Diagnosis not present

## 2023-11-09 DIAGNOSIS — M542 Cervicalgia: Secondary | ICD-10-CM | POA: Diagnosis not present

## 2023-11-13 ENCOUNTER — Encounter: Payer: Self-pay | Admitting: Emergency Medicine

## 2023-11-13 ENCOUNTER — Ambulatory Visit: Payer: Medicare Other | Attending: Nurse Practitioner | Admitting: Emergency Medicine

## 2023-11-13 VITALS — BP 136/70 | HR 67 | Ht 69.0 in | Wt 171.0 lb

## 2023-11-13 DIAGNOSIS — I10 Essential (primary) hypertension: Secondary | ICD-10-CM

## 2023-11-13 DIAGNOSIS — Z01810 Encounter for preprocedural cardiovascular examination: Secondary | ICD-10-CM

## 2023-11-13 NOTE — Patient Instructions (Addendum)
 Medication Instructions:  Your physician recommends that you continue on your current medications as directed. Please refer to the Current Medication list given to you today.  *If you need a refill on your cardiac medications before your next appointment, please call your pharmacy*   Lab Work: NONE ordered at this time of appointment    Testing/Procedures: NONE ordered at this time of appointment    Follow-Up: At [facility], you and your health needs are our priority.  As part of our continuing mission to provide you with exceptional heart care, we have created designated Provider Care Teams.  These Care Teams include your primary Cardiologist (physician) and Advanced Practice Providers (APPs -  Physician Assistants and Nurse Practitioners) who all work together to provide you with the care you need, when you need it.  We recommend signing up for the patient portal called "MyChart".  Sign up information is provided on this After Visit Summary.  MyChart is used to connect with patients for Virtual Visits (Telemedicine).  Patients are able to view lab/test results, encounter notes, upcoming appointments, etc.  Non-urgent messages can be sent to your provider as well.   To learn more about what you can do with MyChart, go to [email or web address].    Your next appointment:   1 year(s)  Provider:   Dr. Audery Blazing  Other Instructions

## 2023-11-13 NOTE — Progress Notes (Signed)
 Cardiology Office Note:    Date:  11/13/2023  ID:  Frank Johnston, DOB [birth year], MRN [id number] PCP: Bertha Broad, MD  [facility] Providers Cardiologist:  Alexandria Angel, MD       Patient Profile:      Frank Johnston is a 83 y.o. male with visit-pertinent history of pulmonary embolus, hypertension, CKD stage 3a  Nuclear study February 2017 showed EF 65% and fixed defect but no ischemia.  Echocardiogram at that time showed normal LV function, grade 1 DD, mild MR.  CPX completed May 2017 to evaluate dyspnea previously and this is felt to be due to a combination of diastolic dysfunction, obesity, deconditioning.  Last seen in clinic on 05/05/2022 where he was doing well at the time.  Blood pressure was mildly elevated but he falls closely at home and is typically controlled.  Aorta vascular ultrasound was completed 06/03/2022 showing no evidence of AAA.      History of Present Illness:  Discussed the use of AI scribe software for clinical note transcription with the patient, who gave verbal consent to proceed.  Frank Johnston is a 83 y.o. male who returns for overdue 1 year follow-up and preoperative clearance.  He is scheduled for right total hip arthroplasty with Dr. Dante Dyer over [facility] on 12/06/2023.  He arrives into clinic by himself today.  He reports no cardiovascular problems, issues, or concerns over the past year and a half.  He denies any exertional angina, chest pains, shortness of breath, orthopnea, PND or leg swelling. He has been active, doing yard work, exercising, and has lost weight by eating less at each meal. His blood pressure was slightly high during the visit, which was attributed to white coat syndrome however he has not taken his blood pressure consistently at home to have an average.     Review of Systems  Constitutional: Negative for weight gain and weight loss.  Cardiovascular:  Negative for chest pain, claudication, dyspnea on exertion,  irregular heartbeat, leg swelling, near-syncope, orthopnea, palpitations, paroxysmal nocturnal dyspnea and syncope.  Respiratory:  Negative for cough, hemoptysis and shortness of breath.   Gastrointestinal:  Negative for abdominal pain, hematochezia and melena.  Genitourinary:  Negative for hematuria.  Neurological:  Negative for dizziness and light-headedness.     See HPI     Home Medications:    Prior to Admission medications   Medication Sig Start Date End Date Taking? Authorizing Provider  acetaminophen  (TYLENOL ) 500 MG tablet Take 1,000 mg by mouth daily as needed for moderate pain or headache.    [provider]  aspirin  81 MG tablet Take 81 mg by mouth every evening.     [provider]  fluticasone  (FLONASE ) 50 MCG/ACT nasal spray Place 2 sprays into the nose at bedtime as needed for allergies.     [provider]  hydrALAZINE  (APRESOLINE ) 50 MG tablet Take 1 tablet (50 mg total) by mouth 2 (two) times daily. May also take 1 tablet (50 mg total) daily as needed (for systolic blood pressure above 409). 02/10/20   Maudine Sos, MD  HYDROcodone -acetaminophen  (NORCO/VICODIN) 5-325 MG tablet  10/29/19   [provider]  methocarbamol  (ROBAXIN ) 500 MG tablet Take 1 tablet (500 mg total) by mouth every 6 (six) hours as needed. 05/24/22   Bronson Canny, PA-C  metoCLOPramide  (REGLAN ) 5 MG tablet Take 5 mg by mouth at bedtime.     [provider]  naproxen  sodium (ALEVE ) 220 MG tablet Take 220  mg by mouth daily as needed.    [provider]  omeprazole (PRILOSEC) 40 MG capsule Take 40 mg by mouth every evening.    [provider]  Probiotic Product (PROBIOTIC ADVANCED PO) Take 1 capsule by mouth at bedtime.     [provider]  SENNA-S 8.6-50 MG tablet TK 1 T PO BID PRN 09/03/19   [provider]  therapeutic multivitamin-minerals (THERAGRAN-M) tablet Take 1 tablet by mouth daily.    [provider]   valsartan -hydrochlorothiazide  (DIOVAN -HCT) 320-12.5 MG tablet Take 1 tablet by mouth daily. 08/11/20   [provider]   Studies Reviewed:       Cardiopulmonary stress test 02/11/2016 Exercise testing with gas exchange demonstrates  normal functional capacity when compared to matched sedentary  norms. There were no cardiovascular limitations.   Echocardiogram 11/16/2015 Study Conclusions   - Left ventricle: The cavity size was normal. Wall thickness was    increased in a pattern of mild LVH. Systolic function was normal.    The estimated ejection fraction was in the range of 55% to 60%.    Wall motion was normal; there were no regional wall motion    abnormalities. Doppler parameters are consistent with abnormal    left ventricular relaxation (grade 1 diastolic dysfunction).  - Mitral valve: There was mild regurgitation.   Lexiscan  Myoview  11/16/2015 Nuclear stress EF: 65%. There was no ST segment deviation noted during stress. This is a low risk study. The left ventricular ejection fraction is normal (55-65%). There is a medium sized, mild intensity, fixed defect involving the apical anterior, apical lateral and apical walls. There is a small in size, mild in severity, fixed defect in the basal inferoseptal location that is non reversible. No ischemia noted.   Risk Assessment/Calculations:             Physical Exam:   VS:  BP 136/70 (BP Location: Right Arm, Patient Position: Sitting, Cuff Size: Normal)   Pulse 67   Ht 5\' 9"  (1.753 m)   Wt 171 lb (77.6 kg)   SpO2 98%   BMI 25.25 kg/m    Wt Readings from Last 3 Encounters:  11/13/23 171 lb (77.6 kg)  05/05/22 180 lb 3.2 oz (81.7 kg)  12/15/20 193 lb (87.5 kg)    Constitutional:      Appearance: Normal and healthy appearance. Not in distress.  Neck:     Vascular: JVD normal.  Pulmonary:     Effort: Pulmonary effort is normal.     Breath sounds: Normal breath sounds.  Chest:     Chest wall: Not tender to  palpatation.  Cardiovascular:     PMI at left midclavicular line. Normal rate. Regular rhythm. Normal S1. Normal S2.      Murmurs: There is no murmur.     No gallop.  No click. No rub.  Pulses:    Intact distal pulses.  Edema:    Peripheral edema absent.  Musculoskeletal: Normal range of motion.     Cervical back: Normal range of motion and neck supple. Skin:    General: Skin is warm and dry.  Neurological:     General: No focal deficit present.     Mental Status: Alert, oriented to person, place, and time and oriented to person, place and time.  Psychiatric:        Mood and Affect: Mood and affect normal.        Behavior: Behavior is cooperative.  Thought Content: Thought content normal.        Assessment and Plan:  Preoperative cardiovascular exam According to the Revised Cardiac Risk Index (RCRI), his Perioperative Risk of Major Cardiac Event is (%): 0.4. His Functional Capacity in METs is: 7.34 according to the Duke Activity Status Index (DASI). Therefore, based on ACC/AHA guidelines, patient would be at acceptable risk for the planned procedure without further cardiovascular testing.   He may hold Aspirin  for 5-7 days days prior to procedure. Please resume Aspirin  as soon as possible postprocedure, at the discretion of the surgeon.    I will route this recommendation to the requesting party via Epic fax function.   Hypertension  Blood pressure today 136/70, slightly above goal of less than 130/80. He does not consistently take blood pressure at home to have average BP, he does endorse whitecoat hypertension Will have him him take blood pressure once daily over the next 2 weeks and send in blood pressure readings over MyChart If blood pressure still elevated can consider increasing HCTZ -Continue hydralazine  50 mg twice daily, valsartan -hydrochlorothiazide  320-12.5 mg  General Health Maintenance EKG today normal sinus rhythm heart rate 67bpm with first-degree block  with occasional PVC Maintain heart healthy dieting with DASH diet or Mediterranean diet Encouraged 1 and 50 minutes of moderate intensity aerobic exercise daily            Dispo:  Return in about 1 year (around 11/12/2024).  Signed, Ava Boatman, NP

## 2023-11-14 DIAGNOSIS — M542 Cervicalgia: Secondary | ICD-10-CM | POA: Diagnosis not present

## 2023-11-14 NOTE — H&P (Addendum)
 TOTAL HIP ADMISSION H&P  Patient is admitted for right total hip arthroplasty.  Subjective:  Chief Complaint: Right hip pain  HPI: Frank Johnston, 83 y.o. male, has a history of pain and functional disability in the right hip due to arthritis and patient has failed non-surgical conservative treatments for greater than 12 weeks to include NSAID's and/or analgesics, corticosteriod injections, and activity modification. Onset of symptoms was gradual, starting  several  years ago with gradually worsening course since that time. The patient noted no past surgery on the right hip. Patient currently rates pain in the right hip at 8 out of 10 with activity. Patient has night pain, worsening of pain with activity and weight bearing, and trendelenberg gait. Patient has evidence of  bone-on-bone arthritis in that hip with subchondral cystic formation and large marginal osteophytes  by imaging studies. This condition presents safety issues increasing the risk of falls. There is no current active infection.  Patient Active Problem List   Diagnosis Date Noted   Arthritis of left hand 10/12/2022   Surgery follow-up examination 01/18/2021   Pain due to onychomycosis of toenails of both feet 04/04/2019   History of adenomatous polyp of colon 01/08/2018   Preretinal fibrosis, right eye 09/06/2016   Dyspnea 02/04/2016   Chest pain 10/02/2015   DVT (deep venous thrombosis) (HCC) 03/02/2014   Pulmonary embolism on left (HCC) 02/28/2014   Knee pain, right 02/28/2014   Pulmonary embolism (HCC) 02/28/2014   Essential hypertension 01/19/2008   DYSPEPSIA 01/19/2008   ILEUS 01/19/2008   RENAL CALCULUS 01/19/2008   ARTHRITIS 01/19/2008   COLONIC POLYPS 08/01/2005   INTERNAL HEMORRHOIDS 08/01/2005    Past Medical History:  Diagnosis Date   Arthritis    Chest pain 10/02/2015   Chronic kidney disease    hx kidney stones   GERD (gastroesophageal reflux disease)    H/O hiatal hernia    Hypertension     Wears glasses     Past Surgical History:  Procedure Laterality Date   25 GAUGE PARS PLANA VITRECTOMY WITH 20 GAUGE MVR PORT Right 09/06/2016   Procedure: 25 GAUGE PARS PLANA VITRECTOMY WITH 20 GAUGE MVR PORT;  Surgeon: Sherrie George, MD;  Location: [facility] OR;  Service: Ophthalmology;  Laterality: Right;   APPENDECTOMY  1976   BACK SURGERY  1984   lumbar lam   CARPAL TUNNEL RELEASE  12/06/2011   Procedure: CARPAL TUNNEL RELEASE;  Surgeon: Nicki Reaper, MD;  Location: [facility];  Service: Orthopedics;  Laterality: Right;   CARPAL TUNNEL RELEASE  08/07/2012   Procedure: CARPAL TUNNEL RELEASE;  Surgeon: Nicki Reaper, MD;  Location: [facility];  Service: Orthopedics;  Laterality: Left;  carpal tunnel release   CHOLECYSTECTOMY  1976   COLONOSCOPY     JOINT REPLACEMENT  4/12   lt total knee   KNEE ARTHROSCOPY     rt and lt   KNEE CLOSED REDUCTION  6/12   lt -after lt total knee   TRIGGER FINGER RELEASE  12/06/2011   Procedure: RELEASE TRIGGER FINGER/A-1 PULLEY;  Surgeon: Nicki Reaper, MD;  Location: [facility];  Service: Orthopedics;  Laterality: Right;  right thumb, middle, and ring fingers   TRIGGER FINGER RELEASE  08/07/2012   Procedure: RELEASE TRIGGER FINGER/A-1 PULLEY;  Surgeon: Nicki Reaper, MD;  Location: [facility];  Service: Orthopedics;  Laterality: Left;  trigger release left ring finger    Prior to Admission medications  Medication Sig Start Date End Date Taking? Authorizing Provider  acetaminophen (TYLENOL) 500 MG tablet Take 1,000 mg by mouth daily as needed for moderate pain or headache.    [provider]  aspirin 81 MG tablet Take 81 mg by mouth every evening.     [provider]  fluticasone (FLONASE) 50 MCG/ACT nasal spray Place 2 sprays into the nose at bedtime as needed for allergies.     [provider]  hydrALAZINE (APRESOLINE) 50 MG tablet Take 1 tablet (50 mg total) by mouth 2 (two)  times daily. May also take 1 tablet (50 mg total) daily as needed (for systolic blood pressure above 098). 02/10/20   Chilton Si, MD  HYDROcodone-acetaminophen (NORCO/VICODIN) 5-325 MG tablet  10/29/19   [provider]  methocarbamol (ROBAXIN) 500 MG tablet Take 1 tablet (500 mg total) by mouth every 6 (six) hours as needed. 05/24/22   Kirtland Bouchard, PA-C  metoCLOPramide (REGLAN) 5 MG tablet Take 5 mg by mouth at bedtime.     [provider]  naproxen sodium (ALEVE) 220 MG tablet Take 220 mg by mouth daily as needed.    [provider]  omeprazole (PRILOSEC) 40 MG capsule Take 40 mg by mouth every evening.    [provider]  Probiotic Product (PROBIOTIC ADVANCED PO) Take 1 capsule by mouth at bedtime.     [provider]  SENNA-S 8.6-50 MG tablet TK 1 T PO BID PRN 09/03/19   [provider]  therapeutic multivitamin-minerals (THERAGRAN-M) tablet Take 1 tablet by mouth daily.    [provider]  valsartan-hydrochlorothiazide (DIOVAN-HCT) 320-12.5 MG tablet Take 1 tablet by mouth daily. 08/11/20   [provider]    Allergies  Allergen Reactions   Amlodipine Swelling   No Known Allergies     Social History   Socioeconomic History   Marital status: Married    Spouse name: Not on file   Number of children: Not on file   Years of education: Not on file   Highest education level: Not on file  Occupational History   Occupation: retired  Tobacco Use   Smoking status: Former    Current packs/day: 0.00    Average packs/day: 1 pack/day for 40.0 years (40.0 ttl pk-yrs)    Types: Cigarettes    Start date: 11/30/1954    Quit date: 11/30/1994    Years since quitting: 28.9   Smokeless tobacco: Never  Vaping Use   Vaping status: Never Used  Substance and Sexual Activity   Alcohol use: No    Alcohol/week: 0.0 standard drinks of alcohol   Drug use: No   Sexual activity: Not on file  Other Topics Concern   Not on  file  Social History Narrative   Epworth Sleepiness Scale = 7 (as of 10/02/2015)   Social Drivers of Corporate investment banker Strain: Not on file  Food Insecurity: Not on file  Transportation Needs: Not on file  Physical Activity: Not on file  Stress: Not on file  Social Connections: Not on file  Intimate Partner Violence: Not on file    Tobacco Use: Medium Risk (11/13/2023)   Patient History    Smoking Tobacco Use: Former    Smokeless Tobacco Use: Never    Passive Exposure: Not on file   Social History   Substance and Sexual Activity  Alcohol Use No   Alcohol/week: 0.0 standard drinks of alcohol    Family History  Problem Relation Age of Onset  Stroke Mother    Cancer Father    Hypertension Child    Hypertension Child     Review of Systems  Constitutional:  Negative for chills and fever.  HENT: Negative.    Eyes: Negative.   Respiratory:  Negative for cough and shortness of breath.   Cardiovascular:  Negative for chest pain and palpitations.  Gastrointestinal:  Negative for abdominal pain, diarrhea, nausea and vomiting.  Genitourinary:  Negative for dysuria, frequency and urgency.  Musculoskeletal:  Positive for joint pain.  Skin:  Negative for rash.   Objective:  Physical Exam: Well nourished and well developed.  General: Alert and oriented x3, cooperative and pleasant, no acute distress.  Head: normocephalic, atraumatic, neck supple.  Eyes: EOMI. Abdomen: non-tender to palpation and soft, normoactive bowel sounds. Musculoskeletal: - Right hip can flex to 100 degrees with minimal internal rotation, approximately 20 degrees of external rotation, and 20 degrees of abduction. He does have an antalgic gait pattern on the right. Calves soft and nontender. Motor function intact in LE. Strength 5/5 LE bilaterally. Neuro: Distal pulses 2+. Sensation to light touch intact in LE.  Vital signs in last 24 hours: BP: ()/()  Arterial Line BP: ()/()   Imaging  Review Plain radiographs demonstrate severe degenerative joint disease of the right hip. The bone quality appears to be adequate for age and reported activity level.  Assessment/Plan:  End stage arthritis, right hip  The patient history, physical examination, clinical judgement of the provider and imaging studies are consistent with end stage degenerative joint disease of the right hip and total hip arthroplasty is deemed medically necessary. The treatment options including medical management, injection therapy, arthroscopy and arthroplasty were discussed at length. The risks and benefits of total hip arthroplasty were presented and reviewed. The risks due to aseptic loosening, infection, stiffness, dislocation/subluxation, thromboembolic complications and other imponderables were discussed. The patient acknowledged the explanation, agreed to proceed with the plan and consent was signed. Patient is being admitted for inpatient treatment for surgery, pain control, PT, OT, prophylactic antibiotics, VTE prophylaxis, progressive ambulation and ADLs and discharge planning.The patient is planning to be discharged  home .  Therapy Plans: HEP Disposition: Home with Wife Planned DVT Prophylaxis: Aspirin 81 mg BID DME Needed: RW PCP: Jarome Matin, MD (clearance received) Cardiologist: Eloise Harman, MD (clearance received) TXA: IV Allergies: NKDA Anesthesia Concerns: difficulty awakening, hx back surgery for ruptured discs BMI: 25.5 Last HgbA1c: not diabetic  Pharmacy: [facility] (deliver to room)  Other: -Taking hydrocodone 5 mg usually QD but written for 1-2 tabs q 6 hrs  - Patient was instructed on what medications to stop prior to surgery. - Follow-up visit in 2 weeks with Dr. Lequita Halt - Begin physical therapy following surgery - Pre-operative lab work as pre-surgical testing - Prescriptions will be provided in hospital at time of discharge  R. Arcola Jansky, PA-C Orthopedic  Surgery [facility] [place]

## 2023-11-16 DIAGNOSIS — C44319 Basal cell carcinoma of skin of other parts of face: Secondary | ICD-10-CM | POA: Diagnosis not present

## 2023-11-24 ENCOUNTER — Encounter (HOSPITAL_COMMUNITY): Admission: RE | Admit: 2023-11-24 | Payer: Medicare Other | Source: Ambulatory Visit

## 2023-11-29 NOTE — Patient Instructions (Signed)
 SURGICAL WAITING ROOM VISITATION  Patients having surgery or a procedure may have no more than 2 support people in the waiting area - these visitors may rotate.    Children under the age of 83 must have an adult with them who is not the patient.  Due to an increase in RSV and influenza rates and associated hospitalizations, children ages 34 and under may not visit patients in [facility] hospitals.  Visitors with respiratory illnesses are discouraged from visiting and should remain at home.  If the patient needs to stay at the hospital during part of their recovery, the visitor guidelines for inpatient rooms apply. Pre-op nurse will coordinate an appropriate time for 1 support person to accompany patient in pre-op.  This support person may not rotate.    Please refer to the [facility] website for the visitor guidelines for Inpatients (after your surgery is over and you are in a regular room).    Your procedure is scheduled on: 12/06/23   Report to [facility] Main Entrance    Report to admitting at 9:45 AM   Call this number if you have problems the morning of surgery [phone]   Do not eat food :After Midnight.   After Midnight you may have the following liquids until 9:15 AM DAY OF SURGERY  Water Non-Citrus Juices (without pulp, NO RED-Apple, White grape, White cranberry) Black Coffee (NO MILK/CREAM OR CREAMERS, sugar ok)  Clear Tea (NO MILK/CREAM OR CREAMERS, sugar ok) regular and decaf                             Plain Jell-O (NO RED)                                           Fruit ices (not with fruit pulp, NO RED)                                     Popsicles (NO RED)                                                               Sports drinks like Gatorade (NO RED)                  The day of surgery:  Drink ONE (1) Pre-Surgery Clear Ensure at 9:15 AM the morning of surgery. Drink in one sitting. Do not sip.  This drink was given to you during your hospital   pre-op appointment visit. Nothing else to drink after completing the  Pre-Surgery Clear Ensure.          If you have questions, please contact your surgeon's office.   FOLLOW BOWEL PREP AND ANY ADDITIONAL PRE OP INSTRUCTIONS YOU RECEIVED FROM YOUR SURGEON'S OFFICE!!!     Oral Hygiene is also important to reduce your risk of infection.                                    Remember -  BRUSH YOUR TEETH THE MORNING OF SURGERY WITH YOUR REGULAR TOOTHPASTE  DENTURES WILL BE REMOVED PRIOR TO SURGERY PLEASE DO NOT APPLY "Poly grip" OR ADHESIVES!!!   Stop all vitamins and herbal supplements 7 days before surgery.   Take these medicines the morning of surgery with A SIP OF WATER: Tylenol, Hydralazine, Norco                              You may not have any metal on your body including jewelry, and body piercing             Do not wear lotions, powders, cologne, or deodorant              Men may shave face and neck.   Do not bring valuables to the hospital. [facility] IS NOT             RESPONSIBLE   FOR VALUABLES.   Contacts, glasses, dentures or bridgework may not be worn into surgery.   Bring small overnight bag day of surgery.   DO NOT BRING YOUR HOME MEDICATIONS TO THE HOSPITAL. PHARMACY WILL DISPENSE MEDICATIONS LISTED ON YOUR MEDICATION LIST TO YOU DURING YOUR ADMISSION IN THE HOSPITAL!              Please read over the following fact sheets you were given: IF YOU HAVE QUESTIONS ABOUT YOUR PRE-OP INSTRUCTIONS PLEASE CALL [name]    If you received a COVID test during your pre-op visit  it is requested that you wear a mask when out in public, stay away from anyone that may not be feeling well and notify your surgeon if you develop symptoms. If you test positive for Covid or have been in contact with anyone that has tested positive in the last 10 days please notify you surgeon.      Pre-operative 5 CHG Bath Instructions   You can play a key role in reducing the risk  of infection after surgery. Your skin needs to be as free of germs as possible. You can reduce the number of germs on your skin by washing with CHG (chlorhexidine gluconate) soap before surgery. CHG is an antiseptic soap that kills germs and continues to kill germs even after washing.   DO NOT use if you have an allergy to chlorhexidine/CHG or antibacterial soaps. If your skin becomes reddened or irritated, stop using the CHG and notify one of our RNs at [phone].   Please shower with the CHG soap starting 4 days before surgery using the following schedule:     Please keep in mind the following:  DO NOT shave, including legs and underarms, starting the day of your first shower.   You may shave your face at any point before/day of surgery.  Place clean sheets on your bed the day you start using CHG soap. Use a clean washcloth (not used since being washed) for each shower. DO NOT sleep with pets once you start using the CHG.   CHG Shower Instructions:  If you choose to wash your hair and private area, wash first with your normal shampoo/soap.  After you use shampoo/soap, rinse your hair and body thoroughly to remove shampoo/soap residue.  Turn the water OFF and apply about 3 tablespoons (45 ml) of CHG soap to a CLEAN washcloth.  Apply CHG soap ONLY FROM YOUR NECK DOWN TO YOUR TOES (washing for 3-5 minutes)  DO NOT use CHG soap  on face, private areas, open wounds, or sores.  Pay special attention to the area where your surgery is being performed.  If you are having back surgery, having someone wash your back for you may be helpful. Wait 2 minutes after CHG soap is applied, then you may rinse off the CHG soap.  Pat dry with a clean towel  Put on clean clothes/pajamas   If you choose to wear lotion, please use ONLY the CHG-compatible lotions on the back of this paper.     Additional instructions for the day of surgery: DO NOT APPLY any lotions, deodorants, cologne, or perfumes.   Put on  clean/comfortable clothes.  Brush your teeth.  Ask your nurse before applying any prescription medications to the skin.      CHG Compatible Lotions   Aveeno Moisturizing lotion  Cetaphil Moisturizing Cream  Cetaphil Moisturizing Lotion  Clairol Herbal Essence Moisturizing Lotion, Dry Skin  Clairol Herbal Essence Moisturizing Lotion, Extra Dry Skin  Clairol Herbal Essence Moisturizing Lotion, Normal Skin  Curel Age Defying Therapeutic Moisturizing Lotion with Alpha Hydroxy  Curel Extreme Care Body Lotion  Curel Soothing Hands Moisturizing Hand Lotion  Curel Therapeutic Moisturizing Cream, Fragrance-Free  Curel Therapeutic Moisturizing Lotion, Fragrance-Free  Curel Therapeutic Moisturizing Lotion, Original Formula  Eucerin Daily Replenishing Lotion  Eucerin Dry Skin Therapy Plus Alpha Hydroxy Crme  Eucerin Dry Skin Therapy Plus Alpha Hydroxy Lotion  Eucerin Original Crme  Eucerin Original Lotion  Eucerin Plus Crme Eucerin Plus Lotion  Eucerin TriLipid Replenishing Lotion  Keri Anti-Bacterial Hand Lotion  Keri Deep Conditioning Original Lotion Dry Skin Formula Softly Scented  Keri Deep Conditioning Original Lotion, Fragrance Free Sensitive Skin Formula  Keri Lotion Fast Absorbing Fragrance Free Sensitive Skin Formula  Keri Lotion Fast Absorbing Softly Scented Dry Skin Formula  Keri Original Lotion  Keri Skin Renewal Lotion Keri Silky Smooth Lotion  Keri Silky Smooth Sensitive Skin Lotion  Nivea Body Creamy Conditioning Oil  Nivea Body Extra Enriched Lotion  Nivea Body Original Lotion  Nivea Body Sheer Moisturizing Lotion Nivea Crme  Nivea Skin Firming Lotion  NutraDerm 30 Skin Lotion  NutraDerm Skin Lotion  NutraDerm Therapeutic Skin Cream  NutraDerm Therapeutic Skin Lotion  ProShield Protective Hand Cream  Provon moisturizing lotion View Pre-Surgery Education Videos:  [email or web address]     Incentive  Spirometer  An incentive spirometer is a tool that can help keep your lungs clear and active. This tool measures how well you are filling your lungs with each breath. Taking long deep breaths may help reverse or decrease the chance of developing breathing (pulmonary) problems (especially infection) following: A long period of time when you are unable to move or be active. BEFORE THE PROCEDURE  If the spirometer includes an indicator to show your best effort, your nurse or respiratory therapist will set it to a desired goal. If possible, sit up straight or lean slightly forward. Try not to slouch. Hold the incentive spirometer in an upright position. INSTRUCTIONS FOR USE  Sit on the edge of your bed if possible, or sit up as far as you can in bed or on a chair. Hold the incentive spirometer in an upright position. Breathe out normally. Place the mouthpiece in your mouth and seal your lips tightly around it. Breathe in slowly and as deeply as possible, raising the piston or the ball toward the top of the column. Hold your breath for 3-5 seconds or for as long as possible. Allow the piston or ball to fall to the  bottom of the column. Remove the mouthpiece from your mouth and breathe out normally. Rest for a few seconds and repeat Steps 1 through 7 at least 10 times every 1-2 hours when you are awake. Take your time and take a few normal breaths between deep breaths. The spirometer may include an indicator to show your best effort. Use the indicator as a goal to work toward during each repetition. After each set of 10 deep breaths, practice coughing to be sure your lungs are clear. If you have an incision (the cut made at the time of surgery), support your incision when coughing by placing a pillow or rolled up towels firmly against it. Once you are able to get out of bed, walk around indoors and cough well. You may stop using the incentive spirometer when instructed by your caregiver.  RISKS AND  COMPLICATIONS Take your time so you do not get dizzy or light-headed. If you are in pain, you may need to take or ask for pain medication before doing incentive spirometry. It is harder to take a deep breath if you are having pain. AFTER USE Rest and breathe slowly and easily. It can be helpful to keep track of a log of your progress. Your caregiver can provide you with a simple table to help with this. If you are using the spirometer at home, follow these instructions: SEEK MEDICAL CARE IF:  You are having difficultly using the spirometer. You have trouble using the spirometer as often as instructed. Your pain medication is not giving enough relief while using the spirometer. You develop fever of 100.5 F (38.1 C) or higher. SEEK IMMEDIATE MEDICAL CARE IF:  You cough up bloody sputum that had not been present before. You develop fever of 102 F (38.9 C) or greater. You develop worsening pain at or near the incision site. MAKE SURE YOU:  Understand these instructions. Will watch your condition. Will get help right away if you are not doing well or get worse. Document Released: 01/30/2007 Document Revised: 12/12/2011 Document Reviewed: 04/02/2007 ExitCare Patient Information 2014 Exit[employer], [place].   ________________________________________________________________________ WHAT IS A BLOOD TRANSFUSION? Blood Transfusion Information  A transfusion is the replacement of blood or some of its parts. Blood is made up of multiple cells which provide different functions. Red blood cells carry oxygen and are used for blood loss replacement. White blood cells fight against infection. Platelets control bleeding. Plasma helps clot blood. Other blood products are available for specialized needs, such as hemophilia or other clotting disorders. BEFORE THE TRANSFUSION  Who gives blood for transfusions?  Healthy volunteers who are fully evaluated to make sure their blood is safe. This is blood bank  blood. Transfusion therapy is the safest it has ever been in the practice of medicine. Before blood is taken from a donor, a complete history is taken to make sure that person has no history of diseases nor engages in risky social behavior (examples are intravenous drug use or sexual activity with multiple partners). The donor's travel history is screened to minimize risk of transmitting infections, such as malaria. The donated blood is tested for signs of infectious diseases, such as HIV and hepatitis. The blood is then tested to be sure it is compatible with you in order to minimize the chance of a transfusion reaction. If you or a relative donates blood, this is often done in anticipation of surgery and is not appropriate for emergency situations. It takes many days to process the donated blood. RISKS AND  COMPLICATIONS Although transfusion therapy is very safe and saves many lives, the main dangers of transfusion include:  Getting an infectious disease. Developing a transfusion reaction. This is an allergic reaction to something in the blood you were given. Every precaution is taken to prevent this. The decision to have a blood transfusion has been considered carefully by your caregiver before blood is given. Blood is not given unless the benefits outweigh the risks. AFTER THE TRANSFUSION Right after receiving a blood transfusion, you will usually feel much better and more energetic. This is especially true if your red blood cells have gotten low (anemic). The transfusion raises the level of the red blood cells which carry oxygen, and this usually causes an energy increase. The nurse administering the transfusion will monitor you carefully for complications. HOME CARE INSTRUCTIONS  No special instructions are needed after a transfusion. You may find your energy is better. Speak with your caregiver about any limitations on activity for underlying diseases you may have. SEEK MEDICAL CARE IF:  Your  condition is not improving after your transfusion. You develop redness or irritation at the intravenous (IV) site. SEEK IMMEDIATE MEDICAL CARE IF:  Any of the following symptoms occur over the next 12 hours: Shaking chills. You have a temperature by mouth above 102 F (38.9 C), not controlled by medicine. Chest, back, or muscle pain. People around you feel you are not acting correctly or are confused. Shortness of breath or difficulty breathing. Dizziness and fainting. You get a rash or develop hives. You have a decrease in urine output. Your urine turns a dark color or changes to pink, red, or brown. Any of the following symptoms occur over the next 10 days: You have a temperature by mouth above 102 F (38.9 C), not controlled by medicine. Shortness of breath. Weakness after normal activity. The white part of the eye turns yellow (jaundice). You have a decrease in the amount of urine or are urinating less often. Your urine turns a dark color or changes to pink, red, or brown. Document Released: 09/16/2000 Document Revised: 12/12/2011 Document Reviewed: 05/05/2008 [facility] Patient Information 2014 [place], [place].  _______________________________________________________________________

## 2023-11-29 NOTE — Progress Notes (Signed)
 COVID Vaccine Completed: yes  Date of COVID positive in last 90 days:  PCP - Jarome Matin, MD Cardiologist - Olga Millers, MD  Cardiac clearance in media tab by Rise Paganini, NP  Chest x-ray -  EKG - 11/13/23 Epic Stress Test - 11/16/15 Epic ECHO - 11/17/15 Epic Cardiac Cath -  Pacemaker/ICD device last checked: Spinal Cord Stimulator:  Bowel Prep -   Sleep Study -  CPAP -   Fasting Blood Sugar -  Checks Blood Sugar _____ times a day  Last dose of GLP1 agonist-  N/A GLP1 instructions:  Hold 7 days before surgery    Last dose of SGLT-2 inhibitors-  N/A SGLT-2 instructions:  Hold 3 days before surgery    Blood Thinner Instructions:  Last dose:   Time: Aspirin Instructions: ASA 81, hold 5-7 days Last Dose:  Activity level:  Can go up a flight of stairs and perform activities of daily living without stopping and without symptoms of chest pain or shortness of breath.  Able to exercise without symptoms  Unable to go up a flight of stairs without symptoms of     Anesthesia review: HTN, DVT, PE,  Patient denies shortness of breath, fever, cough and chest pain at PAT appointment  Patient verbalized understanding of instructions that were given to them at the PAT appointment. Patient was also instructed that they will need to review over the PAT instructions again at home before surgery.

## 2023-11-30 ENCOUNTER — Encounter (HOSPITAL_COMMUNITY)
Admission: RE | Admit: 2023-11-30 | Discharge: 2023-11-30 | Disposition: A | Discharge status: Home / Self Care | Payer: Medicare Other | Source: Ambulatory Visit | Attending: Orthopedic Surgery | Admitting: Orthopedic Surgery

## 2023-11-30 ENCOUNTER — Encounter (HOSPITAL_COMMUNITY): Payer: Self-pay

## 2023-11-30 ENCOUNTER — Other Ambulatory Visit: Payer: Self-pay

## 2023-11-30 VITALS — BP 147/76 | HR 65 | Temp 97.4°F | Resp 16 | Ht 69.0 in | Wt 171.0 lb

## 2023-11-30 DIAGNOSIS — I129 Hypertensive chronic kidney disease with stage 1 through stage 4 chronic kidney disease, or unspecified chronic kidney disease: Secondary | ICD-10-CM | POA: Diagnosis not present

## 2023-11-30 DIAGNOSIS — N183 Chronic kidney disease, stage 3 unspecified: Secondary | ICD-10-CM | POA: Diagnosis not present

## 2023-11-30 DIAGNOSIS — Z01812 Encounter for preprocedural laboratory examination: Secondary | ICD-10-CM | POA: Diagnosis not present

## 2023-11-30 DIAGNOSIS — M1611 Unilateral primary osteoarthritis, right hip: Secondary | ICD-10-CM | POA: Diagnosis not present

## 2023-11-30 DIAGNOSIS — I10 Essential (primary) hypertension: Secondary | ICD-10-CM

## 2023-11-30 DIAGNOSIS — Z87891 Personal history of nicotine dependence: Secondary | ICD-10-CM | POA: Diagnosis not present

## 2023-11-30 DIAGNOSIS — Z01818 Encounter for other preprocedural examination: Secondary | ICD-10-CM

## 2023-11-30 HISTORY — DX: Pneumonia, unspecified organism: J18.9

## 2023-11-30 HISTORY — DX: Malignant (primary) neoplasm, unspecified: C80.1

## 2023-11-30 LAB — CBC
HCT: 44.7 % (ref 39.0–52.0)
Hemoglobin: 15 g/dL (ref 13.0–17.0)
MCH: 29.5 pg (ref 26.0–34.0)
MCHC: 33.6 g/dL (ref 30.0–36.0)
MCV: 88 fL (ref 80.0–100.0)
Platelets: 256 10*3/uL (ref 150–400)
RBC: 5.08 MIL/uL (ref 4.22–5.81)
RDW: 12.9 % (ref 11.5–15.5)
WBC: 9.6 10*3/uL (ref 4.0–10.5)
nRBC: 0 % (ref 0.0–0.2)

## 2023-11-30 LAB — BASIC METABOLIC PANEL
Anion gap: 10 (ref 5–15)
BUN: 31 mg/dL — ABNORMAL HIGH (ref 8–23)
CO2: 25 mmol/L (ref 22–32)
Calcium: 10 mg/dL (ref 8.9–10.3)
Chloride: 102 mmol/L (ref 98–111)
Creatinine, Ser: 1.24 mg/dL (ref 0.61–1.24)
GFR, Estimated: 58 mL/min — ABNORMAL LOW (ref >60)
Glucose, Bld: 78 mg/dL (ref 70–99)
Potassium: 3.9 mmol/L (ref 3.5–5.1)
Sodium: 137 mmol/L (ref 135–145)

## 2023-11-30 LAB — SURGICAL PCR SCREEN
MRSA, PCR: NEGATIVE
Staphylococcus aureus: POSITIVE — AB

## 2023-12-01 NOTE — Progress Notes (Signed)
 [facility] results routed to Dr. Lequita Halt

## 2023-12-01 NOTE — Progress Notes (Signed)
 Anesthesia Chart Review   Case: [id number] Date/Time: 12/06/23 1200   Procedure: TOTAL HIP ARTHROPLASTY ANTERIOR APPROACH (Right: Hip)   Anesthesia type: Choice   Pre-op diagnosis: right hip osteoarthritis   [facility] ROOM 09 / [facility]   Surgeons: Ollen Gross, MD       DISCUSSION:82 y.o. former smoker with h/o HTN, PE, CKD Stage III, right hip OA scheduled for above procedure 12/06/2023 with Dr. Ollen Gross.   Per cardiology preoperative evaluation 11/13/2023, "According to the Revised Cardiac Risk Index (RCRI), his Perioperative Risk of Major Cardiac Event is (%): 0.4. His Functional Capacity in METs is: 7.34 according to the Duke Activity Status Index (DASI). Therefore, based on ACC/AHA guidelines, patient would be at acceptable risk for the planned procedure without further cardiovascular testing.    He may hold Aspirin for 5-7 days days prior to procedure. Please resume Aspirin as soon as possible postprocedure, at the discretion of the surgeon. "  VS: BP (!) 147/76   Pulse 65   Temp (!) 36.3 C (Oral)   Resp 16   Ht 5\' 9"  (1.753 m)   Wt 77.6 kg   SpO2 97%   BMI 25.25 kg/m   PROVIDERS: Garlan Fillers, MD is PCP   Olga Millers, MD  is Cardiologist  LABS: Labs reviewed: Acceptable for surgery. (all labs ordered are listed, but only abnormal results are displayed)  Labs Reviewed  SURGICAL PCR SCREEN - Abnormal; Notable for the following components:      Result Value   Staphylococcus aureus POSITIVE (*)    All other components within normal limits  BASIC METABOLIC PANEL - Abnormal; Notable for the following components:   BUN 31 (*)    GFR, Estimated 58 (*)    All other components within normal limits  CBC  TYPE AND SCREEN     IMAGES:   EKG:   CV: Echo 11/16/2015 - Left ventricle: The cavity size was normal. Wall thickness was    increased in a pattern of mild LVH. Systolic function was normal.    The estimated ejection fraction was in the range of  55% to 60%.    Wall motion was normal; there were no regional wall motion    abnormalities. Doppler parameters are consistent with abnormal    left ventricular relaxation (grade 1 diastolic dysfunction).  - Mitral valve: There was mild regurgitation.  Past Medical History:  Diagnosis Date   Arthritis    Cancer (HCC)    basal cell to forehead   Chest pain 10/02/2015   Chronic kidney disease    hx kidney stones   GERD (gastroesophageal reflux disease)    H/O hiatal hernia    Hypertension    Pneumonia    Wears glasses     Past Surgical History:  Procedure Laterality Date   25 GAUGE PARS PLANA VITRECTOMY WITH 20 GAUGE MVR PORT Right 09/06/2016   Procedure: 25 GAUGE PARS PLANA VITRECTOMY WITH 20 GAUGE MVR PORT;  Surgeon: Sherrie George, MD;  Location: [facility] OR;  Service: Ophthalmology;  Laterality: Right;   APPENDECTOMY  1976   BACK SURGERY  1984   lumbar lam   CARPAL TUNNEL RELEASE  12/06/2011   Procedure: CARPAL TUNNEL RELEASE;  Surgeon: Nicki Reaper, MD;  Location: [facility];  Service: Orthopedics;  Laterality: Right;   CARPAL TUNNEL RELEASE  08/07/2012   Procedure: CARPAL TUNNEL RELEASE;  Surgeon: Nicki Reaper, MD;  Location: [facility];  Service: Orthopedics;  Laterality: Left;  carpal tunnel release   CHOLECYSTECTOMY  1976   COLONOSCOPY     JOINT REPLACEMENT  4/12   lt total knee   KNEE ARTHROSCOPY     rt and lt   KNEE CLOSED REDUCTION  6/12   lt -after lt total knee   TRIGGER FINGER RELEASE  12/06/2011   Procedure: RELEASE TRIGGER FINGER/A-1 PULLEY;  Surgeon: Nicki Reaper, MD;  Location: [facility];  Service: Orthopedics;  Laterality: Right;  right thumb, middle, and ring fingers   TRIGGER FINGER RELEASE  08/07/2012   Procedure: RELEASE TRIGGER FINGER/A-1 PULLEY;  Surgeon: Nicki Reaper, MD;  Location: [facility];  Service: Orthopedics;  Laterality: Left;  trigger release left ring finger    MEDICATIONS:  acetaminophen  (TYLENOL) 500 MG tablet   aspirin 81 MG tablet   fluticasone (FLONASE) 50 MCG/ACT nasal spray   hydrALAZINE (APRESOLINE) 50 MG tablet   HYDROcodone-acetaminophen (NORCO/VICODIN) 5-325 MG tablet   methocarbamol (ROBAXIN) 500 MG tablet   metoCLOPramide (REGLAN) 5 MG tablet   naproxen sodium (ALEVE) 220 MG tablet   omeprazole (PRILOSEC) 40 MG capsule   Probiotic Product (PROBIOTIC ADVANCED PO)   SENNA-S 8.6-50 MG tablet   therapeutic multivitamin-minerals (THERAGRAN-M) tablet   valsartan-hydrochlorothiazide (DIOVAN-HCT) 320-12.5 MG tablet    betamethasone acetate-betamethasone sodium phosphate (CELESTONE) injection 12 mg    Jodell Cipro Ward, PA-C [facility] Pre-Surgical Testing [phone]

## 2023-12-06 ENCOUNTER — Encounter (HOSPITAL_COMMUNITY)
Admission: RE | Disposition: A | Discharge status: Home / Self Care | Payer: Self-pay | Source: Home / Self Care | Attending: Orthopedic Surgery

## 2023-12-06 ENCOUNTER — Ambulatory Visit (HOSPITAL_COMMUNITY): Payer: Self-pay | Admitting: Physician Assistant

## 2023-12-06 ENCOUNTER — Other Ambulatory Visit: Payer: Self-pay

## 2023-12-06 ENCOUNTER — Encounter (HOSPITAL_COMMUNITY): Payer: Self-pay | Admitting: Orthopedic Surgery

## 2023-12-06 ENCOUNTER — Observation Stay (HOSPITAL_COMMUNITY)

## 2023-12-06 ENCOUNTER — Ambulatory Visit (HOSPITAL_BASED_OUTPATIENT_CLINIC_OR_DEPARTMENT_OTHER): Admitting: Anesthesiology

## 2023-12-06 ENCOUNTER — Observation Stay (HOSPITAL_COMMUNITY)
Admission: RE | Admit: 2023-12-06 | Discharge: 2023-12-07 | Disposition: A | Discharge status: Home / Self Care | Payer: Medicare Other | Attending: Orthopedic Surgery | Admitting: Orthopedic Surgery

## 2023-12-06 ENCOUNTER — Ambulatory Visit (HOSPITAL_COMMUNITY)

## 2023-12-06 DIAGNOSIS — Z96653 Presence of artificial knee joint, bilateral: Secondary | ICD-10-CM | POA: Diagnosis not present

## 2023-12-06 DIAGNOSIS — Z7982 Long term (current) use of aspirin: Secondary | ICD-10-CM | POA: Insufficient documentation

## 2023-12-06 DIAGNOSIS — Z87891 Personal history of nicotine dependence: Secondary | ICD-10-CM | POA: Insufficient documentation

## 2023-12-06 DIAGNOSIS — Z86711 Personal history of pulmonary embolism: Secondary | ICD-10-CM | POA: Insufficient documentation

## 2023-12-06 DIAGNOSIS — Z85828 Personal history of other malignant neoplasm of skin: Secondary | ICD-10-CM | POA: Diagnosis not present

## 2023-12-06 DIAGNOSIS — Z86718 Personal history of other venous thrombosis and embolism: Secondary | ICD-10-CM | POA: Diagnosis not present

## 2023-12-06 DIAGNOSIS — Z79899 Other long term (current) drug therapy: Secondary | ICD-10-CM | POA: Diagnosis not present

## 2023-12-06 DIAGNOSIS — I129 Hypertensive chronic kidney disease with stage 1 through stage 4 chronic kidney disease, or unspecified chronic kidney disease: Secondary | ICD-10-CM | POA: Insufficient documentation

## 2023-12-06 DIAGNOSIS — M1611 Unilateral primary osteoarthritis, right hip: Secondary | ICD-10-CM

## 2023-12-06 DIAGNOSIS — Z96641 Presence of right artificial hip joint: Secondary | ICD-10-CM | POA: Diagnosis not present

## 2023-12-06 DIAGNOSIS — Z471 Aftercare following joint replacement surgery: Secondary | ICD-10-CM | POA: Diagnosis not present

## 2023-12-06 DIAGNOSIS — N189 Chronic kidney disease, unspecified: Secondary | ICD-10-CM | POA: Diagnosis not present

## 2023-12-06 DIAGNOSIS — M169 Osteoarthritis of hip, unspecified: Principal | ICD-10-CM | POA: Diagnosis present

## 2023-12-06 HISTORY — PX: TOTAL HIP ARTHROPLASTY: SHX124

## 2023-12-06 LAB — TYPE AND SCREEN
ABO/RH(D): A POS
Antibody Screen: NEGATIVE

## 2023-12-06 SURGERY — ARTHROPLASTY, HIP, TOTAL, ANTERIOR APPROACH
Anesthesia: Spinal | Site: Hip | Laterality: Right

## 2023-12-06 MED ORDER — ACETAMINOPHEN 325 MG PO TABS: 325.0000 mg | ORAL_TABLET | Freq: Four times a day (QID) | ORAL | Status: DC | PRN

## 2023-12-06 MED ORDER — CHLORHEXIDINE GLUCONATE 0.12 % MT SOLN
15.0000 mL | Freq: Once | OROMUCOSAL | Status: AC
  Administered 2023-12-06: 15 mL via OROMUCOSAL

## 2023-12-06 MED ORDER — BUPIVACAINE-EPINEPHRINE (PF) 0.25% -1:200000 IJ SOLN
INTRAMUSCULAR | Status: DC | PRN
  Administered 2023-12-06: 30 mL

## 2023-12-06 MED ORDER — CHLORHEXIDINE GLUCONATE 4 % EX SOLN: 1.0000 | CUTANEOUS | 1 refills | Status: DC

## 2023-12-06 MED ORDER — ONDANSETRON HCL 4 MG/2ML IJ SOLN: 4.0000 mg | Freq: Four times a day (QID) | INTRAMUSCULAR | Status: DC | PRN

## 2023-12-06 MED ORDER — OXYCODONE HCL 5 MG PO TABS
5.0000 mg | ORAL_TABLET | ORAL | Status: DC | PRN
  Administered 2023-12-06 – 2023-12-07 (×4): 5 mg via ORAL
  Filled 2023-12-06 (×3): qty 1

## 2023-12-06 MED ORDER — OXYCODONE HCL 5 MG/5ML PO SOLN: 5.0000 mg | Freq: Once | ORAL | Status: DC | PRN

## 2023-12-06 MED ORDER — DOCUSATE SODIUM 100 MG PO CAPS
100.0000 mg | ORAL_CAPSULE | Freq: Two times a day (BID) | ORAL | Status: DC
  Administered 2023-12-06 – 2023-12-07 (×2): 100 mg via ORAL
  Filled 2023-12-06 (×2): qty 1

## 2023-12-06 MED ORDER — CEFAZOLIN SODIUM-DEXTROSE 2-4 GM/100ML-% IV SOLN
2.0000 g | Freq: Four times a day (QID) | INTRAVENOUS | Status: AC
  Administered 2023-12-06 (×2): 2 g via INTRAVENOUS
  Filled 2023-12-06 (×2): qty 100

## 2023-12-06 MED ORDER — FLUTICASONE PROPIONATE 50 MCG/ACT NA SUSP
2.0000 | Freq: Every day | NASAL | Status: DC
  Filled 2023-12-06: qty 16

## 2023-12-06 MED ORDER — METHOCARBAMOL 1000 MG/10ML IJ SOLN: 500.0000 mg | Freq: Four times a day (QID) | INTRAMUSCULAR | Status: DC | PRN

## 2023-12-06 MED ORDER — MORPHINE SULFATE (PF) 2 MG/ML IV SOLN: 1.0000 mg | INTRAVENOUS | Status: DC | PRN

## 2023-12-06 MED ORDER — FLEET ENEMA RE ENEM: 1.0000 | ENEMA | Freq: Once | RECTAL | Status: DC | PRN

## 2023-12-06 MED ORDER — BUPIVACAINE IN DEXTROSE 0.75-8.25 % IT SOLN
INTRATHECAL | Status: DC | PRN
  Administered 2023-12-06: 1.8 mL via INTRATHECAL

## 2023-12-06 MED ORDER — IRBESARTAN 150 MG PO TABS
300.0000 mg | ORAL_TABLET | Freq: Every day | ORAL | Status: DC
  Administered 2023-12-07: 300 mg via ORAL
  Filled 2023-12-06: qty 2

## 2023-12-06 MED ORDER — HYDROMORPHONE HCL 1 MG/ML IJ SOLN
0.2500 mg | INTRAMUSCULAR | Status: DC | PRN
  Administered 2023-12-06: 0.5 mg via INTRAVENOUS
  Administered 2023-12-06 (×2): 0.25 mg via INTRAVENOUS

## 2023-12-06 MED ORDER — ORAL CARE MOUTH RINSE: 15.0000 mL | Freq: Once | OROMUCOSAL | Status: AC

## 2023-12-06 MED ORDER — CEFAZOLIN SODIUM-DEXTROSE 2-4 GM/100ML-% IV SOLN
2.0000 g | INTRAVENOUS | Status: AC
  Administered 2023-12-06: 2 g via INTRAVENOUS
  Filled 2023-12-06: qty 100

## 2023-12-06 MED ORDER — FENTANYL CITRATE (PF) 100 MCG/2ML IJ SOLN
INTRAMUSCULAR | Status: AC
  Filled 2023-12-06: qty 2

## 2023-12-06 MED ORDER — FENTANYL CITRATE (PF) 100 MCG/2ML IJ SOLN
INTRAMUSCULAR | Status: DC | PRN
  Administered 2023-12-06: 25 ug via INTRAVENOUS

## 2023-12-06 MED ORDER — LACTATED RINGERS IV SOLN
INTRAVENOUS | Status: DC
Start: 2023-12-06 — End: 2023-12-06

## 2023-12-06 MED ORDER — OXYCODONE HCL 5 MG PO TABS
ORAL_TABLET | ORAL | Status: AC
  Filled 2023-12-06: qty 1

## 2023-12-06 MED ORDER — POLYETHYLENE GLYCOL 3350 17 G PO PACK: 17.0000 g | PACK | Freq: Every day | ORAL | Status: DC | PRN

## 2023-12-06 MED ORDER — SODIUM CHLORIDE 0.9 % IV SOLN: INTRAVENOUS | Status: DC

## 2023-12-06 MED ORDER — MENTHOL 3 MG MT LOZG: 1.0000 | LOZENGE | OROMUCOSAL | Status: DC | PRN

## 2023-12-06 MED ORDER — MUPIROCIN 2 % EX OINT: 1.0000 | TOPICAL_OINTMENT | Freq: Two times a day (BID) | CUTANEOUS | 0 refills | Status: AC

## 2023-12-06 MED ORDER — HYDROCHLOROTHIAZIDE 12.5 MG PO TABS
12.5000 mg | ORAL_TABLET | Freq: Every day | ORAL | Status: DC
  Administered 2023-12-07: 12.5 mg via ORAL
  Filled 2023-12-06: qty 1

## 2023-12-06 MED ORDER — LIDOCAINE HCL (PF) 2 % IJ SOLN
INTRAMUSCULAR | Status: AC
  Filled 2023-12-06: qty 5

## 2023-12-06 MED ORDER — HYDROMORPHONE HCL 1 MG/ML IJ SOLN
INTRAMUSCULAR | Status: AC
  Filled 2023-12-06: qty 1

## 2023-12-06 MED ORDER — HYDRALAZINE HCL 50 MG PO TABS
50.0000 mg | ORAL_TABLET | Freq: Two times a day (BID) | ORAL | Status: DC
  Administered 2023-12-06 – 2023-12-07 (×2): 50 mg via ORAL
  Filled 2023-12-06 (×2): qty 1

## 2023-12-06 MED ORDER — BISACODYL 10 MG RE SUPP: 10.0000 mg | Freq: Every day | RECTAL | Status: DC | PRN

## 2023-12-06 MED ORDER — TRANEXAMIC ACID-NACL 1000-0.7 MG/100ML-% IV SOLN
1000.0000 mg | INTRAVENOUS | Status: AC
  Administered 2023-12-06: 1000 mg via INTRAVENOUS
  Filled 2023-12-06: qty 100

## 2023-12-06 MED ORDER — ACETAMINOPHEN 500 MG PO TABS
1000.0000 mg | ORAL_TABLET | Freq: Four times a day (QID) | ORAL | Status: DC
  Administered 2023-12-06 – 2023-12-07 (×3): 1000 mg via ORAL
  Filled 2023-12-06 (×3): qty 2

## 2023-12-06 MED ORDER — TRAMADOL HCL 50 MG PO TABS: 50.0000 mg | ORAL_TABLET | Freq: Four times a day (QID) | ORAL | Status: DC | PRN

## 2023-12-06 MED ORDER — HYDRALAZINE HCL 50 MG PO TABS: 50.0000 mg | ORAL_TABLET | Freq: Every day | ORAL | Status: DC | PRN

## 2023-12-06 MED ORDER — PROPOFOL 500 MG/50ML IV EMUL
INTRAVENOUS | Status: DC | PRN
Start: 2023-12-06 — End: 2023-12-06
  Administered 2023-12-06: 40 ug/kg/min via INTRAVENOUS

## 2023-12-06 MED ORDER — PHENOL 1.4 % MT LIQD: 1.0000 | OROMUCOSAL | Status: DC | PRN

## 2023-12-06 MED ORDER — ONDANSETRON HCL 4 MG PO TABS: 4.0000 mg | ORAL_TABLET | Freq: Four times a day (QID) | ORAL | Status: DC | PRN

## 2023-12-06 MED ORDER — MORPHINE SULFATE (PF) 4 MG/ML IV SOLN
INTRAVENOUS | Status: AC
  Filled 2023-12-06: qty 1

## 2023-12-06 MED ORDER — METOCLOPRAMIDE HCL 5 MG/ML IJ SOLN: 5.0000 mg | Freq: Three times a day (TID) | INTRAMUSCULAR | Status: DC | PRN

## 2023-12-06 MED ORDER — 0.9 % SODIUM CHLORIDE (POUR BTL) OPTIME
TOPICAL | Status: DC | PRN
Start: 2023-12-06 — End: 2023-12-06
  Administered 2023-12-06: 1000 mL

## 2023-12-06 MED ORDER — PHENYLEPHRINE 80 MCG/ML (10ML) SYRINGE FOR IV PUSH (FOR BLOOD PRESSURE SUPPORT)
PREFILLED_SYRINGE | INTRAVENOUS | Status: AC
  Filled 2023-12-06: qty 10

## 2023-12-06 MED ORDER — PANTOPRAZOLE SODIUM 40 MG PO TBEC
80.0000 mg | DELAYED_RELEASE_TABLET | Freq: Every day | ORAL | Status: DC
  Administered 2023-12-06: 80 mg via ORAL
  Filled 2023-12-06: qty 2

## 2023-12-06 MED ORDER — ONDANSETRON HCL 4 MG/2ML IJ SOLN: 4.0000 mg | Freq: Once | INTRAMUSCULAR | Status: DC | PRN

## 2023-12-06 MED ORDER — POVIDONE-IODINE 10 % EX SWAB: 2.0000 | Freq: Once | CUTANEOUS | Status: DC

## 2023-12-06 MED ORDER — DIPHENHYDRAMINE HCL 12.5 MG/5ML PO ELIX: 12.5000 mg | ORAL_SOLUTION | ORAL | Status: DC | PRN

## 2023-12-06 MED ORDER — PHENYLEPHRINE HCL-NACL 20-0.9 MG/250ML-% IV SOLN
INTRAVENOUS | Status: DC | PRN
  Administered 2023-12-06: 40 ug/min via INTRAVENOUS

## 2023-12-06 MED ORDER — ONDANSETRON HCL 4 MG/2ML IJ SOLN
INTRAMUSCULAR | Status: DC | PRN
  Administered 2023-12-06: 4 mg via INTRAVENOUS

## 2023-12-06 MED ORDER — LACTATED RINGERS IV SOLN: INTRAVENOUS | Status: DC

## 2023-12-06 MED ORDER — MORPHINE SULFATE (PF) 4 MG/ML IV SOLN
1.0000 mg | INTRAVENOUS | Status: DC | PRN
  Administered 2023-12-06: 2 mg via INTRAVENOUS

## 2023-12-06 MED ORDER — WATER FOR IRRIGATION, STERILE IR SOLN
Status: DC | PRN
  Administered 2023-12-06: 1000 mL

## 2023-12-06 MED ORDER — METOCLOPRAMIDE HCL 5 MG PO TABS
5.0000 mg | ORAL_TABLET | Freq: Every day | ORAL | Status: DC
  Administered 2023-12-06: 5 mg via ORAL
  Filled 2023-12-06: qty 1

## 2023-12-06 MED ORDER — METHOCARBAMOL 500 MG PO TABS
500.0000 mg | ORAL_TABLET | Freq: Four times a day (QID) | ORAL | Status: DC | PRN
  Administered 2023-12-06 – 2023-12-07 (×3): 500 mg via ORAL
  Filled 2023-12-06 (×2): qty 1

## 2023-12-06 MED ORDER — ONDANSETRON HCL 4 MG/2ML IJ SOLN
INTRAMUSCULAR | Status: AC
  Filled 2023-12-06: qty 2

## 2023-12-06 MED ORDER — ASPIRIN 81 MG PO CHEW
81.0000 mg | CHEWABLE_TABLET | Freq: Two times a day (BID) | ORAL | Status: DC
  Administered 2023-12-07: 81 mg via ORAL
  Filled 2023-12-06: qty 1

## 2023-12-06 MED ORDER — METOCLOPRAMIDE HCL 5 MG PO TABS: 5.0000 mg | ORAL_TABLET | Freq: Three times a day (TID) | ORAL | Status: DC | PRN

## 2023-12-06 MED ORDER — DEXAMETHASONE SODIUM PHOSPHATE 10 MG/ML IJ SOLN
INTRAMUSCULAR | Status: AC
  Filled 2023-12-06: qty 1

## 2023-12-06 MED ORDER — OXYCODONE HCL 5 MG PO TABS: 5.0000 mg | ORAL_TABLET | Freq: Once | ORAL | Status: DC | PRN

## 2023-12-06 MED ORDER — DEXAMETHASONE SODIUM PHOSPHATE 10 MG/ML IJ SOLN
8.0000 mg | Freq: Once | INTRAMUSCULAR | Status: AC
  Administered 2023-12-06: 8 mg via INTRAVENOUS

## 2023-12-06 MED ORDER — VALSARTAN-HYDROCHLOROTHIAZIDE 320-12.5 MG PO TABS: 1.0000 | ORAL_TABLET | Freq: Every day | ORAL | Status: DC

## 2023-12-06 MED ORDER — DEXMEDETOMIDINE HCL IN NACL 80 MCG/20ML IV SOLN
INTRAVENOUS | Status: DC | PRN
  Administered 2023-12-06: 4 ug via INTRAVENOUS

## 2023-12-06 MED ORDER — PROPOFOL 1000 MG/100ML IV EMUL
INTRAVENOUS | Status: AC
  Filled 2023-12-06: qty 100

## 2023-12-06 MED ORDER — METHOCARBAMOL 500 MG PO TABS
ORAL_TABLET | ORAL | Status: AC
  Filled 2023-12-06: qty 1

## 2023-12-06 MED ORDER — ACETAMINOPHEN 10 MG/ML IV SOLN
1000.0000 mg | Freq: Four times a day (QID) | INTRAVENOUS | Status: DC
  Administered 2023-12-06: 1000 mg via INTRAVENOUS
  Filled 2023-12-06: qty 100

## 2023-12-06 MED ORDER — DEXAMETHASONE SODIUM PHOSPHATE 10 MG/ML IJ SOLN
10.0000 mg | Freq: Once | INTRAMUSCULAR | Status: AC
  Administered 2023-12-07: 10 mg via INTRAVENOUS
  Filled 2023-12-06: qty 1

## 2023-12-06 MED ORDER — PHENYLEPHRINE HCL-NACL 20-0.9 MG/250ML-% IV SOLN
INTRAVENOUS | Status: AC
  Filled 2023-12-06: qty 250

## 2023-12-06 MED ORDER — BUPIVACAINE-EPINEPHRINE (PF) 0.25% -1:200000 IJ SOLN
INTRAMUSCULAR | Status: AC
  Filled 2023-12-06: qty 30

## 2023-12-06 SURGICAL SUPPLY — 36 items
BAG COUNTER SPONGE SURGICOUNT (BAG) IMPLANT
BAG ZIPLOCK 12X15 (MISCELLANEOUS) IMPLANT
BLADE SAG 18X100X1.27 (BLADE) ×1 IMPLANT
COVER PERINEAL POST (MISCELLANEOUS) ×1 IMPLANT
COVER SURGICAL LIGHT HANDLE (MISCELLANEOUS) ×1 IMPLANT
CUP ACETBLR 52 OD PINNACLE (Hips) IMPLANT
DERMABOND ADVANCED .7 DNX12 (GAUZE/BANDAGES/DRESSINGS) ×1 IMPLANT
DRAPE FOOT SWITCH (DRAPES) ×1 IMPLANT
DRAPE STERI IOBAN 125X83 (DRAPES) ×1 IMPLANT
DRAPE U-SHAPE 47X51 STRL (DRAPES) ×2 IMPLANT
DRSG AQUACEL AG ADV 3.5X10 (GAUZE/BANDAGES/DRESSINGS) ×1 IMPLANT
DURAPREP 26ML APPLICATOR (WOUND CARE) ×1 IMPLANT
ELECT REM PT RETURN 15FT ADLT (MISCELLANEOUS) ×1 IMPLANT
GLOVE BIO SURGEON STRL SZ 6.5 (GLOVE) IMPLANT
GLOVE BIO SURGEON STRL SZ7 (GLOVE) IMPLANT
GLOVE BIO SURGEON STRL SZ8 (GLOVE) ×1 IMPLANT
GLOVE BIOGEL PI IND STRL 7.0 (GLOVE) IMPLANT
GLOVE BIOGEL PI IND STRL 8 (GLOVE) ×1 IMPLANT
GOWN STRL REUS W/ TWL LRG LVL3 (GOWN DISPOSABLE) ×2 IMPLANT
HEAD FEM STD 32X+1 STRL (Hips) IMPLANT
HOLDER FOLEY CATH W/STRAP (MISCELLANEOUS) ×1 IMPLANT
KIT TURNOVER KIT A (KITS) IMPLANT
LINER MARATHON NEUT +4X52X32 (Hips) IMPLANT
MANIFOLD NEPTUNE II (INSTRUMENTS) ×1 IMPLANT
PACK ANTERIOR HIP CUSTOM (KITS) ×1 IMPLANT
PENCIL SMOKE EVACUATOR COATED (MISCELLANEOUS) ×1 IMPLANT
SPIKE FLUID TRANSFER (MISCELLANEOUS) ×1 IMPLANT
STEM FEM ACTIS STD SZ7 (Nail) IMPLANT
SUT ETHIBOND NAB CT1 #1 30IN (SUTURE) ×1 IMPLANT
SUT MNCRL AB 4-0 PS2 18 (SUTURE) ×1 IMPLANT
SUT STRATAFIX 0 PDS 27 VIOLET (SUTURE) ×1 IMPLANT
SUT VIC AB 2-0 CT1 TAPERPNT 27 (SUTURE) ×2 IMPLANT
SUTURE STRATFX 0 PDS 27 VIOLET (SUTURE) ×1 IMPLANT
TOWEL GREEN STERILE FF (TOWEL DISPOSABLE) ×1 IMPLANT
TRAY FOLEY MTR SLVR 16FR STAT (SET/KITS/TRAYS/PACK) ×1 IMPLANT
TUBE SUCTION HIGH CAP CLEAR NV (SUCTIONS) ×1 IMPLANT

## 2023-12-06 NOTE — Anesthesia Postprocedure Evaluation (Signed)
 Anesthesia Post Note  Patient: [facility]  Procedure(s) Performed: ARTHROPLASTY, HIP, TOTAL, ANTERIOR APPROACH (Right: Hip)     Patient location during evaluation: PACU Anesthesia Type: Spinal Level of consciousness: oriented and awake and alert Pain management: pain level controlled Vital Signs Assessment: post-procedure vital signs reviewed and stable Respiratory status: spontaneous breathing, respiratory function stable and nonlabored ventilation Cardiovascular status: blood pressure returned to baseline and stable Postop Assessment: no headache, no backache, no apparent nausea or vomiting, spinal receding and patient able to bend at knees Anesthetic complications: no   No notable events documented.  Last Vitals:  Vitals:   12/06/23 1300 12/06/23 1315  BP: 120/61 114/65  Pulse: (!) 56 (!) 57  Resp: 15 13  Temp:    SpO2: 100% 100%    Last Pain:  Vitals:   12/06/23 1315  TempSrc:   PainSc: 0-No pain                 Lilyanna Lunt A.

## 2023-12-06 NOTE — Anesthesia Procedure Notes (Signed)
 Spinal  Patient location during procedure: OR Start time: 12/06/2023 10:47 AM End time: 12/06/2023 10:50 AM Reason for block: surgical anesthesia Staffing Performed: anesthesiologist  Anesthesiologist: Mal Amabile, MD Performed by: Mal Amabile, MD Authorized by: Mal Amabile, MD   Preanesthetic Checklist Completed: patient identified, IV checked, site marked, risks and benefits discussed, surgical consent, monitors and equipment checked, pre-op evaluation and timeout performed Spinal Block Patient position: sitting Prep: DuraPrep and site prepped and draped Patient monitoring: heart rate, cardiac monitor, continuous pulse ox and blood pressure Approach: midline Location: L3-4 Injection technique: single-shot Needle Needle type: Pencan  Needle gauge: 24 G Needle length: 9 cm Needle insertion depth: 7 cm Assessment Sensory level: T6 Events: CSF return Additional Notes Patient tolerated procedure well. Adequate sensory level.

## 2023-12-06 NOTE — Op Note (Signed)
 OPERATIVE REPORT- TOTAL HIP ARTHROPLASTY   PREOPERATIVE DIAGNOSIS: Osteoarthritis of the Right hip.   POSTOPERATIVE DIAGNOSIS: Osteoarthritis of the Right  hip.   PROCEDURE: Right total hip arthroplasty, anterior approach.   SURGEON: Ollen Gross, MD   ASSISTANT: Arther Abbott, PA-C  ANESTHESIA:  Spinal  ESTIMATED BLOOD LOSS:-250 mL    DRAINS: None  COMPLICATIONS: None   CONDITION: PACU - hemodynamically stable.   BRIEF CLINICAL NOTE: Frank Johnston is a 83 y.o. male who has advanced end-  stage arthritis of their Right  hip with progressively worsening pain and  dysfunction.The patient has failed nonoperative management and presents for  total hip arthroplasty.   PROCEDURE IN DETAIL: After successful administration of spinal  anesthetic, the traction boots for the [facility] bed were placed on both  feet and the patient was placed onto the [facility] bed, boots placed into the leg  holders. The Right hip was then isolated from the perineum with plastic  drapes and prepped and draped in the usual sterile fashion. ASIS and  greater trochanter were marked and a oblique incision was made, starting  at about 1 cm lateral and 2 cm distal to the ASIS and coursing towards  the anterior cortex of the femur. The skin was cut with a 10 blade  through subcutaneous tissue to the level of the fascia overlying the  tensor fascia lata muscle. The fascia was then incised in line with the  incision at the junction of the anterior third and posterior 2/3rd. The  muscle was teased off the fascia and then the interval between the TFL  and the rectus was developed. The Hohmann retractor was then placed at  the top of the femoral neck over the capsule. The vessels overlying the  capsule were cauterized and the fat on top of the capsule was removed.  A Hohmann retractor was then placed anterior underneath the rectus  femoris to give exposure to the entire anterior capsule. A T-shaped   capsulotomy was performed. The edges were tagged and the femoral head  was identified.       Osteophytes are removed off the superior acetabulum.  The femoral neck was then cut in situ with an oscillating saw. Traction  was then applied to the left lower extremity utilizing the [facility]  traction. The femoral head was then removed. Retractors were placed  around the acetabulum and then circumferential removal of the labrum was  performed. Osteophytes were also removed. Reaming starts at 49 mm to  medialize and  Increased in 2 mm increments to 51 mm. We reamed in  approximately 40 degrees of abduction, 20 degrees anteversion. A 52 mm  pinnacle acetabular shell was then impacted in anatomic position under  fluoroscopic guidance with excellent purchase. We did not need to place  any additional dome screws. A 32 mm neutral + 4 marathon liner was then  placed into the acetabular shell.       The femoral lift was then placed along the lateral aspect of the femur  just distal to the vastus ridge. The leg was  externally rotated and capsule  was stripped off the inferior aspect of the femoral neck down to the  level of the lesser trochanter, this was done with electrocautery. The femur was lifted after this was performed. The  leg was then placed in an extended and adducted position essentially delivering the femur. We also removed the capsule superiorly and the piriformis from the piriformis fossa to  gain excellent exposure of the  proximal femur. Rongeur was used to remove some cancellous bone to get  into the lateral portion of the proximal femur for placement of the  initial starter reamer. The starter broaches was placed  the starter broach  and was shown to go down the center of the canal. Broaching  with the Actis system was then performed starting at size 0  coursing  Up to size 7. A size 7 had excellent torsional and rotational  and axial stability. The trial standard offset neck was then  placed  with a 32 +1 trial head. The hip was then reduced. We confirmed that  the stem was in the canal both on AP and lateral x-rays. It also has excellent sizing. The hip was reduced with outstanding stability through full extension and full external rotation.. AP pelvis was taken and the leg lengths were measured and found to be equal. Hip was then dislocated again and the femoral head and neck removed. The  femoral broach was removed. Size 7 Actis stem with a standard offset  neck was then impacted into the femur following native anteversion. Has  excellent purchase in the canal. Excellent torsional and rotational and  axial stability. It is confirmed to be in the canal on AP and lateral  fluoroscopic views. The 32 + 1 metal head was placed and the hip  reduced with outstanding stability. Again AP pelvis was taken and it  confirmed that the leg lengths were equal. The wound was then copiously  irrigated with saline solution and the capsule reattached and repaired  with Ethibond suture. 30 ml of .25% Bupivicaine was  injected into the capsule and into the edge of the tensor fascia lata as well as subcutaneous tissue. The fascia overlying the tensor fascia lata was then closed with a running #1 V-Loc. Subcu was closed with interrupted 2-0 Vicryl and subcuticular running 4-0 Monocryl. Incision was cleaned  and dried. Steri-Strips and a bulky sterile dressing applied. The patient was awakened and transported to  recovery in stable condition.        Please note that a surgical assistant was a medical necessity for this procedure to perform it in a safe and expeditious manner. Assistant was necessary to provide appropriate retraction of vital neurovascular structures and to prevent femoral fracture and allow for anatomic placement of the prosthesis.  Ollen Gross, M.D.

## 2023-12-06 NOTE — Plan of Care (Signed)
  Problem: Education: Goal: Knowledge of General Education information will improve Description: Including pain rating scale, medication(s)/side effects and non-pharmacologic comfort measures Outcome: Progressing   Problem: Clinical Measurements: Goal: Ability to maintain clinical measurements within normal limits will improve Outcome: Progressing Goal: Will remain free from infection Outcome: Progressing   Problem: Pain Managment: Goal: General experience of comfort will improve and/or be controlled Outcome: Progressing   Problem: Activity: Goal: Ability to avoid complications of mobility impairment will improve Outcome: Progressing Goal: Ability to tolerate increased activity will improve Outcome: Progressing   Problem: Clinical Measurements: Goal: Postoperative complications will be avoided or minimized Outcome: Progressing

## 2023-12-06 NOTE — Interval H&P Note (Signed)
 History and Physical Interval Note:  12/06/2023 9:50 AM  Frank Johnston  has presented today for surgery, with the diagnosis of right hip osteoarthritis.  The various methods of treatment have been discussed with the patient and family. After consideration of risks, benefits and other options for treatment, the patient has consented to  Procedure(s): ARTHROPLASTY, HIP, TOTAL, ANTERIOR APPROACH (Right) as a surgical intervention.  The patient's history has been reviewed, patient examined, no change in status, stable for surgery.  I have reviewed the patient's chart and labs.  Questions were answered to the patient's satisfaction.     [name]

## 2023-12-06 NOTE — Anesthesia Procedure Notes (Signed)
 Procedure Name: MAC Date/Time: 12/06/2023 10:50 AM  Performed by: Micki Riley, CRNAPre-anesthesia Checklist: Patient identified, Emergency Drugs available, Suction available, Patient being monitored and Timeout performed Patient Re-evaluated:Patient Re-evaluated prior to induction Oxygen Delivery Method: Circle system utilized Preoxygenation: Pre-oxygenation with 100% oxygen Airway Equipment and Method: Oral airway

## 2023-12-06 NOTE — Anesthesia Preprocedure Evaluation (Addendum)
 Anesthesia Evaluation  Patient identified by MRN, date of birth, ID band Patient awake    Reviewed: Allergy & Precautions, NPO status , Patient's Chart, lab work & pertinent test results, reviewed documented beta blocker date and time   History of Anesthesia Complications Negative for: history of anesthetic complications  Airway Mallampati: II  TM Distance: >3 FB     Dental  (+) Teeth Intact, Caps, Dental Advisory Given   Pulmonary shortness of breath and with exertion, pneumonia, resolved, former smoker, PE   Pulmonary exam normal breath sounds clear to auscultation       Cardiovascular hypertension, Pt. on medications + DVT  Normal cardiovascular exam Rhythm:Regular Rate:Normal     Neuro/Psych negative neurological ROS  negative psych ROS   GI/Hepatic Neg liver ROS, hiatal hernia,GERD  Medicated,,  Endo/Other  negative endocrine ROS    Renal/GU Renal diseaseHx/o renal calculi  negative genitourinary   Musculoskeletal  (+) Arthritis , Osteoarthritis,  OA right hip   Abdominal   Peds  Hematology negative hematology ROS (+)   Anesthesia Other Findings   Reproductive/Obstetrics                             Anesthesia Physical Anesthesia Plan  ASA: 2  Anesthesia Plan: Spinal   Post-op Pain Management: Dilaudid IV   Induction: Intravenous  PONV Risk Score and Plan: 2 and Treatment may vary due to age or medical condition and Propofol infusion  Airway Management Planned: Natural Airway and Simple Face Mask  Additional Equipment: None  Intra-op Plan:   Post-operative Plan:   Informed Consent: I have reviewed the patients History and Physical, chart, labs and discussed the procedure including the risks, benefits and alternatives for the proposed anesthesia with the patient or authorized representative who has indicated his/her understanding and acceptance.     Dental advisory  given  Plan Discussed with: Anesthesiologist and CRNA  Anesthesia Plan Comments:         Anesthesia Quick Evaluation

## 2023-12-06 NOTE — Discharge Instructions (Signed)
Frank Aluisio, MD Total Joint Specialist [facility] [address] [place], [place] [address] [phone]  ANTERIOR APPROACH TOTAL HIP REPLACEMENT POSTOPERATIVE DIRECTIONS     Hip Rehabilitation, Guidelines Following Surgery  The results of a hip operation are greatly improved after range of motion and muscle strengthening exercises. Follow all safety measures which are given to protect your hip. If any of these exercises cause increased pain or swelling in your joint, decrease the amount until you are comfortable again. Then slowly increase the exercises. Call your caregiver if you have problems or questions.   BLOOD CLOT PREVENTION Take an 81 mg Aspirin two times a day for three weeks following surgery. Then resume one 81 mg Aspirin once a day. You may resume your vitamins/supplements upon discharge from the hospital. Do not take any NSAIDs (Advil, Aleve, Ibuprofen, Meloxicam, etc.) until you have discontinued the 81 mg Aspirin twice a day.  HOME CARE INSTRUCTIONS  Remove items at home which could result in a fall. This includes throw rugs or furniture in walking pathways.  ICE to the affected hip as frequently as 20-30 minutes an hour and then as needed for pain and swelling. Continue to use ice on the hip for pain and swelling from surgery. You may notice swelling that will progress down to the foot and ankle. This is normal after surgery. Elevate the leg when you are not up walking on it.   Continue to use the breathing machine which will help keep your temperature down.  It is common for your temperature to cycle up and down following surgery, especially at night when you are not up moving around and exerting yourself.  The breathing machine keeps your lungs expanded and your temperature down.  DIET You may resume your previous home diet once your are discharged from the hospital.  DRESSING / WOUND CARE / SHOWERING You have an adhesive waterproof  bandage over the incision. Leave this in place until your first follow-up appointment. Once you remove this you will not need to place another bandage.  You may begin showering 3 days following surgery, but do not submerge the incision under water.  ACTIVITY For the first 3-5 days, it is important to rest and keep the operative leg elevated. You should, as a general rule, rest for 50 minutes and walk/stretch for 10 minutes per hour. After 5 days, you may slowly increase activity as tolerated.  Perform the exercises you were provided twice a day for about 15-20 minutes each session. Begin these 2 days following surgery. Walk with your walker as instructed. Use the walker until you are comfortable transitioning to a cane. Walk with the cane in the opposite hand of the operative leg. You may discontinue the cane once you are comfortable and walking steadily. Avoid periods of inactivity such as sitting longer than an hour when not asleep. This helps prevent blood clots.  Do not drive a car for 6 weeks or until released by your surgeon.  Do not drive while taking narcotics.  TED HOSE STOCKINGS Wear the elastic stockings on both legs for three weeks following surgery during the day. You may remove them at night while sleeping.  WEIGHT BEARING Weight bearing as tolerated with assist device (walker, cane, etc) as directed, use it as long as suggested by your surgeon or therapist, typically at least 4-6 weeks.  POSTOPERATIVE CONSTIPATION PROTOCOL Constipation - defined medically as fewer than three stools per week and severe constipation as less than one   stool per week.  One of the most common issues patients have following surgery is constipation.  Even if you have a regular bowel pattern at home, your normal regimen is likely to be disrupted due to multiple reasons following surgery.  Combination of anesthesia, postoperative narcotics, change in appetite and fluid intake all can affect your bowels.  In  order to avoid complications following surgery, here are some recommendations in order to help you during your recovery period.  Colace (docusate) - Pick up an over-the-counter form of Colace or another stool softener and take twice a day as long as you are requiring postoperative pain medications.  Take with a full glass of water daily.  If you experience loose stools or diarrhea, hold the colace until you stool forms back up.  If your symptoms do not get better within 1 week or if they get worse, check with your doctor. Dulcolax (bisacodyl) - Pick up over-the-counter and take as directed by the product packaging as needed to assist with the movement of your bowels.  Take with a full glass of water.  Use this product as needed if not relieved by Colace only.  MiraLax (polyethylene glycol) - Pick up over-the-counter to have on hand.  MiraLax is a solution that will increase the amount of water in your bowels to assist with bowel movements.  Take as directed and can mix with a glass of water, juice, soda, coffee, or tea.  Take if you go more than two days without a movement.Do not use MiraLax more than once per day. Call your doctor if you are still constipated or irregular after using this medication for 7 days in a row.  If you continue to have problems with postoperative constipation, please contact the office for further assistance and recommendations.  If you experience "the worst abdominal pain ever" or develop nausea or vomiting, please contact the office immediatly for further recommendations for treatment.  ITCHING  If you experience itching with your medications, try taking only a single pain pill, or even half a pain pill at a time.  You can also use Benadryl over the counter for itching or also to help with sleep.   MEDICATIONS See your medication summary on the "After Visit Summary" that the nursing staff will review with you prior to discharge.  You may have some home medications which will  be placed on hold until you complete the course of blood thinner medication.  It is important for you to complete the blood thinner medication as prescribed by your surgeon.  Continue your approved medications as instructed at time of discharge.  PRECAUTIONS If you experience chest pain or shortness of breath - call 911 immediately for transfer to the hospital emergency department.  If you develop a fever greater that 101 F, purulent drainage from wound, increased redness or drainage from wound, foul odor from the wound/dressing, or calf pain - CONTACT YOUR SURGEON.                                                   FOLLOW-UP APPOINTMENTS Make sure you keep all of your appointments after your operation with your surgeon and caregivers. You should call the office at the above phone number and make an appointment for approximately two weeks after the date of your surgery or on the date instructed by   your surgeon outlined in the "After Visit Summary".  RANGE OF MOTION AND STRENGTHENING EXERCISES  These exercises are designed to help you keep full movement of your hip joint. Follow your caregiver's or physical therapist's instructions. Perform all exercises about fifteen times, three times per day or as directed. Exercise both hips, even if you have had only one joint replacement. These exercises can be done on a training (exercise) mat, on the floor, on a table or on a bed. Use whatever works the best and is most comfortable for you. Use music or television while you are exercising so that the exercises are a pleasant break in your day. This will make your life better with the exercises acting as a break in routine you can look forward to.  Lying on your back, slowly slide your foot toward your buttocks, raising your knee up off the floor. Then slowly slide your foot back down until your leg is straight again.  Lying on your back spread your legs as far apart as you can without causing discomfort.  Lying on  your side, raise your upper leg and foot straight up from the floor as far as is comfortable. Slowly lower the leg and repeat.  Lying on your back, tighten up the muscle in the front of your thigh (quadriceps muscles). You can do this by keeping your leg straight and trying to raise your heel off the floor. This helps strengthen the largest muscle supporting your knee.  Lying on your back, tighten up the muscles of your buttocks both with the legs straight and with the knee bent at a comfortable angle while keeping your heel on the floor.   POST-OPERATIVE OPIOID TAPER INSTRUCTIONS: It is important to wean off of your opioid medication as soon as possible. If you do not need pain medication after your surgery it is ok to stop day one. Opioids include: Codeine, Hydrocodone(Norco, Vicodin), Oxycodone(Percocet, oxycontin) and hydromorphone amongst others.  Long term and even short term use of opiods can cause: Increased pain response Dependence Constipation Depression Respiratory depression And more.  Withdrawal symptoms can include Flu like symptoms Nausea, vomiting And more Techniques to manage these symptoms Hydrate well Eat regular healthy meals Stay active Use relaxation techniques(deep breathing, meditating, yoga) Do Not substitute Alcohol to help with tapering If you have been on opioids for less than two weeks and do not have pain than it is ok to stop all together.  Plan to wean off of opioids This plan should start within one week post op of your joint replacement. Maintain the same interval or time between taking each dose and first decrease the dose.  Cut the total daily intake of opioids by one tablet each day Next start to increase the time between doses. The last dose that should be eliminated is the evening dose.   IF YOU ARE TRANSFERRED TO A SKILLED REHAB FACILITY If the patient is transferred to a skilled rehab facility following release from the hospital, a list of  the current medications will be sent to the facility for the patient to continue.  When discharged from the skilled rehab facility, please have the facility set up the patient's Home Health Physical Therapy prior to being released. Also, the skilled facility will be responsible for providing the patient with their medications at time of release from the facility to include their pain medication, the muscle relaxants, and their blood thinner medication. If the patient is still at the rehab facility at time of   the two week follow up appointment, the skilled rehab facility will also need to assist the patient in arranging follow up appointment in our office and any transportation needs.  MAKE SURE YOU:  Understand these instructions.  Get help right away if you are not doing well or get worse.    DENTAL ANTIBIOTICS:  In most cases prophylactic antibiotics for Dental procdeures after total joint surgery are not necessary.  Exceptions are as follows:  1. History of prior total joint infection  2. Severely immunocompromised (Organ Transplant, cancer chemotherapy, Rheumatoid biologic meds such as Humera)  3. Poorly controlled diabetes (A1C &gt; 8.0, blood glucose over 200)  If you have one of these conditions, contact your surgeon for an antibiotic prescription, prior to your dental procedure.    Pick up stool softner and laxative for home use following surgery while on pain medications. Do not submerge incision under water. Please use good hand washing techniques while changing dressing each day. May shower starting three days after surgery. Please use a clean towel to pat the incision dry following showers. Continue to use ice for pain and swelling after surgery. Do not use any lotions or creams on the incision until instructed by your surgeon. 

## 2023-12-06 NOTE — Evaluation (Signed)
 Physical Therapy Evaluation Patient Details Name: Frank Johnston MRN: [id number] DOB: May 28, 1941 Today's Date: 12/06/2023  History of Present Illness  83 yo male presents to therapy s/p R THA, anterior approach on 12/06/2023 due to failure of conservative measures. Pt PMH includes but is not limited to: DVT, PE, HTN, ileus, arthritis, CKD, GERD, hiatal hernia, back surgery, L TKA (2012).  Clinical Impression  Frank Johnston is a 83 y.o. male POD 0 s/p R THA. Patient reports IND with mobility at baseline. Patient is now limited by functional impairments (see PT problem list below) and requires S for bed mobility and CGA for transfers. Patient was able to ambulate 50 feet with RW and CGA level of assist. Patient instructed in exercise to facilitate ROM and circulation to manage edema.  Patient will benefit from continued skilled PT interventions to address impairments and progress towards PLOF. Acute PT will follow to progress mobility and stair training in preparation for safe discharge home with family support and HEP.      If plan is discharge home, recommend the following: A little help with walking and/or transfers;A little help with bathing/dressing/bathroom;Assistance with cooking/housework;Assist for transportation;Help with stairs or ramp for entrance   Can travel by private vehicle        Equipment Recommendations Rolling walker (2 wheels)  Recommendations for Other Services       Functional Status Assessment Patient has had a recent decline in their functional status and demonstrates the ability to make significant improvements in function in a reasonable and predictable amount of time.     Precautions / Restrictions Precautions Precautions: Fall Restrictions Weight Bearing Restrictions Per Provider Order: No      Mobility  Bed Mobility Overal bed mobility: Needs Assistance Bed Mobility: Supine to Sit     Supine to sit: Supervision, HOB elevated, Used rails      General bed mobility comments: min cues    Transfers Overall transfer level: Needs assistance Equipment used: Rolling walker (2 wheels) Transfers: Sit to/from Stand Sit to Stand: Contact guard assist           General transfer comment: min cues    Ambulation/Gait Ambulation/Gait assistance: Contact guard assist Gait Distance (Feet): 50 Feet Assistive device: Rolling walker (2 wheels) Gait Pattern/deviations: Step-to pattern, Antalgic, Trunk flexed Gait velocity: decreased     General Gait Details: slight trunk flexion with B UE support at RW to offlaod R LE in stance phase, min cues for safety, posture and RW management  Stairs            Wheelchair Mobility     Tilt Bed    Modified Rankin (Stroke Patients Only)       Balance Overall balance assessment: Needs assistance Sitting-balance support: Feet supported Sitting balance-Leahy Scale: Good     Standing balance support: Bilateral upper extremity supported, During functional activity, Reliant on assistive device for balance Standing balance-Leahy Scale: Fair                               Pertinent Vitals/Pain Pain Assessment Pain Assessment: 0-10 Pain Score: 2  Pain Location: R hip and LE Pain Descriptors / Indicators: Aching, Dull, Discomfort, Grimacing, Operative site guarding, Burning Pain Intervention(s): Limited activity within patient's tolerance, Monitored during session, Premedicated before session, Repositioned, Ice applied    Home Living Family/patient expects to be discharged to:: Private residence Living Arrangements: Spouse/significant other Available Help at Discharge: Family (daughter  lives close) Type of Home: Mobile home Home Access: Stairs to enter Entrance Stairs-Rails: Right;Left;Can reach both Secretary/administrator of Steps: 4   Home Layout: One level Home Equipment: BSC/3in1;Cane - single point      Prior Function Prior Level of Function :  Independent/Modified Independent;Driving             Mobility Comments: IND without AD for all ADLs, self care tasks and IADls       Extremity/Trunk Assessment        Lower Extremity Assessment Lower Extremity Assessment: RLE deficits/detail RLE Deficits / Details: ankle DF/PF 5/5 RLE Sensation: WNL    Cervical / Trunk Assessment Cervical / Trunk Assessment: Back Surgery  Communication   Communication Communication: No apparent difficulties    Cognition Arousal: Alert Behavior During Therapy: WFL for tasks assessed/performed   PT - Cognitive impairments: No apparent impairments                         Following commands: Intact       Cueing       General Comments      Exercises Total Joint Exercises Ankle Circles/Pumps: AROM, Both, 10 reps   Assessment/Plan    PT Assessment Patient needs continued PT services  PT Problem List Decreased strength;Decreased range of motion;Decreased activity tolerance;Decreased balance;Decreased mobility;Decreased coordination;Pain       PT Treatment Interventions DME instruction;Gait training;Stair training;Functional mobility training;Therapeutic activities;Therapeutic exercise;Balance training;Neuromuscular re-education;Patient/family education;Modalities    PT Goals (Current goals can be found in the Care Plan section)  Acute Rehab PT Goals Patient Stated Goal: to be able to get out and work in the yard PT Goal Formulation: With patient Time For Goal Achievement: 12/20/23 Potential to Achieve Goals: Good    Frequency 7X/week     Co-evaluation               AM-PAC PT "6 Clicks" Mobility  Outcome Measure Help needed turning from your back to your side while in a flat bed without using bedrails?: None Help needed moving from lying on your back to sitting on the side of a flat bed without using bedrails?: A Little Help needed moving to and from a bed to a chair (including a wheelchair)?: A  Little Help needed standing up from a chair using your arms (e.g., wheelchair or bedside chair)?: A Little Help needed to walk in hospital room?: A Little Help needed climbing 3-5 steps with a railing? : A Lot 6 Click Score: 18    End of Session Equipment Utilized During Treatment: Gait belt Activity Tolerance: No increased pain;Patient tolerated treatment well Patient left: in chair;with call bell/phone within reach Nurse Communication: Mobility status PT Visit Diagnosis: Unsteadiness on feet (R26.81);Other abnormalities of gait and mobility (R26.89);Muscle weakness (generalized) (M62.81);Difficulty in walking, not elsewhere classified (R26.2);Pain Pain - Right/Left: Left Pain - part of body: Hip;Leg    Time: 1610-9604 PT Time Calculation (min) (ACUTE ONLY): 34 min   Charges:   PT Evaluation $PT Eval Low Complexity: 1 Low PT Treatments $Gait Training: 8-22 mins PT General Charges $$ ACUTE PT VISIT: 1 Visit         Johnny Bridge, PT Acute Rehab   Jacqualyn Posey 12/06/2023, 6:33 PM

## 2023-12-06 NOTE — Transfer of Care (Signed)
 Immediate Anesthesia Transfer of Care Note  Patient: Frank Johnston  Procedure(s) Performed: ARTHROPLASTY, HIP, TOTAL, ANTERIOR APPROACH (Right: Hip)  Patient Location: PACU  Anesthesia Type:MAC and Spinal  Level of Consciousness: awake, alert , and oriented  Airway & Oxygen Therapy: Patient Spontanous Breathing and Patient connected to face mask oxygen  Post-op Assessment: Report given to RN and Post -op Vital signs reviewed and stable  Post vital signs: stable  Last Vitals:  Vitals Value Taken Time  BP 109/58 12/06/23 1221  Temp 36.4 C 12/06/23 1221  Pulse 62 12/06/23 1222  Resp 25 12/06/23 1222  SpO2 100 % 12/06/23 1222  Vitals shown include unfiled device data.  Last Pain:  Vitals:   12/06/23 1221  TempSrc:   PainSc: 0-No pain         Complications: No notable events documented.

## 2023-12-07 ENCOUNTER — Other Ambulatory Visit (HOSPITAL_COMMUNITY): Payer: Self-pay

## 2023-12-07 ENCOUNTER — Encounter (HOSPITAL_COMMUNITY): Payer: Self-pay | Admitting: Orthopedic Surgery

## 2023-12-07 DIAGNOSIS — Z79899 Other long term (current) drug therapy: Secondary | ICD-10-CM | POA: Diagnosis not present

## 2023-12-07 DIAGNOSIS — Z87891 Personal history of nicotine dependence: Secondary | ICD-10-CM | POA: Diagnosis not present

## 2023-12-07 DIAGNOSIS — Z7982 Long term (current) use of aspirin: Secondary | ICD-10-CM | POA: Diagnosis not present

## 2023-12-07 DIAGNOSIS — M1611 Unilateral primary osteoarthritis, right hip: Secondary | ICD-10-CM | POA: Diagnosis not present

## 2023-12-07 DIAGNOSIS — I129 Hypertensive chronic kidney disease with stage 1 through stage 4 chronic kidney disease, or unspecified chronic kidney disease: Secondary | ICD-10-CM | POA: Diagnosis not present

## 2023-12-07 DIAGNOSIS — N189 Chronic kidney disease, unspecified: Secondary | ICD-10-CM | POA: Diagnosis not present

## 2023-12-07 DIAGNOSIS — Z96653 Presence of artificial knee joint, bilateral: Secondary | ICD-10-CM | POA: Diagnosis not present

## 2023-12-07 DIAGNOSIS — Z86718 Personal history of other venous thrombosis and embolism: Secondary | ICD-10-CM | POA: Diagnosis not present

## 2023-12-07 DIAGNOSIS — Z86711 Personal history of pulmonary embolism: Secondary | ICD-10-CM | POA: Diagnosis not present

## 2023-12-07 DIAGNOSIS — Z85828 Personal history of other malignant neoplasm of skin: Secondary | ICD-10-CM | POA: Diagnosis not present

## 2023-12-07 LAB — CBC
HCT: 40.8 % (ref 39.0–52.0)
Hemoglobin: 13.6 g/dL (ref 13.0–17.0)
MCH: 29.5 pg (ref 26.0–34.0)
MCHC: 33.3 g/dL (ref 30.0–36.0)
MCV: 88.5 fL (ref 80.0–100.0)
Platelets: 209 10*3/uL (ref 150–400)
RBC: 4.61 MIL/uL (ref 4.22–5.81)
RDW: 12.8 % (ref 11.5–15.5)
WBC: 14.8 10*3/uL — ABNORMAL HIGH (ref 4.0–10.5)
nRBC: 0 % (ref 0.0–0.2)

## 2023-12-07 LAB — BASIC METABOLIC PANEL
Anion gap: 10 (ref 5–15)
BUN: 24 mg/dL — ABNORMAL HIGH (ref 8–23)
CO2: 21 mmol/L — ABNORMAL LOW (ref 22–32)
Calcium: 9.2 mg/dL (ref 8.9–10.3)
Chloride: 105 mmol/L (ref 98–111)
Creatinine, Ser: 1.26 mg/dL — ABNORMAL HIGH (ref 0.61–1.24)
GFR, Estimated: 57 mL/min — ABNORMAL LOW (ref >60)
Glucose, Bld: 185 mg/dL — ABNORMAL HIGH (ref 70–99)
Potassium: 3.9 mmol/L (ref 3.5–5.1)
Sodium: 136 mmol/L (ref 135–145)

## 2023-12-07 MED ORDER — ASPIRIN 81 MG PO CHEW
81.0000 mg | CHEWABLE_TABLET | Freq: Two times a day (BID) | ORAL | 0 refills | Status: DC
  Filled 2023-12-07: qty 40, 20d supply, fill #0

## 2023-12-07 MED ORDER — OXYCODONE HCL 5 MG PO TABS
5.0000 mg | ORAL_TABLET | Freq: Four times a day (QID) | ORAL | 0 refills | Status: AC | PRN
Start: 2023-12-07 — End: ?
  Filled 2023-12-07: qty 42, 6d supply, fill #0

## 2023-12-07 MED ORDER — METHOCARBAMOL 500 MG PO TABS
500.0000 mg | ORAL_TABLET | Freq: Four times a day (QID) | ORAL | 0 refills | Status: DC | PRN
  Filled 2023-12-07: qty 40, 10d supply, fill #0

## 2023-12-07 MED ORDER — ONDANSETRON HCL 4 MG PO TABS
4.0000 mg | ORAL_TABLET | Freq: Four times a day (QID) | ORAL | 0 refills | Status: DC | PRN
  Filled 2023-12-07: qty 20, 5d supply, fill #0

## 2023-12-07 MED ORDER — TRAMADOL HCL 50 MG PO TABS
50.0000 mg | ORAL_TABLET | Freq: Four times a day (QID) | ORAL | 0 refills | Status: AC | PRN
Start: 2023-12-07 — End: ?
  Filled 2023-12-07: qty 40, 5d supply, fill #0

## 2023-12-07 NOTE — TOC Transition Note (Signed)
 Transition of Care [facility]) - Discharge Note   Patient Details  Name: Frank Johnston MRN: [id number] Date of Birth: 1941/03/26  Transition of Care [facility]) CM/SW Contact:  Amada Jupiter, LCSW Phone Number: 12/07/2023, 11:04 AM   Clinical Narrative:     Met with pt who confirms need for RW and no DME agency preference - order placed with [employer] and delivered to room.  Plan for HEP.  No further TOC needs.  Final next level of care: Home/Self Care Barriers to Discharge: No Barriers Identified   Patient Goals and CMS Choice Patient states their goals for this hospitalization and ongoing recovery are:: return home          Discharge Placement                       Discharge Plan and Services Additional resources added to the After Visit Summary for                  DME Arranged: Walker rolling DME Agency: [employer] Date DME Agency Contacted: 12/07/23 Time DME Agency Contacted: 0930 Representative spoke with at DME Agency: Yvonna Alanis            Social Drivers of Health (SDOH) Interventions SDOH Screenings   Food Insecurity: No Food Insecurity (12/06/2023)  Housing: Low Risk  (12/06/2023)  Transportation Needs: No Transportation Needs (12/06/2023)  Utilities: Not At Risk (12/06/2023)  Social Connections: Unknown (12/06/2023)  Tobacco Use: Medium Risk (12/06/2023)     Readmission Risk Interventions     No data to display

## 2023-12-07 NOTE — Care Management Obs Status (Signed)
 MEDICARE OBSERVATION STATUS NOTIFICATION   Patient Details  Name: Frank Johnston MRN: [id number] Date of Birth: 1941-04-09   Medicare Observation Status Notification Given:  Yes    Amada Jupiter, LCSW 12/07/2023, 11:08 AM

## 2023-12-07 NOTE — Progress Notes (Signed)
 TOC meds to bed delivered from outpatient pharmacy at [facility] by This RN

## 2023-12-07 NOTE — Progress Notes (Signed)
 Physical Therapy Treatment Patient Details Name: Frank Johnston MRN: [id number] DOB: 1941-04-05 Today's Date: 12/07/2023   History of Present Illness 83 yo male presents to therapy s/p R THA, anterior approach on 12/06/2023 due to failure of conservative measures. Pt PMH includes but is not limited to: DVT, PE, HTN, ileus, arthritis, CKD, GERD, hiatal hernia, back surgery, L TKA (2012).    PT Comments  POD # 1 am session PT - Cognition Comments: AxO x 3 pleasant and motivated.  Had previous TKR so "some what" knowledgable.  Lives home with Spouse.  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 Assisted OOB went  well.  General bed mobility comments: demonstarted and Educated on using belt to self asisst LE  General transfer comment: <25% VC's on proper hand placement and increased time.  General Gait Details: tolerated a functional distance.  Then returned to room to perform some TE's following HEP handout.  Instructed on proper tech, freq as well as use of ICE.   Addressed all mobility questions, discussed appropriate activity, educated on use of ICE.  Pt ready for D/C to home.    If plan is discharge home, recommend the following: A little help with walking and/or transfers;A little help with bathing/dressing/bathroom;Assistance with cooking/housework;Assist for transportation;Help with stairs or ramp for entrance   Can travel by private vehicle        Equipment Recommendations  Rolling walker (2 wheels)    Recommendations for Other Services       Precautions / Restrictions Precautions Precautions: Fall Restrictions Weight Bearing Restrictions Per Provider Order: No     Mobility  Bed Mobility Overal bed mobility: Needs Assistance Bed Mobility: Supine to Sit           General bed mobility comments: demonstarted and Educated on using belt to self asisst LE    Transfers Overall transfer level: Needs assistance Equipment used: Rolling walker (2 wheels) Transfers: Sit to/from  Stand Sit to Stand: Contact guard assist, Supervision           General transfer comment: <25% VC's on proper hand placement and increased time.    Ambulation/Gait Ambulation/Gait assistance: Supervision Gait Distance (Feet): 75 Feet Assistive device: Rolling walker (2 wheels) Gait Pattern/deviations: Step-to pattern, Antalgic, Trunk flexed Gait velocity: decreased     General Gait Details: tolerated a functional distance   Stairs Stairs:  (no stairs/level entry)           Wheelchair Mobility     Tilt Bed    Modified Rankin (Stroke Patients Only)       Balance                                            Communication    Cognition Arousal: Alert Behavior During Therapy: WFL for tasks assessed/performed   PT - Cognitive impairments: No apparent impairments                       PT - Cognition Comments: AxO x 3 pleasant and motivated.  Had previous TKR so "some what" knowledgable.  Lives home with Spouse.  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 Following commands: Intact      Cueing    Exercises  Total Hip Replacement TE's following HEP Handout 10 reps ankle pumps 05 reps knee presses 05 reps heel slides 05 reps SAQ's 05 reps ABD Instructed how  to use a belt loop to assist  Followed by ICE     General Comments        Pertinent Vitals/Pain Pain Assessment Pain Assessment: 0-10 Pain Score: 3  Pain Location: R hip and LE Pain Intervention(s): Monitored during session, Premedicated before session, Ice applied    Home Living                          Prior Function            PT Goals (current goals can now be found in the care plan section) Progress towards PT goals: Progressing toward goals    Frequency    7X/week      PT Plan      Co-evaluation              AM-PAC PT "6 Clicks" Mobility   Outcome Measure  Help needed turning from your back to your side while in a flat bed  without using bedrails?: None Help needed moving from lying on your back to sitting on the side of a flat bed without using bedrails?: None Help needed moving to and from a bed to a chair (including a wheelchair)?: None Help needed standing up from a chair using your arms (e.g., wheelchair or bedside chair)?: None Help needed to walk in hospital room?: None Help needed climbing 3-5 steps with a railing? : A Little 6 Click Score: 23    End of Session Equipment Utilized During Treatment: Gait belt Activity Tolerance: No increased pain;Patient tolerated treatment well Patient left: in chair;with call bell/phone within reach Nurse Communication: Mobility status PT Visit Diagnosis: Unsteadiness on feet (R26.81);Other abnormalities of gait and mobility (R26.89);Muscle weakness (generalized) (M62.81);Difficulty in walking, not elsewhere classified (R26.2);Pain Pain - Right/Left: Left Pain - part of body: Hip;Leg     Time: 0932-1000 PT Time Calculation (min) (ACUTE ONLY): 28 min  Charges:    $Gait Training: 8-22 mins $Therapeutic Exercise: 8-22 mins PT General Charges $$ ACUTE PT VISIT: 1 Visit                     Felecia Shelling  PTA Acute  Rehabilitation Services Office M-F          [phone]

## 2023-12-07 NOTE — Progress Notes (Signed)
   Subjective: 1 Day Post-Op Procedure(s) (LRB): ARTHROPLASTY, HIP, TOTAL, ANTERIOR APPROACH (Right) Patient seen in rounds by Dr. Lequita Halt. Patient is well, and has had no acute complaints or problems. Denies SOB or chest pain. Denies calf pain. Foley cath removed this AM. Patient reports pain as moderate. Worked with physical therapy yesterday and ambulated 50'. We will continue physical therapy today.   Objective: Vital signs in last 24 hours: Temp:  [97.3 F (36.3 C)-97.9 F (36.6 C)] 97.9 F (36.6 C) (03/06 0700) Pulse Rate:  [56-87] 76 (03/06 0700) Resp:  [12-19] 16 (03/06 0700) BP: (109-158)/(58-85) 120/81 (03/06 0700) SpO2:  [96 %-100 %] 100 % (03/06 0700) Weight:  [77.6 kg] 77.6 kg (03/05 0848)  Intake/Output from previous day:  Intake/Output Summary (Last 24 hours) at 12/07/2023 0736 Last data filed at 12/07/2023 0102 Gross per 24 hour  Intake 1186.75 ml  Output 3000 ml  Net -1813.25 ml     Intake/Output this shift: No intake/output data recorded.  Labs: Recent Labs    12/07/23 0322  HGB 13.6   Recent Labs    12/07/23 0322  WBC 14.8*  RBC 4.61  HCT 40.8  PLT 209   Recent Labs    12/07/23 0322  NA 136  K 3.9  CL 105  CO2 21*  BUN 24*  CREATININE 1.26*  GLUCOSE 185*  CALCIUM 9.2   No results for input(s): "LABPT", "INR" in the last 72 hours.  Exam: General - Patient is Alert and Oriented Extremity - Neurologically intact Neurovascular intact Sensation intact distally Dorsiflexion/Plantar flexion intact Dressing - dressing C/D/I Motor Function - intact, moving foot and toes well on exam.  Past Medical History:  Diagnosis Date   Arthritis    Cancer (HCC)    basal cell to forehead   Chest pain 10/02/2015   Chronic kidney disease    hx kidney stones   GERD (gastroesophageal reflux disease)    H/O hiatal hernia    Hypertension    Pneumonia    Wears glasses     Assessment/Plan: 1 Day Post-Op Procedure(s) (LRB): ARTHROPLASTY, HIP,  TOTAL, ANTERIOR APPROACH (Right) Principal Problem:   OA (osteoarthritis) of hip Active Problems:   Osteoarthritis of right hip  Estimated body mass index is 25.25 kg/m as calculated from the following:   Height as of this encounter: 5\' 9"  (1.753 m).   Weight as of this encounter: 77.6 kg. Advance diet Up with therapy D/C IV fluids  DVT Prophylaxis - Aspirin Weight bearing as tolerated.  Continue physical therapy. Expected discharge home today pending progress with physical therapy and if meeting patient goals. Will do HEP once discharged. Follow-up in clinic in 2 weeks.  The PDMP database was reviewed today prior to any opioid medications being prescribed to this patient.  R. Arcola Jansky, PA-C Orthopedic Surgery 12/07/2023, 7:36 AM

## 2023-12-11 NOTE — Discharge Summary (Signed)
 Physician Discharge Summary   Patient ID: MIVAAN CORBITT MRN: [id number] DOB/AGE: 12/08/1940 83 y.o.  Admit date: 12/06/2023 Discharge date: 12/07/2023  Primary Diagnosis: Osteoarthritis of the right hip   Admission Diagnoses:  Past Medical History:  Diagnosis Date   Arthritis    Cancer (HCC)    basal cell to forehead   Chest pain 10/02/2015   Chronic kidney disease    hx kidney stones   GERD (gastroesophageal reflux disease)    H/O hiatal hernia    Hypertension    Pneumonia    Wears glasses    Discharge Diagnoses:   Principal Problem:   OA (osteoarthritis) of hip Active Problems:   Osteoarthritis of right hip  Estimated body mass index is 25.25 kg/m as calculated from the following:   Height as of this encounter: 5\' 9"  (1.753 m).   Weight as of this encounter: 77.6 kg.  Procedure:  Procedure(s) (LRB): ARTHROPLASTY, HIP, TOTAL, ANTERIOR APPROACH (Right)   Consults: None  HPI: Frank Johnston is a 83 y.o. male who has advanced end-stage arthritis of their Right  hip with progressively worsening pain and dysfunction.The patient has failed nonoperative management and presents for total hip arthroplasty.   Laboratory Data: Admission on 12/06/2023, Discharged on 12/07/2023  Component Date Value Ref Range Status   WBC 12/07/2023 14.8 (H)  4.0 - 10.5 K/uL Final   RBC 12/07/2023 4.61  4.22 - 5.81 MIL/uL Final   Hemoglobin 12/07/2023 13.6  13.0 - 17.0 g/dL Final   HCT 40/98/1191 40.8  39.0 - 52.0 % Final   MCV 12/07/2023 88.5  80.0 - 100.0 fL Final   MCH 12/07/2023 29.5  26.0 - 34.0 pg Final   MCHC 12/07/2023 33.3  30.0 - 36.0 g/dL Final   RDW 47/82/9562 12.8  11.5 - 15.5 % Final   Platelets 12/07/2023 209  150 - 400 K/uL Final   nRBC 12/07/2023 0.0  0.0 - 0.2 % Final   Performed at [facility], [address] [place], [place] [address]   Sodium 12/07/2023 136  135 - 145 mmol/L Final   Potassium 12/07/2023 3.9  3.5 - 5.1 mmol/L Final    Chloride 12/07/2023 105  98 - 111 mmol/L Final   CO2 12/07/2023 21 (L)  22 - 32 mmol/L Final   Glucose, Bld 12/07/2023 185 (H)  70 - 99 mg/dL Final   Glucose reference range applies only to samples taken after fasting for at least 8 hours.   BUN 12/07/2023 24 (H)  8 - 23 mg/dL Final   Creatinine, Ser 12/07/2023 1.26 (H)  0.61 - 1.24 mg/dL Final   Calcium 57/84/6962 9.2  8.9 - 10.3 mg/dL Final   GFR, Estimated 12/07/2023 57 (L)  >60 mL/min Final   Comment: (NOTE) Calculated using the CKD-EPI Creatinine Equation (2021)    Anion gap 12/07/2023 10  5 - 15 Final   Performed at [facility], [address], [place], [place] [address]  Hospital Outpatient Visit on 11/30/2023  Component Date Value Ref Range Status   MRSA, PCR 11/30/2023 NEGATIVE  NEGATIVE Final   Staphylococcus aureus 11/30/2023 POSITIVE (A)  NEGATIVE Final   Comment: (NOTE) The Xpert SA Assay (FDA approved for NASAL specimens in patients 67 years of age and older), is one component of a comprehensive surveillance program. It is not intended to diagnose infection nor to guide or monitor treatment. Performed at [facility], [address], [place], [place] [address]  Sodium 11/30/2023 137  135 - 145 mmol/L Final   Potassium 11/30/2023 3.9  3.5 - 5.1 mmol/L Final   Chloride 11/30/2023 102  98 - 111 mmol/L Final   CO2 11/30/2023 25  22 - 32 mmol/L Final   Glucose, Bld 11/30/2023 78  70 - 99 mg/dL Final   Glucose reference range applies only to samples taken after fasting for at least 8 hours.   BUN 11/30/2023 31 (H)  8 - 23 mg/dL Final   Creatinine, Ser 11/30/2023 1.24  0.61 - 1.24 mg/dL Final   Calcium 47/82/9562 10.0  8.9 - 10.3 mg/dL Final   GFR, Estimated 11/30/2023 58 (L)  >60 mL/min Final   Comment: (NOTE) Calculated using the CKD-EPI Creatinine Equation (2021)    Anion gap 11/30/2023 10  5 - 15 Final   Performed at [facility], [address],  [place], [place] [address]   WBC 11/30/2023 9.6  4.0 - 10.5 K/uL Final   RBC 11/30/2023 5.08  4.22 - 5.81 MIL/uL Final   Hemoglobin 11/30/2023 15.0  13.0 - 17.0 g/dL Final   HCT 57/84/6962 44.7  39.0 - 52.0 % Final   MCV 11/30/2023 88.0  80.0 - 100.0 fL Final   MCH 11/30/2023 29.5  26.0 - 34.0 pg Final   MCHC 11/30/2023 33.6  30.0 - 36.0 g/dL Final   RDW 95/28/4132 12.9  11.5 - 15.5 % Final   Platelets 11/30/2023 256  150 - 400 K/uL Final   nRBC 11/30/2023 0.0  0.0 - 0.2 % Final   Performed at [facility], [address], [place], [place] [address]   ABO/RH(D) 11/30/2023 A POS   Final   Antibody Screen 11/30/2023 NEG   Final   Sample Expiration 11/30/2023 12/09/2023,2359   Final   Extend sample reason 11/30/2023    Final                   Value:NO TRANSFUSIONS OR PREGNANCY IN THE PAST 3 MONTHS Performed at [facility], [address], [place], [place] [address]      X-Rays:DG Pelvis Portable Result Date: 12/06/2023 CLINICAL DATA:  Status post right hip arthroplasty. EXAM: PORTABLE PELVIS 1-2 VIEWS COMPARISON:  None Available. FINDINGS: Right hip arthroplasty in expected alignment. No periprosthetic lucency or fracture. Recent postsurgical change includes air and edema in the soft tissues. Presumed ice pack overlies the operative bed. IMPRESSION: Right hip arthroplasty without immediate postoperative complication. Electronically Signed   By: Narda Rutherford M.D.   On: 12/06/2023 13:24   DG HIP UNILAT WITH PELVIS 1V RIGHT Result Date: 12/06/2023 CLINICAL DATA:  Elective surgery. EXAM: DG HIP (WITH OR WITHOUT PELVIS) 1V RIGHT COMPARISON:  None Available. FINDINGS: Two fluoroscopic spot views of the pelvis and right hip obtained in the operating room. Images during hip arthroplasty. Fluoroscopy time 7 seconds. Dose 1.0309 mGy. IMPRESSION: Intraoperative fluoroscopy during right hip arthroplasty. Electronically Signed   By: Narda Rutherford M.D.   On: 12/06/2023  12:42   DG C-Arm 1-60 Min-No Report Result Date: 12/06/2023 Fluoroscopy was utilized by the requesting physician.  No radiographic interpretation.    EKG: Orders placed or performed in visit on 11/13/23   EKG 12-Lead     Hospital Course: MARKE GOODWYN is a 83 y.o. who was admitted to [facility]. They were brought to the operating room on 12/06/2023 and underwent Procedure(s): ARTHROPLASTY, HIP, TOTAL, ANTERIOR APPROACH.  Patient tolerated the procedure well and was later  transferred to the recovery room and then to the orthopaedic floor for postoperative care. They were given PO and IV analgesics for pain control following their surgery. They were given 24 hours of postoperative antibiotics of  Anti-infectives (From admission, onward)    Start     Dose/Rate Route Frequency Ordered Stop   12/06/23 1700  ceFAZolin (ANCEF) IVPB 2g/100 mL premix        2 g 200 mL/hr over 30 Minutes Intravenous Every 6 hours 12/06/23 1639 12/06/23 2258   12/06/23 0845  ceFAZolin (ANCEF) IVPB 2g/100 mL premix        2 g 200 mL/hr over 30 Minutes Intravenous On call to O.R. 12/06/23 0831 12/06/23 1121      and started on DVT prophylaxis in the form of Aspirin.   PT and OT were ordered for total joint protocol. Discharge planning consulted to help with postop disposition and equipment needs.  Patient had a good night on the evening of surgery. They started to get up OOB with therapy on POD #0. Pt was seen during rounds and was ready to go home pending progress with therapy. He worked with therapy on POD #1 and was meeting his goals. Pt was discharged to home later that day in stable condition.  Diet: Regular diet Activity: WBAT Follow-up: in 2 weeks Disposition: Home Discharged Condition: stable   Discharge Instructions     Call MD / Call 911   Complete by: As directed    If you experience chest pain or shortness of breath, CALL 911 and be transported to the hospital emergency room.  If you  develope a fever above 101 F, pus (white drainage) or increased drainage or redness at the wound, or calf pain, call your surgeon's office.   Change dressing   Complete by: As directed    You have an adhesive waterproof bandage over the incision. Leave this in place until your first follow-up appointment. Once you remove this you will not need to place another bandage.   Constipation Prevention   Complete by: As directed    Drink plenty of fluids.  Prune juice may be helpful.  You may use a stool softener, such as Colace (over the counter) 100 mg twice a day.  Use MiraLax (over the counter) for constipation as needed.   Diet - low sodium heart healthy   Complete by: As directed    Do not sit on low chairs, stoools or toilet seats, as it may be difficult to get up from low surfaces   Complete by: As directed    Driving restrictions   Complete by: As directed    No driving for two weeks   Post-operative opioid taper instructions:   Complete by: As directed    POST-OPERATIVE OPIOID TAPER INSTRUCTIONS: It is important to wean off of your opioid medication as soon as possible. If you do not need pain medication after your surgery it is ok to stop day one. Opioids include: Codeine, Hydrocodone(Norco, Vicodin), Oxycodone(Percocet, oxycontin) and hydromorphone amongst others.  Long term and even short term use of opiods can cause: Increased pain response Dependence Constipation Depression Respiratory depression And more.  Withdrawal symptoms can include Flu like symptoms Nausea, vomiting And more Techniques to manage these symptoms Hydrate well Eat regular healthy meals Stay active Use relaxation techniques(deep breathing, meditating, yoga) Do Not substitute Alcohol to help with tapering If you have been on opioids for less than two weeks and do not have pain than it is  ok to stop all together.  Plan to wean off of opioids This plan should start within one week post op of your joint  replacement. Maintain the same interval or time between taking each dose and first decrease the dose.  Cut the total daily intake of opioids by one tablet each day Next start to increase the time between doses. The last dose that should be eliminated is the evening dose.      TED hose   Complete by: As directed    Use stockings (TED hose) for three weeks on both leg(s).  You may remove them at night for sleeping.   Weight bearing as tolerated   Complete by: As directed       Allergies as of 12/07/2023       Reactions   Amlodipine Swelling   No Known Allergies         Medication List     STOP taking these medications    aspirin 81 MG tablet Replaced by: Aspirin Low Dose 81 MG chewable tablet   HYDROcodone-acetaminophen 5-325 MG tablet Commonly known as: NORCO/VICODIN   naproxen sodium 220 MG tablet Commonly known as: ALEVE       TAKE these medications    acetaminophen 500 MG tablet Commonly known as: TYLENOL Take 500-1,000 mg by mouth every 8 (eight) hours as needed for moderate pain (pain score 4-6) or headache.   Aspirin Low Dose 81 MG chewable tablet Generic drug: aspirin Chew 1 tablet (81 mg total) by mouth 2 (two) times daily. Then resume an 81 mg aspirin once a day. Replaces: aspirin 81 MG tablet   chlorhexidine 4 % external liquid Commonly known as: HIBICLENS Apply 15 mLs (1 Application total) topically as directed for 30 doses. Use as directed daily for 5 days every other week for 6 weeks.   fluticasone 50 MCG/ACT nasal spray Commonly known as: FLONASE Place 2 sprays into the nose at bedtime.   hydrALAZINE 50 MG tablet Commonly known as: APRESOLINE Take 1 tablet (50 mg total) by mouth 2 (two) times daily. May also take 1 tablet (50 mg total) daily as needed (for systolic blood pressure above 161).   methocarbamol 500 MG tablet Commonly known as: ROBAXIN Take 1 tablet (500 mg total) by mouth every 6 (six) hours as needed for muscle spasms. What  changed: reasons to take this   metoCLOPramide 5 MG tablet Commonly known as: REGLAN Take 5 mg by mouth at bedtime.   mupirocin ointment 2 % Commonly known as: BACTROBAN Place 1 Application into the nose 2 (two) times daily for 60 doses. Use as directed 2 times daily for 5 days every other week for 6 weeks.   omeprazole 40 MG capsule Commonly known as: PRILOSEC Take 40 mg by mouth every evening.   ondansetron 4 MG tablet Commonly known as: ZOFRAN Take 1 tablet (4 mg total) by mouth every 6 (six) hours as needed for nausea.   oxyCODONE 5 MG immediate release tablet Commonly known as: Oxy IR/ROXICODONE Take 1-2 tablets (5-10 mg total) by mouth every 6 (six) hours as needed for severe pain (pain score 7-10).   PROBIOTIC ADVANCED PO Take 1 capsule by mouth at bedtime.   Senna-S 8.6-50 MG tablet Generic drug: senna-docusate Take 1 tablet by mouth daily as needed for mild constipation or moderate constipation.   therapeutic multivitamin-minerals tablet Take 1 tablet by mouth daily.   traMADol 50 MG tablet Commonly known as: ULTRAM Take 1-2 tablets (50-100 mg total) by  mouth every 6 (six) hours as needed for moderate pain (pain score 4-6).   valsartan-hydrochlorothiazide 320-12.5 MG tablet Commonly known as: DIOVAN-HCT Take 1 tablet by mouth daily.               Discharge Care Instructions  (From admission, onward)           Start     Ordered   12/07/23 0000  Weight bearing as tolerated        12/07/23 0738   12/07/23 0000  Change dressing       Comments: You have an adhesive waterproof bandage over the incision. Leave this in place until your first follow-up appointment. Once you remove this you will not need to place another bandage.   12/07/23 8469            Follow-up Information     Ollen Gross, MD. Schedule an appointment as soon as possible for a visit in 2 week(s).   Specialty: Orthopedic Surgery Contact information: [address] [place] [address] [phone]                 Signed: R. Arcola Jansky, PA-C Orthopedic Surgery 12/11/2023, 7:59 AM

## 2023-12-21 DIAGNOSIS — M1711 Unilateral primary osteoarthritis, right knee: Secondary | ICD-10-CM | POA: Diagnosis not present

## 2024-01-08 ENCOUNTER — Ambulatory Visit: Payer: Medicare Other | Admitting: Podiatry

## 2024-01-12 ENCOUNTER — Encounter: Payer: Self-pay | Admitting: Podiatry

## 2024-01-12 ENCOUNTER — Ambulatory Visit: Payer: Medicare Other | Admitting: Podiatry

## 2024-01-12 VITALS — Ht 69.0 in | Wt 171.0 lb

## 2024-01-12 DIAGNOSIS — B351 Tinea unguium: Secondary | ICD-10-CM | POA: Diagnosis not present

## 2024-01-12 DIAGNOSIS — M79674 Pain in right toe(s): Secondary | ICD-10-CM | POA: Diagnosis not present

## 2024-01-12 DIAGNOSIS — M79675 Pain in left toe(s): Secondary | ICD-10-CM | POA: Diagnosis not present

## 2024-01-16 DIAGNOSIS — M1711 Unilateral primary osteoarthritis, right knee: Secondary | ICD-10-CM | POA: Diagnosis not present

## 2024-01-16 DIAGNOSIS — Z5189 Encounter for other specified aftercare: Secondary | ICD-10-CM | POA: Diagnosis not present

## 2024-01-18 ENCOUNTER — Encounter: Payer: Self-pay | Admitting: Podiatry

## 2024-01-18 NOTE — Progress Notes (Signed)
  Subjective:  Patient ID: Frank Johnston, male    DOB: 08-23-1941,  MRN: [id number]  83 y.o. male presents painful, elongated thickened toenails x 10 which are symptomatic when wearing enclosed shoe gear. This interferes with his/her daily activities. He is recovering from right hip surgery. Chief Complaint  Patient presents with   Nail Problem    Pt is here for [facility] PCP is Dr Efraim Grange and LOV was in the fall.   New problem(s): None   PCP is Bertha Broad, MD , and last visit was June 22, 2023.  Allergies  Allergen Reactions   Amlodipine Swelling   No Known Allergies     Review of Systems: Negative except as noted in the HPI.   Objective:  Frank Johnston is a pleasant 83 y.o. male WD, WN in NAD. AAO x 3.  Vascular Examination: Vascular status intact b/l with palpable pedal pulses. CFT immediate b/l. Pedal hair present. No edema. No pain with calf compression b/l. Skin temperature gradient WNL b/l. No varicosities noted. No cyanosis or clubbing noted.  Neurological Examination: Sensation grossly intact b/l with 10 gram monofilament. Vibratory sensation intact b/l.  Dermatological Examination: Pedal skin with normal turgor, texture and tone b/l. No open wounds nor interdigital macerations noted. Toenails 1-5 b/l thick, discolored, elongated with subungual debris and pain on dorsal palpation. No hyperkeratotic lesions noted b/l.   Musculoskeletal Examination: Muscle strength 5/5 to b/l LE.  No pain, crepitus noted b/l. No gross pedal deformities. Utilizes cane for ambulation assistance.  Radiographs: None  Last A1c:       No data to display         Assessment:   1. Pain due to onychomycosis of toenails of both feet    Plan:  Patient was evaluated and treated. All patient's and/or POA's questions/concerns addressed on today's visit. Toenails 1-5 debrided in length and girth without incident. Continue soft, supportive shoe gear daily. Report any pedal  injuries to medical professional. Call office if there are any questions/concerns. -Patient/POA to call should there be question/concern in the interim.  Return in about 3 months (around 04/12/2024).  Luella Sager, DPM       LOCATION: [address], [place] [address]                   Office [phone]   [facility] LOCATION: [address] [place], [place] [address] Office [phone]

## 2024-01-23 DIAGNOSIS — K080 Exfoliation of teeth due to systemic causes: Secondary | ICD-10-CM | POA: Diagnosis not present

## 2024-02-22 DIAGNOSIS — H26493 Other secondary cataract, bilateral: Secondary | ICD-10-CM | POA: Diagnosis not present

## 2024-02-22 DIAGNOSIS — H40013 Open angle with borderline findings, low risk, bilateral: Secondary | ICD-10-CM | POA: Diagnosis not present

## 2024-02-22 DIAGNOSIS — H35371 Puckering of macula, right eye: Secondary | ICD-10-CM | POA: Diagnosis not present

## 2024-02-22 DIAGNOSIS — H532 Diplopia: Secondary | ICD-10-CM | POA: Diagnosis not present

## 2024-02-29 DIAGNOSIS — M1711 Unilateral primary osteoarthritis, right knee: Secondary | ICD-10-CM | POA: Diagnosis not present

## 2024-03-07 DIAGNOSIS — M1711 Unilateral primary osteoarthritis, right knee: Secondary | ICD-10-CM | POA: Diagnosis not present

## 2024-03-11 ENCOUNTER — Encounter: Payer: Self-pay | Admitting: Podiatry

## 2024-03-11 ENCOUNTER — Ambulatory Visit: Admitting: Podiatry

## 2024-03-11 DIAGNOSIS — M79674 Pain in right toe(s): Secondary | ICD-10-CM

## 2024-03-11 DIAGNOSIS — L02612 Cutaneous abscess of left foot: Secondary | ICD-10-CM | POA: Diagnosis not present

## 2024-03-11 DIAGNOSIS — L600 Ingrowing nail: Secondary | ICD-10-CM | POA: Diagnosis not present

## 2024-03-11 DIAGNOSIS — L03032 Cellulitis of left toe: Secondary | ICD-10-CM | POA: Diagnosis not present

## 2024-03-11 MED ORDER — DOXYCYCLINE HYCLATE 100 MG PO CAPS: 100.0000 mg | ORAL_CAPSULE | Freq: Two times a day (BID) | ORAL | 0 refills | Status: AC

## 2024-03-11 NOTE — Patient Instructions (Addendum)

## 2024-03-12 NOTE — Progress Notes (Signed)
  Subjective:  Patient ID: Frank Johnston, male    DOB: 03-28-1941,  MRN: [id number]  Chief Complaint  Patient presents with   Nail Problem    Thick painful toenails, 9 week follow up    83 y.o. male presents with the above complaint. History confirmed with patient.  He is here today to see Dr. Denece Finger for his regular nail care debridements of this has been helpful on a regular basis, he has a new issue of an ingrown toenail he tried to get out over the weekend it has been red and swollen and painful  Objective:  Physical Exam: warm, good capillary refill, no trophic changes or ulcerative lesions, normal DP and PT pulses, and normal sensory exam. Left Foot: dystrophic yellowed discolored nail plates with subungual debris and ingrown left hallux, cellulitis to the MTP joint Right Foot: dystrophic yellowed discolored nail plates with subungual debris   Assessment:   1. Cellulitis and abscess of toe of left foot   2. Ingrowing left great toenail   3. Pain due to onychomycosis of toenails of both feet      Plan:  Patient was evaluated and treated and all questions answered.  Previous nail debridement has been helpful for him. Sharp and mechanical debridement performed of all painful and mycotic nails today. Nails debrided in length and thickness using a nail nipper to level of comfort today by Dr. Denece Finger    Ingrown Nail, left -Patient elects to proceed with minor surgery to remove ingrown toenail today. Consent reviewed and signed by patient. -Ingrown nail excised. See procedure note. -Educated on post-procedure care including soaking. Written instructions provided and reviewed. -Rx for doxycycline sent to pharmacy for the cellulitis. -If cellulitis does not improve within 1 week he should call for reappointment  Procedure: Excision of Ingrown Toenail Location: Left 1st toe lateral nail borders. Anesthesia: Lidocaine  1% plain; 1.5 mL and Marcaine  0.5% plain; 1.5 mL, digital  block. Skin Prep: Betadine . Dressing: Silvadene; telfa; dry, sterile, compression dressing. Technique: Following skin prep, the toe was exsanguinated and a tourniquet was secured at the base of the toe. The affected nail border was freed, split with a nail splitter, and excised.  No matricectomy was performed.  The tourniquet was then removed and sterile dressing applied. Disposition: Patient tolerated procedure well.    Return in about 3 months (around 06/11/2024).

## 2024-03-14 DIAGNOSIS — M1711 Unilateral primary osteoarthritis, right knee: Secondary | ICD-10-CM | POA: Diagnosis not present

## 2024-04-11 DIAGNOSIS — M7061 Trochanteric bursitis, right hip: Secondary | ICD-10-CM | POA: Diagnosis not present

## 2024-04-22 ENCOUNTER — Ambulatory Visit: Admitting: Podiatry

## 2024-04-22 ENCOUNTER — Encounter: Payer: Self-pay | Admitting: Podiatry

## 2024-04-22 DIAGNOSIS — B351 Tinea unguium: Secondary | ICD-10-CM | POA: Diagnosis not present

## 2024-04-22 DIAGNOSIS — M79674 Pain in right toe(s): Secondary | ICD-10-CM

## 2024-04-22 DIAGNOSIS — M79675 Pain in left toe(s): Secondary | ICD-10-CM | POA: Diagnosis not present

## 2024-04-28 ENCOUNTER — Encounter: Payer: Self-pay | Admitting: Podiatry

## 2024-04-28 NOTE — Progress Notes (Signed)
  Subjective:  Patient ID: Frank Johnston, male    DOB: Jul 24, 1941,  MRN: [id number]  Frank Johnston presents to clinic today for painful thick toenails that are difficult to trim. Pain interferes with ambulation. Aggravating factors include wearing enclosed shoe gear. Pain is relieved with periodic professional debridement.  Chief Complaint  Patient presents with   RFc    Rm3 not diabetic/ Dr Yolande last visit September 2025   New problem(s): None.   PCP is Yolande Toribio MATSU, MD.  Allergies  Allergen Reactions   Amlodipine  Swelling   Lisinopril-Hydrochlorothiazide  Cough   No Known Allergies     Review of Systems: Negative except as noted in the HPI.  Objective: No changes noted in today's physical examination. There were no vitals filed for this visit. Frank Johnston is a pleasant 83 y.o. male WD, WN in NAD. AAO x 3.  Vascular Examination: Capillary refill time immediate b/l. Palpable pedal pulses. Pedal hair present b/l. Pedal edema absent. No pain with calf compression b/l. Skin temperature gradient WNL b/l. No cyanosis or clubbing b/l. No ischemia or gangrene noted b/l.   Neurological Examination: Sensation grossly intact b/l with 10 gram monofilament. Vibratory sensation intact b/l.   Dermatological Examination: Pedal skin with normal turgor, texture and tone b/l.  No open wounds. No interdigital macerations.   Toenails 1-5 b/l thick, discolored, elongated with subungual debris and pain on dorsal palpation.   No corns, calluses, nor porokeratotic lesions.  Musculoskeletal Examination: Muscle strength 5/5 to all lower extremity muscle groups bilaterally. No pain, crepitus or joint limitation noted with ROM bilateral LE. No gross bony deformities bilaterally. Utilizes cane for ambulation assistance.  Radiographs: None  Assessment/Plan: 1. Pain due to onychomycosis of toenails of both feet    Consent given for treatment. Patient examined. All patient's  and/or POA's questions/concerns addressed on today's visit. Mycotic toenails 1-5 debrided in length and girth without incident. Continue soft, supportive shoe gear daily. Report any pedal injuries to medical professional. Call office if there are any quesitons/concerns. -Patient/POA to call should there be question/concern in the interim.   Return in about 3 months (around 07/23/2024).  Frank Johnston Merlin, DPM      [place] LOCATION: [address] [place] [address]                   Office [phone]   [facility] LOCATION: [address] [place], [place] [address] Office [phone]

## 2024-04-30 DIAGNOSIS — M1711 Unilateral primary osteoarthritis, right knee: Secondary | ICD-10-CM | POA: Diagnosis not present

## 2024-05-02 ENCOUNTER — Telehealth: Payer: Self-pay

## 2024-05-02 NOTE — Telephone Encounter (Signed)
   Name: Frank Johnston  DOB: 04-28-1941  MRN: [id number]  Primary Cardiologist: Redell Shallow, MD   Preoperative team, please contact this patient and set up a phone call appointment for further preoperative risk assessment. Please obtain consent and complete medication review. Thank you for your help.  I confirm that guidance regarding antiplatelet and oral anticoagulation therapy has been completed and, if necessary, noted below.  Regarding ASA therapy, we recommend continuation of ASA throughout the perioperative period. However, if the surgeon feels that cessation of ASA is required in the perioperative period, it may be stopped 5-7 days prior to surgery with a plan to resume it as soon as felt to be feasible from a surgical standpoint in the post-operative period.    I also confirmed the patient resides in the state of [place] . As per [facility] Medical Board telemedicine laws, the patient must reside in the state in which the provider is licensed.   Frank CHRISTELLA Beauvais, NP 05/02/2024, 1:23 PM [facility]

## 2024-05-02 NOTE — Telephone Encounter (Signed)
Patient has been scheduled for televisit.

## 2024-05-02 NOTE — Telephone Encounter (Signed)
   Pre-operative Risk Assessment    Patient Name: Frank Johnston  DOB: Nov 19, 1940 MRN: [id number]   Date of last office visit: 11/13/23 [name], NP Date of next office visit: NONE   Request for Surgical Clearance    Procedure:  RIGHT TOTAL KNEE ARTHROPLASTY  Date of Surgery:  Clearance 07/29/24                                Surgeon:  DR DEMPSEY MOAN Surgeon's Group or Practice Name:  [facility] Phone number:  [phone] Fax number:  [phone]  ATTN: KERRI MAZE   Type of Clearance Requested:   - Medical  - Pharmacy:  Hold Aspirin      Type of Anesthesia:  CHOICE   Additional requests/questions:    SignedLucie DELENA Ku   05/02/2024, 11:50 AM

## 2024-05-02 NOTE — Telephone Encounter (Signed)
 Patient has been scheduled for televisit and consent is done     Patient Consent for Virtual Visit         Frank Johnston has provided verbal consent on 05/02/2024 for a virtual visit (video or telephone).   CONSENT FOR VIRTUAL VISIT FOR:  Frank Johnston  By participating in this virtual visit I agree to the following:  I hereby voluntarily request, consent and authorize [facility] and its employed or contracted physicians, physician assistants, nurse practitioners or other licensed health care professionals (the Practitioner), to provide me with telemedicine health care services (the "Services) as deemed necessary by the treating Practitioner. I acknowledge and consent to receive the Services by the Practitioner via telemedicine. I understand that the telemedicine visit will involve communicating with the Practitioner through live audiovisual communication technology and the disclosure of certain medical information by electronic transmission. I acknowledge that I have been given the opportunity to request an in-person assessment or other available alternative prior to the telemedicine visit and am voluntarily participating in the telemedicine visit.  I understand that I have the right to withhold or withdraw my consent to the use of telemedicine in the course of my care at any time, without affecting my right to future care or treatment, and that the Practitioner or I may terminate the telemedicine visit at any time. I understand that I have the right to inspect all information obtained and/or recorded in the course of the telemedicine visit and may receive copies of available information for a reasonable fee.  I understand that some of the potential risks of receiving the Services via telemedicine include:  Delay or interruption in medical evaluation due to technological equipment failure or disruption; Information transmitted may not be sufficient (e.g. poor resolution of  images) to allow for appropriate medical decision making by the Practitioner; and/or  In rare instances, security protocols could fail, causing a breach of personal health information.  Furthermore, I acknowledge that it is my responsibility to provide information about my medical history, conditions and care that is complete and accurate to the best of my ability. I acknowledge that Practitioner's advice, recommendations, and/or decision may be based on factors not within their control, such as incomplete or inaccurate data provided by me or distortions of diagnostic images or specimens that may result from electronic transmissions. I understand that the practice of medicine is not an exact science and that Practitioner makes no warranties or guarantees regarding treatment outcomes. I acknowledge that a copy of this consent can be made available to me via my patient portal [name] MyChart), or I can request a printed copy by calling the office of  [facility].    I understand that my insurance will be billed for this visit.   I have read or had this consent read to me. I understand the contents of this consent, which adequately explains the benefits and risks of the Services being provided via telemedicine.  I have been provided ample opportunity to ask questions regarding this consent and the Services and have had my questions answered to my satisfaction. I give my informed consent for the services to be provided through the use of telemedicine in my medical care

## 2024-05-14 DIAGNOSIS — L570 Actinic keratosis: Secondary | ICD-10-CM | POA: Diagnosis not present

## 2024-05-14 DIAGNOSIS — L578 Other skin changes due to chronic exposure to nonionizing radiation: Secondary | ICD-10-CM | POA: Diagnosis not present

## 2024-05-14 DIAGNOSIS — Z85828 Personal history of other malignant neoplasm of skin: Secondary | ICD-10-CM | POA: Diagnosis not present

## 2024-05-14 DIAGNOSIS — D692 Other nonthrombocytopenic purpura: Secondary | ICD-10-CM | POA: Diagnosis not present

## 2024-05-27 DIAGNOSIS — M1711 Unilateral primary osteoarthritis, right knee: Secondary | ICD-10-CM | POA: Diagnosis not present

## 2024-05-28 DIAGNOSIS — G609 Hereditary and idiopathic neuropathy, unspecified: Secondary | ICD-10-CM | POA: Diagnosis not present

## 2024-05-28 DIAGNOSIS — I10 Essential (primary) hypertension: Secondary | ICD-10-CM | POA: Diagnosis not present

## 2024-05-30 DIAGNOSIS — K080 Exfoliation of teeth due to systemic causes: Secondary | ICD-10-CM | POA: Diagnosis not present

## 2024-06-25 DIAGNOSIS — M1711 Unilateral primary osteoarthritis, right knee: Secondary | ICD-10-CM | POA: Diagnosis not present

## 2024-06-25 DIAGNOSIS — M25561 Pain in right knee: Secondary | ICD-10-CM | POA: Diagnosis not present

## 2024-06-27 NOTE — H&P (Signed)
 TOTAL KNEE ADMISSION H&P  Patient is being admitted for right total knee arthroplasty.  Subjective:  Chief Complaint: Right knee pain.  HPI: Frank Johnston, 83 y.o. male has a history of pain and functional disability in the right knee due to arthritis and has failed non-surgical conservative treatments for greater than 12 weeks to include NSAID's and/or analgesics, corticosteriod injections, and activity modification. Onset of symptoms was gradual, starting several years ago with gradually worsening course since that time. The patient noted no past surgery on the right knee.  Patient currently rates pain in the right knee at 8 out of 10 with activity. Patient has night pain, worsening of pain with activity and weight bearing, pain with passive range of motion, and crepitus. Patient has evidence of severe bone-on-bone arthritis in the medial and patellofemoral compartments, tibial subluxation, and varus deformity by imaging studies.There is no active infection.  Patient Active Problem List   Diagnosis Date Noted   OA (osteoarthritis) of hip 12/06/2023   Osteoarthritis of right hip 12/06/2023   Arthritis of left hand 10/12/2022   Surgery follow-up examination 01/18/2021   Pain due to onychomycosis of toenails of both feet 04/04/2019   History of adenomatous polyp of colon 01/08/2018   Preretinal fibrosis, right eye 09/06/2016   Dyspnea 02/04/2016   Chest pain 10/02/2015   DVT (deep venous thrombosis) (HCC) 03/02/2014   Pulmonary embolism on left 02/28/2014   Knee pain, right 02/28/2014   Pulmonary embolism (HCC) 02/28/2014   Essential hypertension 01/19/2008   DYSPEPSIA 01/19/2008   ILEUS 01/19/2008   RENAL CALCULUS 01/19/2008   ARTHRITIS 01/19/2008   COLONIC POLYPS 08/01/2005   INTERNAL HEMORRHOIDS 08/01/2005    Past Medical History:  Diagnosis Date   Arthritis    Cancer (HCC)    basal cell to forehead   Chest pain 10/02/2015   Chronic kidney disease    hx kidney stones    GERD (gastroesophageal reflux disease)    H/O hiatal hernia    Hypertension    Pneumonia    Wears glasses     Past Surgical History:  Procedure Laterality Date   25 GAUGE PARS PLANA VITRECTOMY WITH 20 GAUGE MVR PORT Right 09/06/2016   Procedure: 25 GAUGE PARS PLANA VITRECTOMY WITH 20 GAUGE MVR PORT;  Surgeon: Norleen JONETTA Ku, MD;  Location: [facility] OR;  Service: Ophthalmology;  Laterality: Right;   APPENDECTOMY  1976   BACK SURGERY  1984   lumbar lam   CARPAL TUNNEL RELEASE  12/06/2011   Procedure: CARPAL TUNNEL RELEASE;  Surgeon: Arley JONELLE Curia, MD;  Location: [facility];  Service: Orthopedics;  Laterality: Right;   CARPAL TUNNEL RELEASE  08/07/2012   Procedure: CARPAL TUNNEL RELEASE;  Surgeon: Arley JONELLE Curia, MD;  Location: [facility];  Service: Orthopedics;  Laterality: Left;  carpal tunnel release   CHOLECYSTECTOMY  1976   COLONOSCOPY     JOINT REPLACEMENT  4/12   lt total knee   KNEE ARTHROSCOPY     rt and lt   KNEE CLOSED REDUCTION  6/12   lt -after lt total knee   TOTAL HIP ARTHROPLASTY Right 12/06/2023   Procedure: ARTHROPLASTY, HIP, TOTAL, ANTERIOR APPROACH;  Surgeon: Melodi Lerner, MD;  Location: [facility];  Service: Orthopedics;  Laterality: Right;   TRIGGER FINGER RELEASE  12/06/2011   Procedure: RELEASE TRIGGER FINGER/A-1 PULLEY;  Surgeon: Arley JONELLE Curia, MD;  Location: [facility];  Service: Orthopedics;  Laterality: Right;  right thumb, middle, and  ring fingers   TRIGGER FINGER RELEASE  08/07/2012   Procedure: RELEASE TRIGGER FINGER/A-1 PULLEY;  Surgeon: Arley JONELLE Curia, MD;  Location: [facility];  Service: Orthopedics;  Laterality: Left;  trigger release left ring finger    Prior to Admission medications   Medication Sig Start Date End Date Taking? Authorizing Provider  acetaminophen  (TYLENOL ) 500 MG tablet Take 500-1,000 mg by mouth every 8 (eight) hours as needed for moderate pain (pain score 4-6) or headache.    [provider]  aspirin  81 MG chewable tablet Chew 1 tablet (81 mg total) by mouth 2 (two) times daily. Then resume an 81 mg aspirin  once a day. 12/07/23   Kristian Stabs, PA  chlorhexidine  (HIBICLENS ) 4 % external liquid Apply 15 mLs (1 Application total) topically as directed for 30 doses. Use as directed daily for 5 days every other week for 6 weeks. 12/06/23   Kristian Stabs, PA  fluticasone  (FLONASE ) 50 MCG/ACT nasal spray Place 2 sprays into the nose at bedtime.    [provider]  hydrALAZINE  (APRESOLINE ) 50 MG tablet Take 1 tablet (50 mg total) by mouth 2 (two) times daily. May also take 1 tablet (50 mg total) daily as needed (for systolic blood pressure above 849). 02/10/20   Raford Riggs, MD  methocarbamol  (ROBAXIN ) 500 MG tablet Take 1 tablet (500 mg total) by mouth every 6 (six) hours as needed for muscle spasms. 12/07/23   Kristian Stabs, PA  metoCLOPramide  (REGLAN ) 5 MG tablet Take 5 mg by mouth at bedtime.     [provider]  omeprazole (PRILOSEC) 40 MG capsule Take 40 mg by mouth every evening.    [provider]  ondansetron  (ZOFRAN ) 4 MG tablet Take 1 tablet (4 mg total) by mouth every 6 (six) hours as needed for nausea. 12/07/23   Kristian Stabs, PA  oxyCODONE  (OXY IR/ROXICODONE ) 5 MG immediate release tablet Take 1-2 tablets (5-10 mg total) by mouth every 6 (six) hours as needed for severe pain (pain score 7-10). 12/07/23   Kristian Stabs, PA  Probiotic Product (PROBIOTIC ADVANCED PO) Take 1 capsule by mouth at bedtime.     [provider]  SENNA-S 8.6-50 MG tablet Take 1 tablet by mouth daily as needed for mild constipation or moderate constipation. 09/03/19   [provider]  therapeutic multivitamin-minerals (THERAGRAN-M) tablet Take 1 tablet by mouth daily.    [provider]  traMADol  (ULTRAM ) 50 MG tablet Take 1-2 tablets (50-100 mg total) by mouth every 6 (six) hours as needed for moderate pain (pain score 4-6).  12/07/23   Kristian Stabs, PA  valsartan -hydrochlorothiazide  (DIOVAN -HCT) 320-12.5 MG tablet Take 1 tablet by mouth daily. 08/11/20   [provider]    Allergies  Allergen Reactions   Amlodipine  Swelling   Lisinopril-Hydrochlorothiazide  Cough   No Known Allergies     Social History   Socioeconomic History   Marital status: Married    Spouse name: Not on file   Number of children: Not on file   Years of education: Not on file   Highest education level: Not on file  Occupational History   Occupation: retired  Tobacco Use   Smoking status: Former    Current packs/day: 0.00    Average packs/day: 1 pack/day for 40.0 years (40.0 ttl pk-yrs)    Types: Cigarettes    Start date: 11/30/1954    Quit date: 11/30/1994    Years since quitting: 29.5   Smokeless tobacco: Never  Vaping Use  Vaping status: Never Used  Substance and Sexual Activity   Alcohol  use: No    Alcohol /week: 0.0 standard drinks of alcohol    Drug use: No   Sexual activity: Not on file  Other Topics Concern   Not on file  Social History Narrative   Epworth Sleepiness Scale = 7 (as of 10/02/2015)   Social Drivers of Health   Financial Resource Strain: Not on file  Food Insecurity: No Food Insecurity (12/06/2023)   Hunger Vital Sign    Worried About Running Out of Food in the Last Year: Never true    Ran Out of Food in the Last Year: Never true  Transportation Needs: No Transportation Needs (12/06/2023)   PRAPARE - Administrator, Civil Service (Medical): No    Lack of Transportation (Non-Medical): No  Physical Activity: Not on file  Stress: Not on file  Social Connections: Unknown (12/06/2023)   Social Connection and Isolation Panel    Frequency of Communication with Friends and Family: Patient declined    Frequency of Social Gatherings with Friends and Family: Patient declined    Attends Religious Services: Patient declined    Database administrator or Organizations: Patient declined     Attends Banker Meetings: Patient declined    Marital Status: Married  Catering manager Violence: Not At Risk (12/06/2023)   Humiliation, Afraid, Rape, and Kick questionnaire    Fear of Current or Ex-Partner: No    Emotionally Abused: No    Physically Abused: No    Sexually Abused: No    Tobacco Use: Medium Risk (04/28/2024)   Patient History    Smoking Tobacco Use: Former    Smokeless Tobacco Use: Never    Passive Exposure: Not on file   Social History   Substance and Sexual Activity  Alcohol  Use No   Alcohol /week: 0.0 standard drinks of alcohol     Family History  Problem Relation Age of Onset   Stroke Mother    Cancer Father    Hypertension Child    Hypertension Child     Review of Systems  Constitutional:  Negative for chills and fever.  HENT:  Negative for congestion, sore throat and tinnitus.   Eyes:  Negative for double vision, photophobia and pain.  Respiratory:  Negative for cough, shortness of breath and wheezing.   Cardiovascular:  Negative for chest pain, palpitations and orthopnea.  Gastrointestinal:  Negative for heartburn, nausea and vomiting.  Genitourinary:  Negative for dysuria, frequency and urgency.  Musculoskeletal:  Positive for joint pain.  Neurological:  Negative for dizziness, weakness and headaches.    Objective:  Physical Exam: Well nourished and well developed.  General: Alert and oriented x3, cooperative and pleasant, no acute distress.  Head: normocephalic, atraumatic, neck supple.  Eyes: EOMI.  Musculoskeletal:  Right Knee: No effusion. Range of motion approximately 5-110 degrees with significant varus deformity. Tender medially, no lateral tenderness, and no instability noted.  Calves soft and nontender. Motor function intact in LE. Strength 5/5 LE bilaterally. Neuro: Distal pulses 2+. Sensation to light touch intact in LE.   Imaging Review Plain radiographs demonstrate severe degenerative joint disease of the right  knee. The overall alignment is neutral. The bone quality appears to be adequate for age and reported activity level.  Assessment/Plan:  End stage arthritis, right knee   The patient history, physical examination, clinical judgment of the provider and imaging studies are consistent with end stage degenerative joint disease of the right knee and total  knee arthroplasty is deemed medically necessary. The treatment options including medical management, injection therapy arthroscopy and arthroplasty were discussed at length. The risks and benefits of total knee arthroplasty were presented and reviewed. The risks due to aseptic loosening, infection, stiffness, patella tracking problems, thromboembolic complications and other imponderables were discussed. The patient acknowledged the explanation, agreed to proceed with the plan and consent was signed. Patient is being admitted for inpatient treatment for surgery, pain control, PT, OT, prophylactic antibiotics, VTE prophylaxis, progressive ambulation and ADLs and discharge planning. The patient is planning to be discharged home.   Patient's anticipated LOS is less than 2 midnights, meeting these requirements: - Lives within 1 hour of care - Has a competent adult at home to recover with post-op recover - NO history of  - Chronic pain requiring opiods  - Diabetes  - Coronary Artery Disease  - Heart failure  - Heart attack  - Stroke  - Cardiac arrhythmia  - Respiratory Failure/COPD  - Renal failure  - Anemia  - Advanced Liver disease  Therapy Plans: Outpatient therapy at [facility] Disposition: Home with wife Planned DVT Prophylaxis: Xarelto  10 mg QD DME Needed: None PCP: Toribio Gander, MD (appt 10/1) Cardiologist: Redell Shallow, MD (televisit 10/6) TXA: Topical Allergies: Amlodipine , lisinopril-HCTZ Metal Allergy: None Anesthesia Concerns: None BMI: 28.9 Last HgbA1c: Not diabetic Pain Regimen: Oxycodone , tramadol  [facility]  Other: - Hx DVT/PE 2015   - Patient was instructed on what medications to stop prior to surgery. - Follow-up visit in 2 weeks with Dr. Melodi - Begin physical therapy following surgery - Pre-operative lab work as pre-surgical testing - Prescriptions will be provided in hospital at time of discharge  Roxie Mess, PA-C Orthopedic Surgery [facility] [place]

## 2024-07-02 DIAGNOSIS — I10 Essential (primary) hypertension: Secondary | ICD-10-CM | POA: Diagnosis not present

## 2024-07-02 DIAGNOSIS — Z125 Encounter for screening for malignant neoplasm of prostate: Secondary | ICD-10-CM | POA: Diagnosis not present

## 2024-07-08 ENCOUNTER — Ambulatory Visit: Attending: Cardiology | Admitting: Student

## 2024-07-08 DIAGNOSIS — Z01810 Encounter for preprocedural cardiovascular examination: Secondary | ICD-10-CM | POA: Diagnosis not present

## 2024-07-08 NOTE — Progress Notes (Signed)
 Virtual Visit via Telephone Note   Because of Frank Johnston's co-morbid illnesses, he is at least at moderate risk for complications without adequate follow up.  This format is felt to be most appropriate for this patient at this time.  The patient did not have access to video technology/had technical difficulties with video requiring transitioning to audio format only (telephone).  All issues noted in this document were discussed and addressed.  No physical exam could be performed with this format.  Please refer to the patient's chart for his consent to telehealth for Frank Johnston.  Evaluation Performed:  Preoperative cardiovascular risk assessment _____________   Date:  07/08/2024   Patient ID:  Frank Johnston, DOB March 30, 1941, MRN [id number] Patient Location:  Home Provider location:   Office  Primary Care Provider:  Yolande Toribio MATSU, MD Primary Cardiologist:  Frank Shallow, MD  Chief Complaint / Patient Profile   83 y.o. y/o male with a h/o hypertension, PE/DVT, CKD stage IIIa who is pending right total knee arthroplasty by Dr. Melodi on 07/29/2024 and presents today for telephonic preoperative cardiovascular risk assessment.  History of Present Illness    Frank Johnston is a 83 y.o. male who presents via audio/video conferencing for a telehealth visit today.  Pt was last seen in cardiology clinic on 11/13/2023 by Frank Louis, NP.  At that time Frank Johnston was stable from a cardiac standpoint.  The patient is now pending procedure as outlined above. Since his last visit, he is doing well. Patient denies shortness of breath, dyspnea on exertion, lower extremity edema, orthopnea or PND. No chest pain, pressure, or tightness. No palpitations. No lightheadedness, dizziness, presyncope or syncope. He is active walking as much as knee pain will allow and performing PT exercises at home.   Past Medical History    Past Medical History:  Diagnosis Date    Arthritis    Cancer (HCC)    basal cell to forehead   Chest pain 10/02/2015   Chronic kidney disease    hx kidney stones   GERD (gastroesophageal reflux disease)    H/O hiatal hernia    Hypertension    Pneumonia    Wears glasses    Past Surgical History:  Procedure Laterality Date   25 GAUGE PARS PLANA VITRECTOMY WITH 20 GAUGE MVR PORT Right 09/06/2016   Procedure: 25 GAUGE PARS PLANA VITRECTOMY WITH 20 GAUGE MVR PORT;  Surgeon: Frank JONETTA Ku, MD;  Location: [facility] OR;  Service: Ophthalmology;  Laterality: Right;   APPENDECTOMY  1976   BACK SURGERY  1984   lumbar lam   CARPAL TUNNEL RELEASE  12/06/2011   Procedure: CARPAL TUNNEL RELEASE;  Surgeon: Frank JONELLE Curia, MD;  Location: [facility];  Service: Orthopedics;  Laterality: Right;   CARPAL TUNNEL RELEASE  08/07/2012   Procedure: CARPAL TUNNEL RELEASE;  Surgeon: Frank JONELLE Curia, MD;  Location: [facility];  Service: Orthopedics;  Laterality: Left;  carpal tunnel release   CHOLECYSTECTOMY  1976   COLONOSCOPY     JOINT REPLACEMENT  4/12   lt total knee   KNEE ARTHROSCOPY     rt and lt   KNEE CLOSED REDUCTION  6/12   lt -after lt total knee   TOTAL HIP ARTHROPLASTY Right 12/06/2023   Procedure: ARTHROPLASTY, HIP, TOTAL, ANTERIOR APPROACH;  Surgeon: Frank Lerner, MD;  Location: [facility];  Service: Orthopedics;  Laterality: Right;   TRIGGER FINGER RELEASE  12/06/2011   Procedure: RELEASE  TRIGGER FINGER/A-1 PULLEY;  Surgeon: Frank JONELLE Curia, MD;  Location: [facility];  Service: Orthopedics;  Laterality: Right;  right thumb, middle, and ring fingers   TRIGGER FINGER RELEASE  08/07/2012   Procedure: RELEASE TRIGGER FINGER/A-1 PULLEY;  Surgeon: Frank JONELLE Curia, MD;  Location: [facility];  Service: Orthopedics;  Laterality: Left;  trigger release left ring finger    Allergies  Allergies  Allergen Reactions   Amlodipine  Swelling   Lisinopril-Hydrochlorothiazide  Cough   No Known Allergies      Home Medications    Prior to Admission medications   Medication Sig Start Date End Date Taking? Authorizing Provider  acetaminophen  (TYLENOL ) 500 MG tablet Take 500-1,000 mg by mouth every 8 (eight) hours as needed for moderate pain (pain score 4-6) or headache.    [provider]  aspirin  81 MG chewable tablet Chew 1 tablet (81 mg total) by mouth 2 (two) times daily. Then resume an 81 mg aspirin  once a day. 12/07/23   Frank Stabs, PA  chlorhexidine  (HIBICLENS ) 4 % external liquid Apply 15 mLs (1 Application total) topically as directed for 30 doses. Use as directed daily for 5 days every other week for 6 weeks. 12/06/23   Frank Stabs, PA  fluticasone  (FLONASE ) 50 MCG/ACT nasal spray Place 2 sprays into the nose at bedtime.    [provider]  hydrALAZINE  (APRESOLINE ) 50 MG tablet Take 1 tablet (50 mg total) by mouth 2 (two) times daily. May also take 1 tablet (50 mg total) daily as needed (for systolic blood pressure above 849). 02/10/20   Frank Riggs, MD  methocarbamol  (ROBAXIN ) 500 MG tablet Take 1 tablet (500 mg total) by mouth every 6 (six) hours as needed for muscle spasms. 12/07/23   Frank Stabs, PA  metoCLOPramide  (REGLAN ) 5 MG tablet Take 5 mg by mouth at bedtime.     [provider]  omeprazole (PRILOSEC) 40 MG capsule Take 40 mg by mouth every evening.    [provider]  ondansetron  (ZOFRAN ) 4 MG tablet Take 1 tablet (4 mg total) by mouth every 6 (six) hours as needed for nausea. 12/07/23   Frank Stabs, PA  oxyCODONE  (OXY IR/ROXICODONE ) 5 MG immediate release tablet Take 1-2 tablets (5-10 mg total) by mouth every 6 (six) hours as needed for severe pain (pain score 7-10). 12/07/23   Frank Stabs, PA  Probiotic Product (PROBIOTIC ADVANCED PO) Take 1 capsule by mouth at bedtime.     [provider]  SENNA-S 8.6-50 MG tablet Take 1 tablet by mouth daily as needed for mild constipation or moderate constipation. 09/03/19    [provider]  therapeutic multivitamin-minerals (THERAGRAN-M) tablet Take 1 tablet by mouth daily.    [provider]  traMADol  (ULTRAM ) 50 MG tablet Take 1-2 tablets (50-100 mg total) by mouth every 6 (six) hours as needed for moderate pain (pain score 4-6). 12/07/23   Frank Stabs, PA  valsartan -hydrochlorothiazide  (DIOVAN -HCT) 320-12.5 MG tablet Take 1 tablet by mouth daily. 08/11/20   [provider]    Physical Exam    Vital Signs:  [facility] LITT[facility]mer shared home vital signs performed at home today: BP 133/70, HR 82.  Given telephonic nature of communication, physical exam is limited. AAOx3. NAD. Normal affect.  Speech and respirations are unlabored.   Assessment & Plan    Primary Cardiologist: Frank Shallow, MD  Preoperative cardiovascular risk assessment.  Right total knee arthroplasty by Dr. Melodi on 07/29/2024  Chart reviewed as part  of pre-operative protocol coverage. According to the RCRI, patient has a 0.4% risk of MACE. Patient reports activity equivalent to 4.0 METS (walks for exercise and is performing PT exercises for knee at home).   Given past medical history and time since last visit, based on ACC/AHA guidelines, Frank Johnston would be at acceptable risk for the planned procedure without further cardiovascular testing.   Patient was advised that if he develops new symptoms prior to surgery to contact our office to arrange a follow-up appointment.  he verbalized understanding.  Ideally aspirin  should be continued without interruption, however if the bleeding risk is too great, aspirin  may be held for 5-7 days prior to surgery. Please resume aspirin  post operatively when it is felt to be safe from a bleeding standpoint.    I will route this recommendation to the requesting party via Epic fax function.  Please call with questions.  Time:   Today, I have spent 5 minutes with the patient with telehealth technology discussing  medical history, symptoms, and management plan.     Barnie Hila, NP  07/08/2024, 8:03 AM

## 2024-07-09 DIAGNOSIS — R82998 Other abnormal findings in urine: Secondary | ICD-10-CM | POA: Diagnosis not present

## 2024-07-19 NOTE — Patient Instructions (Signed)
 SURGICAL WAITING ROOM VISITATION  Patients having surgery or a procedure may have no more than 2 support people in the waiting area - these visitors may rotate.    Children under the age of 44 must have an adult with them who is not the patient.  Visitors with respiratory illnesses are discouraged from visiting and should remain at home.  If the patient needs to stay at the hospital during part of their recovery, the visitor guidelines for inpatient rooms apply. Pre-op nurse will coordinate an appropriate time for 1 support person to accompany patient in pre-op.  This support person may not rotate.    Please refer to the [facility] website for the visitor guidelines for Inpatients (after your surgery is over and you are in a regular room).       Your procedure is scheduled on: 07/29/24   Report to [facility] Main Entrance    Report to admitting at 10:10  AM   Call this number if you have problems the morning of surgery [phone]   Do not eat food :After Midnight.   After Midnight you may have the following liquids until 9:40 AM DAY OF SURGERY  Water  Non-Citrus Juices (without pulp, NO RED-Apple, White grape, White cranberry) Black Coffee (NO MILK/CREAM OR CREAMERS, sugar ok)  Clear Tea (NO MILK/CREAM OR CREAMERS, sugar ok) regular and decaf                             Plain Jell-O (NO RED)                                           Fruit ices (not with fruit pulp, NO RED)                                     Popsicles (NO RED)                                                               Sports drinks like Gatorade (NO RED)               The day of surgery:  Drink ONE (1) Pre-Surgery Clear Ensure  at 9:40 AM the morning of surgery. Drink in one sitting. Do not sip.  This drink was given to you during your hospital  pre-op appointment visit. Nothing else to drink after completing the  Pre-Surgery Clear Ensure .         Oral Hygiene is also important to reduce  your risk of infection.                                    Remember - BRUSH YOUR TEETH THE MORNING OF SURGERY WITH YOUR REGULAR TOOTHPASTE  DENTURES WILL BE REMOVED PRIOR TO SURGERY PLEASE DO NOT APPLY Poly grip OR ADHESIVES!!!   Stop all vitamins and herbal supplements 7 days before surgery.   Take these medicines the morning of surgery with A SIP OF WATER :  tylenol , nasal spray, hydralazine , metoclopramide , omeprazole, oxycodone .               Do not take Valsartan /hydrochlorothiazide (Diovan ) the morning of surgery.             You may not have any metal on your body including hair pins, jewelry, and body piercing             Do not wear make-up, lotions, powders, perfumes/cologne, or deodorant               Men may shave face and neck.   Do not bring valuables to the hospital. [facility] IS NOT             RESPONSIBLE   FOR VALUABLES.   Contacts, glasses, dentures or bridgework may not be worn into surgery.   Bring small overnight bag day of surgery.   DO NOT BRING YOUR HOME MEDICATIONS TO THE HOSPITAL. PHARMACY WILL DISPENSE MEDICATIONS LISTED ON YOUR MEDICATION LIST TO YOU DURING YOUR ADMISSION IN THE HOSPITAL!    Patients discharged on the day of surgery will not be allowed to drive home.  Someone NEEDS to stay with you for the first 24 hours after anesthesia.   Special Instructions: Bring a copy of your healthcare power of attorney and living will documents the day of surgery if you haven't scanned them before.              Please read over the following fact sheets you were given: IF YOU HAVE QUESTIONS ABOUT YOUR PRE-OP INSTRUCTIONS PLEASE CALL [phone] Frank Johnston   If you received a COVID test during your pre-op visit  it is requested that you wear a mask when out in public, stay away from anyone that may not be feeling well and notify your surgeon if you develop symptoms. If you test positive for Covid or have been in contact with anyone that has tested positive in the  last 10 days please notify you surgeon.      Pre-operative 4 CHG Bath Instructions  DYNA-Hex 4 Chlorhexidine  Gluconate 4% Solution Antiseptic 4 fl. oz   You can play a key role in reducing the risk of infection after surgery. Your skin needs to be as free of germs as possible. You can reduce the number of germs on your skin by washing with CHG (chlorhexidine  gluconate) soap before surgery. CHG is an antiseptic soap that kills germs and continues to kill germs even after washing.   DO NOT use if you have an allergy to chlorhexidine /CHG or antibacterial soaps. If your skin becomes reddened or irritated, stop using the CHG and notify one of our RNs at   Please shower with the CHG soap starting 4 days before surgery using the following schedule:     Please keep in mind the following:  DO NOT shave, including legs and underarms, starting the day of your first shower.   You may shave your face at any point before/day of surgery.  Place clean sheets on your bed the day you start using CHG soap. Use a clean washcloth (not used since being washed) for each shower. DO NOT sleep with pets once you start using the CHG.  CHG Shower Instructions:  If you choose to wash your hair and private area, wash first with your normal shampoo/soap.  After you use shampoo/soap, rinse your hair and body thoroughly to remove shampoo/soap residue.  Turn the water  OFF and apply about 3 tablespoons (45 ml) of CHG soap  to a [employer].  Apply CHG soap ONLY FROM YOUR NECK DOWN TO YOUR TOES (washing for 3-5 minutes)  DO NOT use CHG soap on face, private areas, open wounds, or sores.  Pay special attention to the area where your surgery is being performed.  If you are having back surgery, having someone wash your back for you may be helpful. Wait 2 minutes after CHG soap is applied, then you may rinse off the CHG soap.  Pat dry with a clean towel  Put on clean clothes/pajamas   If you choose to wear lotion,  please use ONLY the CHG-compatible lotions on the back of this paper.     Additional instructions for the day of surgery: DO NOT APPLY any lotions, deodorants, cologne, or perfumes.   Put on clean/comfortable clothes.  Brush your teeth.  Ask your nurse before applying any prescription medications to the skin.   CHG Compatible Lotions   Aveeno Moisturizing lotion  Cetaphil Moisturizing Cream  Cetaphil Moisturizing Lotion  Clairol Herbal Essence Moisturizing Lotion, Dry Skin  Clairol Herbal Essence Moisturizing Lotion, Extra Dry Skin  Clairol Herbal Essence Moisturizing Lotion, Normal Skin  Curel Age Defying Therapeutic Moisturizing Lotion with Alpha Hydroxy  Curel Extreme Care Body Lotion  Curel Soothing Hands Moisturizing Hand Lotion  Curel Therapeutic Moisturizing Cream, Fragrance-Free  Curel Therapeutic Moisturizing Lotion, Fragrance-Free  Curel Therapeutic Moisturizing Lotion, Original Formula  Eucerin Daily Replenishing Lotion  Eucerin Dry Skin Therapy Plus Alpha Hydroxy Crme  Eucerin Dry Skin Therapy Plus Alpha Hydroxy Lotion  Eucerin Original Crme  Eucerin Original Lotion  Eucerin Plus Crme Eucerin Plus Lotion  Eucerin TriLipid Replenishing Lotion  Keri Anti-Bacterial Hand Lotion  Keri Deep Conditioning Original Lotion Dry Skin Formula Softly Scented  Keri Deep Conditioning Original Lotion, Fragrance Free Sensitive Skin Formula  Keri Lotion Fast Absorbing Fragrance Free Sensitive Skin Formula  Keri Lotion Fast Absorbing Softly Scented Dry Skin Formula  Keri Original Lotion  Keri Skin Renewal Lotion Keri Silky Smooth Lotion  Keri Silky Smooth Sensitive Skin Lotion  Nivea Body Creamy Conditioning Oil  Nivea Body Extra Enriched Lotion  Nivea Body Original Lotion  Nivea Body Sheer Moisturizing Lotion Nivea Crme  Nivea Skin Firming Lotion  NutraDerm 30 Skin Lotion  NutraDerm Skin Lotion  NutraDerm Therapeutic Skin Cream  NutraDerm Therapeutic Skin Lotion  ProShield  Protective Hand Cream  Incentive Spirometer  An incentive spirometer is a tool that can help keep your lungs clear and active. This tool measures how well you are filling your lungs with each breath. Taking long deep breaths may help reverse or decrease the chance of developing breathing (pulmonary) problems (especially infection) following: A long period of time when you are unable to move or be active. BEFORE THE PROCEDURE  If the spirometer includes an indicator to show your best effort, your nurse or respiratory therapist will set it to a desired goal. If possible, sit up straight or lean slightly forward. Try not to slouch. Hold the incentive spirometer in an upright position. INSTRUCTIONS FOR USE  Sit on the edge of your bed if possible, or sit up as far as you can in bed or on a chair. Hold the incentive spirometer in an upright position. Breathe out normally. Place the mouthpiece in your mouth and seal your lips tightly around it. Breathe in slowly and as deeply as possible, raising the piston or the ball toward the top of the column. Hold your breath for 3-5 seconds or for as long as  possible. Allow the piston or ball to fall to the bottom of the column. Remove the mouthpiece from your mouth and breathe out normally. Rest for a few seconds and repeat Steps 1 through 7 at least 10 times every 1-2 hours when you are awake. Take your time and take a few normal breaths between deep breaths. The spirometer may include an indicator to show your best effort. Use the indicator as a goal to work toward during each repetition. After each set of 10 deep breaths, practice coughing to be sure your lungs are clear. If you have an incision (the cut made at the time of surgery), support your incision when coughing by placing a pillow or rolled up towels firmly against it. Once you are able to get out of bed, walk around indoors and cough well. You may stop using the incentive spirometer when instructed  by your caregiver.  RISKS AND COMPLICATIONS Take your time so you do not get dizzy or light-headed. If you are in pain, you may need to take or ask for pain medication before doing incentive spirometry. It is harder to take a deep breath if you are having pain. AFTER USE Rest and breathe slowly and easily. It can be helpful to keep track of a log of your progress. Your caregiver can provide you with a simple table to help with this. If you are using the spirometer at home, follow these instructions: SEEK MEDICAL CARE IF:  You are having difficultly using the spirometer. You have trouble using the spirometer as often as instructed. Your pain medication is not giving enough relief while using the spirometer. You develop fever of 100.5 F (38.1 C) or higher. SEEK IMMEDIATE MEDICAL CARE IF:  You cough up bloody sputum that had not been present before. You develop fever of 102 F (38.9 C) or greater. You develop worsening pain at or near the incision site. MAKE SURE YOU:  Understand these instructions. Will watch your condition. Will get help right away if you are not doing well or get worse. Document Released: 01/30/2007 Document Revised: 12/12/2011 Document Reviewed: 04/02/2007 [facility] Patient Information 2014 [place], [place].

## 2024-07-19 NOTE — Progress Notes (Signed)
 COVID Vaccine received:  []  No [x]  Yes Date of any COVID positive Test in last 90 days: no PCP - Toribio Shan MD Cardiologist - Dr. Redell shallow  Chest x-ray -  EKG -  11/13/23 Epic Stress Test - 11/16/15 epic ECHO - 11/16/15 Epic Cardiac Cath -   Cardiac clearance 07/08/24 Barnie Hila NP  Bowel Prep - []  No  []   Yes ______  Pacemaker / ICD device [x]  No []  Yes   Spinal Cord Stimulator:[x]  No []  Yes       History of Sleep Apnea? [x]  No []  Yes   CPAP used?- [x]  No []  Yes    Does the patient monitor blood sugar?          [x]  No []  Yes  []  N/A  Patient has: [x]  NO Hx DM   []  Pre-DM                 []  DM1  []   DM2 Does patient have a [employer] or Dexacom? []  No []  Yes   Fasting Blood Sugar Ranges-  Checks Blood Sugar _____ times a day  GLP1 agonist / usual dose - no GLP1 instructions:  SGLT-2 inhibitors / usual dose - no SGLT-2 instructions:   Blood Thinner / Instructions:no Aspirin  Instructions:ASA 81 mg. Last dose was 07/21/24  Comments:   Activity level: Patient is able  to climb a flight of stairs without difficulty; [x]  No CP  [x]  No SOB, Patient can  perform ADLs without assistance.   Anesthesia review:   Patient denies shortness of breath, fever, cough and chest pain at PAT appointment.  Patient verbalized understanding and agreement to the Pre-Surgical Instructions that were given to them at this PAT appointment. Patient was also educated of the need to review these PAT instructions again prior to his/her surgery.I reviewed the appropriate phone numbers to call if they have any and questions or concerns.

## 2024-07-22 ENCOUNTER — Encounter (HOSPITAL_COMMUNITY)
Admission: RE | Admit: 2024-07-22 | Discharge: 2024-07-22 | Disposition: A | Discharge status: Home / Self Care | Source: Ambulatory Visit | Attending: Orthopedic Surgery | Admitting: Orthopedic Surgery

## 2024-07-22 ENCOUNTER — Other Ambulatory Visit: Payer: Self-pay

## 2024-07-22 ENCOUNTER — Encounter (HOSPITAL_COMMUNITY): Payer: Self-pay

## 2024-07-22 VITALS — BP 168/86 | HR 78 | Temp 98.0°F | Resp 16 | Ht 68.0 in | Wt 175.0 lb

## 2024-07-22 DIAGNOSIS — I10 Essential (primary) hypertension: Secondary | ICD-10-CM | POA: Insufficient documentation

## 2024-07-22 DIAGNOSIS — Z01812 Encounter for preprocedural laboratory examination: Secondary | ICD-10-CM | POA: Diagnosis not present

## 2024-07-22 DIAGNOSIS — Z01818 Encounter for other preprocedural examination: Secondary | ICD-10-CM

## 2024-07-22 HISTORY — DX: Personal history of urinary calculi: Z87.442

## 2024-07-22 LAB — BASIC METABOLIC PANEL WITH GFR
Anion gap: 11 (ref 5–15)
BUN: 23 mg/dL (ref 8–23)
CO2: 25 mmol/L (ref 22–32)
Calcium: 10.5 mg/dL — ABNORMAL HIGH (ref 8.9–10.3)
Chloride: 104 mmol/L (ref 98–111)
Creatinine, Ser: 1.29 mg/dL — ABNORMAL HIGH (ref 0.61–1.24)
GFR, Estimated: 55 mL/min — ABNORMAL LOW (ref >60)
Glucose, Bld: 84 mg/dL (ref 70–99)
Potassium: 4.3 mmol/L (ref 3.5–5.1)
Sodium: 140 mmol/L (ref 135–145)

## 2024-07-22 LAB — CBC
HCT: 45.5 % (ref 39.0–52.0)
Hemoglobin: 14.8 g/dL (ref 13.0–17.0)
MCH: 28.1 pg (ref 26.0–34.0)
MCHC: 32.5 g/dL (ref 30.0–36.0)
MCV: 86.5 fL (ref 80.0–100.0)
Platelets: 276 K/uL (ref 150–400)
RBC: 5.26 MIL/uL (ref 4.22–5.81)
RDW: 13.7 % (ref 11.5–15.5)
WBC: 9.8 K/uL (ref 4.0–10.5)
nRBC: 0 % (ref 0.0–0.2)

## 2024-07-22 LAB — SURGICAL PCR SCREEN
MRSA, PCR: NEGATIVE
Staphylococcus aureus: POSITIVE — AB

## 2024-07-23 NOTE — Progress Notes (Signed)
 Request sent to Dr. Melodi to review pt's pre op PCR result from 07/22/24.

## 2024-07-25 ENCOUNTER — Ambulatory Visit: Admitting: Podiatry

## 2024-07-25 ENCOUNTER — Encounter: Payer: Self-pay | Admitting: Podiatry

## 2024-07-25 DIAGNOSIS — M79675 Pain in left toe(s): Secondary | ICD-10-CM

## 2024-07-25 DIAGNOSIS — M79674 Pain in right toe(s): Secondary | ICD-10-CM | POA: Diagnosis not present

## 2024-07-25 DIAGNOSIS — B351 Tinea unguium: Secondary | ICD-10-CM

## 2024-07-29 ENCOUNTER — Other Ambulatory Visit: Payer: Self-pay

## 2024-07-29 ENCOUNTER — Encounter (HOSPITAL_COMMUNITY)
Admission: RE | Disposition: A | Discharge status: Home / Self Care | Payer: Self-pay | Source: Home / Self Care | Attending: Orthopedic Surgery

## 2024-07-29 ENCOUNTER — Observation Stay (HOSPITAL_COMMUNITY)
Admission: RE | Admit: 2024-07-29 | Discharge: 2024-07-30 | Disposition: A | Discharge status: Home / Self Care | Attending: Orthopedic Surgery | Admitting: Orthopedic Surgery

## 2024-07-29 ENCOUNTER — Ambulatory Visit (HOSPITAL_COMMUNITY): Admitting: Anesthesiology

## 2024-07-29 ENCOUNTER — Encounter (HOSPITAL_COMMUNITY): Payer: Self-pay | Admitting: Orthopedic Surgery

## 2024-07-29 DIAGNOSIS — Z79899 Other long term (current) drug therapy: Secondary | ICD-10-CM | POA: Diagnosis not present

## 2024-07-29 DIAGNOSIS — I129 Hypertensive chronic kidney disease with stage 1 through stage 4 chronic kidney disease, or unspecified chronic kidney disease: Secondary | ICD-10-CM | POA: Insufficient documentation

## 2024-07-29 DIAGNOSIS — N189 Chronic kidney disease, unspecified: Secondary | ICD-10-CM | POA: Diagnosis not present

## 2024-07-29 DIAGNOSIS — Z85828 Personal history of other malignant neoplasm of skin: Secondary | ICD-10-CM | POA: Insufficient documentation

## 2024-07-29 DIAGNOSIS — Z96641 Presence of right artificial hip joint: Secondary | ICD-10-CM | POA: Diagnosis not present

## 2024-07-29 DIAGNOSIS — Z87891 Personal history of nicotine dependence: Secondary | ICD-10-CM | POA: Diagnosis not present

## 2024-07-29 DIAGNOSIS — G8918 Other acute postprocedural pain: Secondary | ICD-10-CM | POA: Diagnosis not present

## 2024-07-29 DIAGNOSIS — M1711 Unilateral primary osteoarthritis, right knee: Principal | ICD-10-CM | POA: Insufficient documentation

## 2024-07-29 DIAGNOSIS — M179 Osteoarthritis of knee, unspecified: Principal | ICD-10-CM | POA: Diagnosis present

## 2024-07-29 DIAGNOSIS — M25561 Pain in right knee: Secondary | ICD-10-CM | POA: Diagnosis not present

## 2024-07-29 HISTORY — PX: TOTAL KNEE ARTHROPLASTY: SHX125

## 2024-07-29 SURGERY — ARTHROPLASTY, KNEE, TOTAL
Anesthesia: Monitor Anesthesia Care | Site: Knee | Laterality: Right

## 2024-07-29 MED ORDER — SODIUM CHLORIDE 0.9 % IR SOLN
Status: DC | PRN
  Administered 2024-07-29: 1000 mL

## 2024-07-29 MED ORDER — DOCUSATE SODIUM 100 MG PO CAPS
100.0000 mg | ORAL_CAPSULE | Freq: Two times a day (BID) | ORAL | Status: DC
  Administered 2024-07-29 – 2024-07-30 (×2): 100 mg via ORAL
  Filled 2024-07-29 (×2): qty 1

## 2024-07-29 MED ORDER — LACTATED RINGERS IV SOLN: INTRAVENOUS | Status: DC

## 2024-07-29 MED ORDER — DEXAMETHASONE SOD PHOSPHATE PF 10 MG/ML IJ SOLN
INTRAMUSCULAR | Status: DC | PRN
  Administered 2024-07-29: 10 mg

## 2024-07-29 MED ORDER — PROPOFOL 10 MG/ML IV BOLUS
INTRAVENOUS | Status: DC | PRN
  Administered 2024-07-29: 10 mg via INTRAVENOUS
  Administered 2024-07-29 (×2): 20 mg via INTRAVENOUS

## 2024-07-29 MED ORDER — ONDANSETRON HCL 4 MG/2ML IJ SOLN
INTRAMUSCULAR | Status: DC | PRN
  Administered 2024-07-29: 4 mg via INTRAVENOUS

## 2024-07-29 MED ORDER — DEXAMETHASONE SOD PHOSPHATE PF 10 MG/ML IJ SOLN
8.0000 mg | Freq: Once | INTRAMUSCULAR | Status: AC
  Administered 2024-07-29: 8 mg via INTRAVENOUS

## 2024-07-29 MED ORDER — DROPERIDOL 2.5 MG/ML IJ SOLN: 0.6250 mg | Freq: Once | INTRAMUSCULAR | Status: DC | PRN

## 2024-07-29 MED ORDER — PANTOPRAZOLE SODIUM 40 MG PO TBEC
80.0000 mg | DELAYED_RELEASE_TABLET | Freq: Every day | ORAL | Status: DC
  Administered 2024-07-29 – 2024-07-30 (×2): 80 mg via ORAL
  Filled 2024-07-29 (×2): qty 2

## 2024-07-29 MED ORDER — POVIDONE-IODINE 10 % EX SWAB: 2.0000 | Freq: Once | CUTANEOUS | Status: DC

## 2024-07-29 MED ORDER — ONDANSETRON HCL 4 MG/2ML IJ SOLN: 4.0000 mg | Freq: Four times a day (QID) | INTRAMUSCULAR | Status: DC | PRN

## 2024-07-29 MED ORDER — IRBESARTAN 150 MG PO TABS
300.0000 mg | ORAL_TABLET | Freq: Every day | ORAL | Status: DC
  Administered 2024-07-30: 300 mg via ORAL
  Filled 2024-07-29: qty 2

## 2024-07-29 MED ORDER — HYDRALAZINE HCL 50 MG PO TABS: 50.0000 mg | ORAL_TABLET | Freq: Every day | ORAL | Status: DC | PRN

## 2024-07-29 MED ORDER — FLUTICASONE PROPIONATE 50 MCG/ACT NA SUSP
2.0000 | Freq: Every day | NASAL | Status: DC
  Administered 2024-07-29: 2 via NASAL
  Filled 2024-07-29: qty 16

## 2024-07-29 MED ORDER — SODIUM CHLORIDE 0.9 % IV SOLN
INTRAVENOUS | Status: DC | PRN
  Administered 2024-07-29: 80 mL

## 2024-07-29 MED ORDER — FENTANYL CITRATE (PF) 50 MCG/ML IJ SOSY
50.0000 ug | PREFILLED_SYRINGE | INTRAMUSCULAR | Status: DC
  Administered 2024-07-29: 50 ug via INTRAVENOUS
  Filled 2024-07-29: qty 2

## 2024-07-29 MED ORDER — DIPHENHYDRAMINE HCL 12.5 MG/5ML PO ELIX: 12.5000 mg | ORAL_SOLUTION | ORAL | Status: DC | PRN

## 2024-07-29 MED ORDER — CHLORHEXIDINE GLUCONATE 4 % EX SOLN: 1.0000 | CUTANEOUS | 1 refills | Status: DC

## 2024-07-29 MED ORDER — FLEET ENEMA RE ENEM: 1.0000 | ENEMA | Freq: Once | RECTAL | Status: DC | PRN

## 2024-07-29 MED ORDER — CEFAZOLIN SODIUM-DEXTROSE 2-4 GM/100ML-% IV SOLN
2.0000 g | INTRAVENOUS | Status: AC
  Administered 2024-07-29: 2 g via INTRAVENOUS
  Filled 2024-07-29: qty 100

## 2024-07-29 MED ORDER — METOCLOPRAMIDE HCL 5 MG PO TABS: 5.0000 mg | ORAL_TABLET | Freq: Three times a day (TID) | ORAL | Status: DC | PRN

## 2024-07-29 MED ORDER — ACETAMINOPHEN 10 MG/ML IV SOLN: 1000.0000 mg | Freq: Once | INTRAVENOUS | Status: DC | PRN

## 2024-07-29 MED ORDER — CHLORHEXIDINE GLUCONATE 0.12 % MT SOLN
15.0000 mL | Freq: Once | OROMUCOSAL | Status: AC
  Administered 2024-07-29: 15 mL via OROMUCOSAL

## 2024-07-29 MED ORDER — PROPOFOL 500 MG/50ML IV EMUL
INTRAVENOUS | Status: DC | PRN
  Administered 2024-07-29: 70 ug/kg/min via INTRAVENOUS

## 2024-07-29 MED ORDER — BUPIVACAINE IN DEXTROSE 0.75-8.25 % IT SOLN
INTRATHECAL | Status: DC | PRN
  Administered 2024-07-29: 1.8 mL via INTRATHECAL

## 2024-07-29 MED ORDER — SODIUM CHLORIDE 0.9 % IV SOLN: INTRAVENOUS | Status: DC

## 2024-07-29 MED ORDER — METHOCARBAMOL 500 MG PO TABS
500.0000 mg | ORAL_TABLET | Freq: Four times a day (QID) | ORAL | Status: DC | PRN
Start: 2024-07-29 — End: 2024-07-30
  Administered 2024-07-30 (×3): 500 mg via ORAL
  Filled 2024-07-29 (×3): qty 1

## 2024-07-29 MED ORDER — ACETAMINOPHEN 10 MG/ML IV SOLN
1000.0000 mg | Freq: Once | INTRAVENOUS | Status: AC
  Administered 2024-07-29: 1000 mg via INTRAVENOUS
  Filled 2024-07-29: qty 100

## 2024-07-29 MED ORDER — ONDANSETRON HCL 4 MG/2ML IJ SOLN
INTRAMUSCULAR | Status: AC
Start: 2024-07-29 — End: 2024-07-29
  Filled 2024-07-29: qty 2

## 2024-07-29 MED ORDER — TRANEXAMIC ACID 1000 MG/10ML IV SOLN
INTRAVENOUS | Status: DC | PRN
  Administered 2024-07-29: 2000 mg via TOPICAL

## 2024-07-29 MED ORDER — HYDRALAZINE HCL 50 MG PO TABS
50.0000 mg | ORAL_TABLET | Freq: Two times a day (BID) | ORAL | Status: DC
  Administered 2024-07-29 – 2024-07-30 (×2): 50 mg via ORAL
  Filled 2024-07-29 (×2): qty 1

## 2024-07-29 MED ORDER — BUPIVACAINE LIPOSOME 1.3 % IJ SUSP: 20.0000 mL | Freq: Once | INTRAMUSCULAR | Status: DC

## 2024-07-29 MED ORDER — PROPOFOL 1000 MG/100ML IV EMUL
INTRAVENOUS | Status: AC
Start: 2024-07-29 — End: 2024-07-29
  Filled 2024-07-29: qty 100

## 2024-07-29 MED ORDER — 0.9 % SODIUM CHLORIDE (POUR BTL) OPTIME
TOPICAL | Status: DC | PRN
  Administered 2024-07-29: 1000 mL

## 2024-07-29 MED ORDER — OXYCODONE HCL 5 MG PO TABS: 10.0000 mg | ORAL_TABLET | ORAL | Status: DC | PRN

## 2024-07-29 MED ORDER — MORPHINE SULFATE (PF) 2 MG/ML IV SOLN: 1.0000 mg | INTRAVENOUS | Status: DC | PRN

## 2024-07-29 MED ORDER — BISACODYL 10 MG RE SUPP: 10.0000 mg | Freq: Every day | RECTAL | Status: DC | PRN

## 2024-07-29 MED ORDER — POLYETHYLENE GLYCOL 3350 17 G PO PACK: 17.0000 g | PACK | Freq: Every day | ORAL | Status: DC | PRN

## 2024-07-29 MED ORDER — MUPIROCIN 2 % EX OINT: 1.0000 | TOPICAL_OINTMENT | Freq: Two times a day (BID) | CUTANEOUS | 0 refills | Status: DC

## 2024-07-29 MED ORDER — RIVAROXABAN 10 MG PO TABS
10.0000 mg | ORAL_TABLET | Freq: Every day | ORAL | Status: DC
  Administered 2024-07-30: 10 mg via ORAL
  Filled 2024-07-29: qty 1

## 2024-07-29 MED ORDER — ROPIVACAINE HCL 7.5 MG/ML IJ SOLN
INTRAMUSCULAR | Status: DC | PRN
  Administered 2024-07-29: 20 mL via PERINEURAL

## 2024-07-29 MED ORDER — METHOCARBAMOL 1000 MG/10ML IJ SOLN: 500.0000 mg | Freq: Four times a day (QID) | INTRAMUSCULAR | Status: DC | PRN

## 2024-07-29 MED ORDER — DEXAMETHASONE SOD PHOSPHATE PF 10 MG/ML IJ SOLN
10.0000 mg | Freq: Once | INTRAMUSCULAR | Status: AC
  Administered 2024-07-30: 10 mg via INTRAVENOUS

## 2024-07-29 MED ORDER — PHENYLEPHRINE HCL-NACL 20-0.9 MG/250ML-% IV SOLN
INTRAVENOUS | Status: DC | PRN
  Administered 2024-07-29: 50 ug/min via INTRAVENOUS

## 2024-07-29 MED ORDER — METOCLOPRAMIDE HCL 5 MG/ML IJ SOLN: 5.0000 mg | Freq: Three times a day (TID) | INTRAMUSCULAR | Status: DC | PRN

## 2024-07-29 MED ORDER — ORAL CARE MOUTH RINSE: 15.0000 mL | Freq: Once | OROMUCOSAL | Status: AC

## 2024-07-29 MED ORDER — VALSARTAN-HYDROCHLOROTHIAZIDE 320-12.5 MG PO TABS: 1.0000 | ORAL_TABLET | Freq: Every day | ORAL | Status: DC

## 2024-07-29 MED ORDER — OXYCODONE HCL 5 MG PO TABS
5.0000 mg | ORAL_TABLET | ORAL | Status: DC | PRN
  Administered 2024-07-29 – 2024-07-30 (×2): 5 mg via ORAL
  Filled 2024-07-29: qty 1
  Filled 2024-07-29: qty 2

## 2024-07-29 MED ORDER — TRANEXAMIC ACID 1000 MG/10ML IV SOLN
2000.0000 mg | Freq: Once | INTRAVENOUS | Status: DC
  Filled 2024-07-29: qty 20

## 2024-07-29 MED ORDER — FENTANYL CITRATE (PF) 50 MCG/ML IJ SOSY: 25.0000 ug | PREFILLED_SYRINGE | INTRAMUSCULAR | Status: DC | PRN

## 2024-07-29 MED ORDER — CEFAZOLIN SODIUM-DEXTROSE 2-4 GM/100ML-% IV SOLN
2.0000 g | Freq: Four times a day (QID) | INTRAVENOUS | Status: AC
  Administered 2024-07-29 – 2024-07-30 (×2): 2 g via INTRAVENOUS
  Filled 2024-07-29 (×2): qty 100

## 2024-07-29 MED ORDER — ONDANSETRON HCL 4 MG PO TABS: 4.0000 mg | ORAL_TABLET | Freq: Four times a day (QID) | ORAL | Status: DC | PRN

## 2024-07-29 MED ORDER — TRAMADOL HCL 50 MG PO TABS: 50.0000 mg | ORAL_TABLET | Freq: Four times a day (QID) | ORAL | Status: DC | PRN

## 2024-07-29 MED ORDER — PHENOL 1.4 % MT LIQD: 1.0000 | OROMUCOSAL | Status: DC | PRN

## 2024-07-29 MED ORDER — ACETAMINOPHEN 325 MG PO TABS: 325.0000 mg | ORAL_TABLET | Freq: Four times a day (QID) | ORAL | Status: DC | PRN

## 2024-07-29 MED ORDER — ACETAMINOPHEN 500 MG PO TABS
1000.0000 mg | ORAL_TABLET | Freq: Four times a day (QID) | ORAL | Status: AC
  Administered 2024-07-29 – 2024-07-30 (×4): 1000 mg via ORAL
  Filled 2024-07-29 (×4): qty 2

## 2024-07-29 MED ORDER — MENTHOL 3 MG MT LOZG: 1.0000 | LOZENGE | OROMUCOSAL | Status: DC | PRN

## 2024-07-29 MED ORDER — HYDROCHLOROTHIAZIDE 12.5 MG PO TABS
12.5000 mg | ORAL_TABLET | Freq: Every day | ORAL | Status: DC
  Administered 2024-07-30: 12.5 mg via ORAL
  Filled 2024-07-29: qty 1

## 2024-07-29 SURGICAL SUPPLY — 39 items
ATTUNE MED DOME PAT 41 KNEE (Knees) IMPLANT
ATTUNE PS FEM RT SZ 6 CEM KNEE (Femur) IMPLANT
ATTUNE PSRP INSR SZ6 8 KNEE (Insert) IMPLANT
BAG ZIPLOCK 12X15 (MISCELLANEOUS) ×1 IMPLANT
BASE TIBIAL ROT PLAT SZ 7 KNEE (Knees) IMPLANT
BLADE SAG 18X100X1.27 (BLADE) ×1 IMPLANT
BLADE SAW SGTL 11.0X1.19X90.0M (BLADE) ×1 IMPLANT
BNDG ELASTIC 6INX 5YD STR LF (GAUZE/BANDAGES/DRESSINGS) ×1 IMPLANT
BOWL SMART MIX CTS (DISPOSABLE) ×1 IMPLANT
CEMENT HV SMART SET (Cement) ×2 IMPLANT
COVER SURGICAL LIGHT HANDLE (MISCELLANEOUS) ×1 IMPLANT
CUFF TRNQT CYL 34X4.125X (TOURNIQUET CUFF) ×1 IMPLANT
DERMABOND ADVANCED .7 DNX12 (GAUZE/BANDAGES/DRESSINGS) ×1 IMPLANT
DRAPE U-SHAPE 47X51 STRL (DRAPES) ×1 IMPLANT
DRSG AQUACEL AG ADV 3.5X10 (GAUZE/BANDAGES/DRESSINGS) ×1 IMPLANT
DURAPREP 26ML APPLICATOR (WOUND CARE) ×1 IMPLANT
ELECT REM PT RETURN 15FT ADLT (MISCELLANEOUS) ×1 IMPLANT
GLOVE BIO SURGEON STRL SZ 6.5 (GLOVE) IMPLANT
GLOVE BIO SURGEON STRL SZ8 (GLOVE) ×1 IMPLANT
GLOVE BIOGEL PI IND STRL 7.0 (GLOVE) ×1 IMPLANT
GLOVE BIOGEL PI IND STRL 8 (GLOVE) ×1 IMPLANT
GOWN STRL REUS W/ TWL LRG LVL3 (GOWN DISPOSABLE) ×1 IMPLANT
IMMOBILIZER KNEE 20 THIGH 36 (SOFTGOODS) ×1 IMPLANT
KIT TURNOVER KIT A (KITS) ×1 IMPLANT
MANIFOLD NEPTUNE II (INSTRUMENTS) ×1 IMPLANT
NS IRRIG 1000ML POUR BTL (IV SOLUTION) ×1 IMPLANT
PACK TOTAL KNEE CUSTOM (KITS) ×1 IMPLANT
PADDING CAST COTTON 6X4 STRL (CAST SUPPLIES) ×2 IMPLANT
PENCIL SMOKE EVACUATOR (MISCELLANEOUS) ×1 IMPLANT
PIN STEINMAN FIXATION KNEE (PIN) IMPLANT
PROTECTOR NERVE ULNAR (MISCELLANEOUS) ×1 IMPLANT
SET HNDPC FAN SPRY TIP SCT (DISPOSABLE) ×1 IMPLANT
SUT MNCRL AB 4-0 PS2 18 (SUTURE) ×1 IMPLANT
SUT VIC AB 2-0 CT1 TAPERPNT 27 (SUTURE) ×3 IMPLANT
SUTURE STRATFX 0 PDS 27 VIOLET (SUTURE) ×1 IMPLANT
TOWEL GREEN STERILE FF (TOWEL DISPOSABLE) ×1 IMPLANT
TRAY FOLEY MTR SLVR 16FR STAT (SET/KITS/TRAYS/PACK) ×1 IMPLANT
TUBE SUCTION HIGH CAP CLEAR NV (SUCTIONS) ×1 IMPLANT
WRAP KNEE MAXI GEL POST OP (GAUZE/BANDAGES/DRESSINGS) ×1 IMPLANT

## 2024-07-29 NOTE — Anesthesia Preprocedure Evaluation (Addendum)
 Anesthesia Evaluation  Patient identified by MRN, date of birth, ID band Patient awake    Reviewed: Allergy & Precautions, NPO status , Patient's Chart, lab work & pertinent test results  Airway Mallampati: II  TM Distance: >3 FB Neck ROM: Full    Dental no notable dental hx.    Pulmonary former smoker   Pulmonary exam normal        Cardiovascular hypertension, Pt. on medications  Rhythm:Regular Rate:Normal     Neuro/Psych negative neurological ROS  negative psych ROS   GI/Hepatic Neg liver ROS, hiatal hernia,GERD  Medicated,,  Endo/Other  negative endocrine ROS    Renal/GU CRFRenal disease  negative genitourinary   Musculoskeletal  (+) Arthritis , Osteoarthritis,    Abdominal Normal abdominal exam  (+)   Peds  Hematology Lab Results      Component                Value               Date                      WBC                      9.8                 07/22/2024                HGB                      14.8                07/22/2024                HCT                      45.5                07/22/2024                MCV                      86.5                07/22/2024                PLT                      276                 07/22/2024             Lab Results      Component                Value               Date                      NA                       140                 07/22/2024                K  4.3                 07/22/2024                CO2                      25                  07/22/2024                GLUCOSE                  84                  07/22/2024                BUN                      23                  07/22/2024                CREATININE               1.29 (H)            07/22/2024                CALCIUM                  10.5 (H)            07/22/2024                GFRNONAA                 55 (L)              07/22/2024              Anesthesia Other  Findings   Reproductive/Obstetrics                              Anesthesia Physical Anesthesia Plan  ASA: 2  Anesthesia Plan: MAC, Regional and Spinal   Post-op Pain Management: Regional block*   Induction: Intravenous  PONV Risk Score and Plan: 1 and Ondansetron , Dexamethasone  and Treatment may vary due to age or medical condition  Airway Management Planned: Simple Face Mask and Nasal Cannula  Additional Equipment: None  Intra-op Plan:   Post-operative Plan:   Informed Consent: I have reviewed the patients History and Physical, chart, labs and discussed the procedure including the risks, benefits and alternatives for the proposed anesthesia with the patient or authorized representative who has indicated his/her understanding and acceptance.       Plan Discussed with: CRNA  Anesthesia Plan Comments:          Anesthesia Quick Evaluation

## 2024-07-29 NOTE — Anesthesia Procedure Notes (Signed)
 Anesthesia Regional Block: Adductor canal block   Pre-Anesthetic Checklist: , timeout performed,  Correct Patient, Correct Site, Correct Laterality,  Correct Procedure, Correct Position, site marked,  Risks and benefits discussed,  Surgical consent,  Pre-op evaluation,  At surgeon's request and post-op pain management  Laterality: Right  Prep: Dura Prep       Needles:  Injection technique: Single-shot  Needle Type: Echogenic Stimulator Needle     Needle Length: 10cm  Needle Gauge: 20     Additional Needles:   Procedures:,,,, ultrasound used (permanent image in chart),,    Narrative:  Start time: 07/29/2024 11:50 AM End time: 07/29/2024 11:55 AM Injection made incrementally with aspirations every 5 mL.  Performed by: Personally  Anesthesiologist: Dorethea Cordella SQUIBB, DO  Additional Notes: Patient identified. Risks/Benefits/Options discussed with patient including but not limited to bleeding, infection, nerve damage, failed block, incomplete pain control. Patient expressed understanding and wished to proceed. All questions were answered. Sterile technique was used throughout the entire procedure. Please see nursing notes for vital signs. Aspirated in 5cc intervals with injection for negative confirmation. Patient was given instructions on fall risk and not to get out of bed. All questions and concerns addressed with instructions to call with any issues or inadequate analgesia.

## 2024-07-29 NOTE — Interval H&P Note (Signed)
 History and Physical Interval Note:  07/29/2024 10:26 AM  Rodgers LITTIE Hummer  has presented today for surgery, with the diagnosis of Right knee osteoarthritis.  The various methods of treatment have been discussed with the patient and family. After consideration of risks, benefits and other options for treatment, the patient has consented to  Procedure(s): ARTHROPLASTY, KNEE, TOTAL (Right) as a surgical intervention.  The patient's history has been reviewed, patient examined, no change in status, stable for surgery.  I have reviewed the patient's chart and labs.  Questions were answered to the patient's satisfaction.     Dempsey Edmundo Tedesco

## 2024-07-29 NOTE — Op Note (Signed)
 OPERATIVE REPORT-TOTAL KNEE ARTHROPLASTY   Pre-operative diagnosis- Osteoarthritis  Right knee(s)  Post-operative diagnosis- Osteoarthritis Right knee(s)  Procedure-  Right  Total Knee Arthroplasty  Surgeon- Dempsey GAILS. Melitza Metheny, MD  Assistant- Corean Sender, PA-C   Anesthesia-  Adductor canal block and spinal  EBL-20 mL   Drains None  Tourniquet time-  Total Tourniquet Time Documented: Thigh (Right) - 37 minutes Total: Thigh (Right) - 37 minutes     Complications- None  Condition-PACU - hemodynamically stable.   Brief Clinical Note  Frank Johnston is a 83 y.o. year old male with end stage OA of his right knee with progressively worsening pain and dysfunction. He has constant pain, with activity and at rest and significant functional deficits with difficulties even with ADLs. He has had extensive non-op management including analgesics, injections of cortisone and viscosupplements, and home exercise program, but remains in significant pain with significant dysfunction. Radiographs show bone on bone arthritis medial and patellofemoral. He presents now for right Total Knee Arthroplasty.     Procedure in detail---   The patient is brought into the operating room and positioned supine on the operating table. After successful administration of  Adductor canal block and spinal,   a tourniquet is placed high on the  Right thigh(s) and the lower extremity is prepped and draped in the usual sterile fashion. Time out is performed by the operating team and then the  Right lower extremity is wrapped in Esmarch, knee flexed and the tourniquet inflated to 300 mmHg.       A midline incision is made with a ten blade through the subcutaneous tissue to the level of the extensor mechanism. A fresh blade is used to make a medial parapatellar arthrotomy. Soft tissue over the proximal medial tibia is subperiosteally elevated to the joint line with a knife and into the semimembranosus bursa with a  Cobb elevator. Soft tissue over the proximal lateral tibia is elevated with attention being paid to avoiding the patellar tendon on the tibial tubercle. The patella is everted, knee flexed 90 degrees and the ACL and PCL are removed. Findings are bone on bone medial and patellofemoral with large global osteophytes        The drill is used to create a starting hole in the distal femur and the canal is thoroughly irrigated with sterile saline to remove the fatty contents. The 5 degree Right  valgus alignment guide is placed into the femoral canal and the distal femoral cutting block is pinned to remove 10 mm off the distal femur. Resection is made with an oscillating saw.      The tibia is subluxed forward and the menisci are removed. The extramedullary alignment guide is placed referencing proximally at the medial aspect of the tibial tubercle and distally along the second metatarsal axis and tibial crest. The block is pinned to remove 2mm off the more deficient medial  side. Resection is made with an oscillating saw. Size 7is the most appropriate size for the tibia and the proximal tibia is prepared with the modular drill and keel punch for that size.      The femoral sizing guide is placed and size 6 is most appropriate. Rotation is marked off the epicondylar axis and confirmed by creating a rectangular flexion gap at 90 degrees. The size 6 cutting block is pinned in this rotation and the anterior, posterior and chamfer cuts are made with the oscillating saw. The intercondylar block is then placed and that cut is made.  Trial size 7 tibial component, trial size 6 posterior stabilized femur and a 8  mm posterior stabilized rotating platform insert trial is placed. Full extension is achieved with excellent varus/valgus and anterior/posterior balance throughout full range of motion. The patella is everted and thickness measured to be 27  mm. Free hand resection is taken to 15 mm, a 41 template is placed, lug  holes are drilled, trial patella is placed, and it tracks normally. Osteophytes are removed off the posterior femur with the trial in place. All trials are removed and the cut bone surfaces prepared with pulsatile lavage. Cement is mixed and once ready for implantation, the size 7 tibial implant, size  6 posterior stabilized femoral component, and the size 41 patella are cemented in place and the patella is held with the clamp. The trial insert is placed and the knee held in full extension. The Exparel  (20 ml mixed with 60 ml saline) is injected into the extensor mechanism, posterior capsule, medial and lateral gutters and subcutaneous tissues.  All extruded cement is removed and once the cement is hard the permanent 8 mm posterior stabilized rotating platform insert is placed into the tibial tray.      The wound is copiously irrigated with saline solution and the extensor mechanism closed with # 0 Stratofix suture. The tourniquet is released for a total tourniquet time of 37  minutes. Flexion against gravity is 140 degrees and the patella tracks normally. Subcutaneous tissue is closed with 2.0 vicryl and subcuticular with running 4.0 Monocryl. The incision is cleaned and dried and steri-strips and a bulky sterile dressing are applied. The limb is placed into a knee immobilizer and the patient is awakened and transported to recovery in stable condition.      Please note that a surgical assistant was a medical necessity for this procedure in order to perform it in a safe and expeditious manner. Surgical assistant was necessary to retract the ligaments and vital neurovascular structures to prevent injury to them and also necessary for proper positioning of the limb to allow for anatomic placement of the prosthesis.   [name], MD    07/29/2024, 1:41 PM

## 2024-07-29 NOTE — Progress Notes (Signed)
 Orthopedic Tech Progress Note Patient Details:  Frank Johnston 30-Jun-1941 [id number] Applied CPM per order. Will remove CPM at 6:13pm.  CPM Right Knee CPM Right Knee: On Right Knee Flexion (Degrees): 40 Right Knee Extension (Degrees): 10  Post Interventions Patient Tolerated: Well Instructions Provided: Adjustment of device, Care of device, Poper ambulation with device Ortho Devices Type of Ortho Device: CPM padding Ortho Device/Splint Location: RLE Ortho Device/Splint Interventions: Ordered, Application, Adjustment   Post Interventions Patient Tolerated: Well Instructions Provided: Adjustment of device, Care of device, Poper ambulation with device  Frank Johnston 07/29/2024, 2:27 PM

## 2024-07-29 NOTE — Discharge Instructions (Addendum)
 Frank Moan, MD Total Joint Specialist [facility] [address] [place], [place] [address] [phone]  TOTAL KNEE REPLACEMENT POSTOPERATIVE DIRECTIONS    Knee Rehabilitation, Guidelines Following Surgery  Results after knee surgery are often greatly improved when you follow the exercise, range of motion and muscle strengthening exercises prescribed by your doctor. Safety measures are also important to protect the knee from further injury. If any of these exercises cause you to have increased pain or swelling in your knee joint, decrease the amount until you are comfortable again and slowly increase them. If you have problems or questions, call your caregiver or physical therapist for advice.   BLOOD CLOT PREVENTION Take a 10 mg Xarelto  once a day for three weeks following surgery. Then resume taking 81 mg Aspirin  once daily.  You may resume your vitamins/supplements once you have discontinued the Xarelto . Do not take any NSAIDs (Advil, Aleve , Ibuprofen, Meloxicam, etc.) until you have discontinued the Xarelto .    HOME CARE INSTRUCTIONS  Remove items at home which could result in a fall. This includes throw rugs or furniture in walking pathways.  ICE to the affected knee as much as tolerated. Icing helps control swelling. If the swelling is well controlled you will be more comfortable and rehab easier. Continue to use ice on the knee for pain and swelling from surgery. You may notice swelling that will progress down to the foot and ankle. This is normal after surgery. Elevate the leg when you are not up walking on it.    Continue to use the breathing machine which will help keep your temperature down. It is common for your temperature to cycle up and down following surgery, especially at night when you are not up moving around and exerting yourself. The breathing machine keeps your lungs expanded and your temperature down. Do not place pillow under the  operative knee, focus on keeping the knee straight while resting  DIET You may resume your previous home diet once you are discharged from the hospital.  DRESSING / WOUND CARE / SHOWERING Keep your bulky bandage on for 2 days. On the third post-operative day you may remove the Ace bandage and gauze. There is a waterproof adhesive bandage on your skin which will stay in place until your first follow-up appointment. Once you remove this you will not need to place another bandage You may begin showering 3 days following surgery, but do not submerge the incision under water .  ACTIVITY For the first 5 days, the key is rest and control of pain and swelling Do your home exercises twice a day starting on post-operative day 3. On the days you go to physical therapy, just do the home exercises once that day. You should rest, ice and elevate the leg for 50 minutes out of every hour. Get up and walk/stretch for 10 minutes per hour. After 5 days you can increase your activity slowly as tolerated. Walk with your walker as instructed. Use the walker until you are comfortable transitioning to a cane. Walk with the cane in the opposite hand of the operative leg. You may discontinue the cane once you are comfortable and walking steadily. Avoid periods of inactivity such as sitting longer than an hour when not asleep. This helps prevent blood clots.  You may discontinue the knee immobilizer once you are able to perform a straight leg raise while lying down. You may resume a sexual relationship in one month or when given the OK by  your doctor.  You may return to work once you are cleared by your doctor.  Do not drive a car for 6 weeks or until released by your surgeon.  Do not drive while taking narcotics.  TED HOSE STOCKINGS Wear the elastic stockings on both legs for three weeks following surgery during the day. You may remove them at night for sleeping.  WEIGHT BEARING Weight bearing as tolerated with assist  device (walker, cane, etc) as directed, use it as long as suggested by your surgeon or therapist, typically at least 4-6 weeks.  POSTOPERATIVE CONSTIPATION PROTOCOL Constipation - defined medically as fewer than three stools per week and severe constipation as less than one stool per week.  One of the most common issues patients have following surgery is constipation.  Even if you have a regular bowel pattern at home, your normal regimen is likely to be disrupted due to multiple reasons following surgery.  Combination of anesthesia, postoperative narcotics, change in appetite and fluid intake all can affect your bowels.  In order to avoid complications following surgery, here are some recommendations in order to help you during your recovery period.  Colace (docusate) - Pick up an over-the-counter form of Colace or another stool softener and take twice a day as long as you are requiring postoperative pain medications.  Take with a full glass of water  daily.  If you experience loose stools or diarrhea, hold the colace until you stool forms back up. If your symptoms do not get better within 1 week or if they get worse, check with your doctor. Dulcolax (bisacodyl ) - Pick up over-the-counter and take as directed by the product packaging as needed to assist with the movement of your bowels.  Take with a full glass of water .  Use this product as needed if not relieved by Colace only.  MiraLax  (polyethylene glycol) - Pick up over-the-counter to have on hand. MiraLax  is a solution that will increase the amount of water  in your bowels to assist with bowel movements.  Take as directed and can mix with a glass of water , juice, soda, coffee, or tea. Take if you go more than two days without a movement. Do not use MiraLax  more than once per day. Call your doctor if you are still constipated or irregular after using this medication for 7 days in a row.  If you continue to have problems with postoperative constipation,  please contact the office for further assistance and recommendations.  If you experience the worst abdominal pain ever or develop nausea or vomiting, please contact the office immediatly for further recommendations for treatment.  ITCHING If you experience itching with your medications, try taking only a single pain pill, or even half a pain pill at a time.  You can also use Benadryl  over the counter for itching or also to help with sleep.   MEDICATIONS See your medication summary on the "After Visit Summary" that the nursing staff will review with you prior to discharge.  You may have some home medications which will be placed on hold until you complete the course of blood thinner medication.  It is important for you to complete the blood thinner medication as prescribed by your surgeon.  Continue your approved medications as instructed at time of discharge.  PRECAUTIONS If you experience chest pain or shortness of breath - call 911 immediately for transfer to the hospital emergency department.  If you develop a fever greater that 101 F, purulent drainage from wound, increased redness  or drainage from wound, foul odor from the wound/dressing, or calf pain - CONTACT YOUR SURGEON.                                                   FOLLOW-UP APPOINTMENTS Make sure you keep all of your appointments after your operation with your surgeon and caregivers. You should call the office at the above phone number and make an appointment for approximately two weeks after the date of your surgery or on the date instructed by your surgeon outlined in the After Visit Summary.  RANGE OF MOTION AND STRENGTHENING EXERCISES  Rehabilitation of the knee is important following a knee injury or an operation. After just a few days of immobilization, the muscles of the thigh which control the knee become weakened and shrink (atrophy). Knee exercises are designed to build up the tone and strength of the thigh muscles and to  improve knee motion. Often times heat used for twenty to thirty minutes before working out will loosen up your tissues and help with improving the range of motion but do not use heat for the first two weeks following surgery. These exercises can be done on a training (exercise) mat, on the floor, on a table or on a bed. Use what ever works the best and is most comfortable for you Knee exercises include:  Leg Lifts - While your knee is still immobilized in a splint or cast, you can do straight leg raises. Lift the leg to 60 degrees, hold for 3 sec, and slowly lower the leg. Repeat 10-20 times 2-3 times daily. Perform this exercise against resistance later as your knee gets better.  Quad and Hamstring Sets - Tighten up the muscle on the front of the thigh (Quad) and hold for 5-10 sec. Repeat this 10-20 times hourly. Hamstring sets are done by pushing the foot backward against an object and holding for 5-10 sec. Repeat as with quad sets.  Leg Slides: Lying on your back, slowly slide your foot toward your buttocks, bending your knee up off the floor (only go as far as is comfortable). Then slowly slide your foot back down until your leg is flat on the floor again. Angel Wings: Lying on your back spread your legs to the side as far apart as you can without causing discomfort.  A rehabilitation program following serious knee injuries can speed recovery and prevent re-injury in the future due to weakened muscles. Contact your doctor or a physical therapist for more information on knee rehabilitation.   POST-OPERATIVE OPIOID TAPER INSTRUCTIONS: It is important to wean off of your opioid medication as soon as possible. If you do not need pain medication after your surgery it is ok to stop day one. Opioids include: Codeine, Hydrocodone (Norco, Vicodin), Oxycodone (Percocet, oxycontin ) and hydromorphone  amongst others.  Long term and even short term use of opiods can cause: Increased pain  response Dependence Constipation Depression Respiratory depression And more.  Withdrawal symptoms can include Flu like symptoms Nausea, vomiting And more Techniques to manage these symptoms Hydrate well Eat regular healthy meals Stay active Use relaxation techniques(deep breathing, meditating, yoga) Do Not substitute Alcohol  to help with tapering If you have been on opioids for less than two weeks and do not have pain than it is ok to stop all together.  Plan to wean off of opioids This  plan should start within one week post op of your joint replacement. Maintain the same interval or time between taking each dose and first decrease the dose.  Cut the total daily intake of opioids by one tablet each day Next start to increase the time between doses. The last dose that should be eliminated is the evening dose.   IF YOU ARE TRANSFERRED TO A SKILLED REHAB FACILITY If the patient is transferred to a skilled rehab facility following release from the hospital, a list of the current medications will be sent to the facility for the patient to continue.  When discharged from the skilled rehab facility, please have the facility set up the patient's Home Health Physical Therapy prior to being released. Also, the skilled facility will be responsible for providing the patient with their medications at time of release from the facility to include their pain medication, the muscle relaxants, and their blood thinner medication. If the patient is still at the rehab facility at time of the two week follow up appointment, the skilled rehab facility will also need to assist the patient in arranging follow up appointment in our office and any transportation needs.  MAKE SURE YOU:  Understand these instructions.  Get help right away if you are not doing well or get worse.   DENTAL ANTIBIOTICS:  In most cases prophylactic antibiotics for Dental procdeures after total joint surgery are not  necessary.  Exceptions are as follows:  1. History of prior total joint infection  2. Severely immunocompromised (Organ Transplant, cancer chemotherapy, Rheumatoid biologic meds such as Humera)  3. Poorly controlled diabetes (A1C &gt; 8.0, blood glucose over 200)  If you have one of these conditions, contact your surgeon for an antibiotic prescription, prior to your dental procedure.    Pick up stool softner and laxative for home use following surgery while on pain medications. Do not submerge incision under water . Please use good hand washing techniques while changing dressing each day. May shower starting three days after surgery. Please use a clean towel to pat the incision dry following showers. Continue to use ice for pain and swelling after surgery. Do not use any lotions or creams on the incision until instructed by your surgeon.    Information on my medicine - XARELTO  (Rivaroxaban )  Why was Xarelto  prescribed for you? Xarelto  was prescribed for you to reduce the risk of blood clots forming after orthopedic surgery. The medical term for these abnormal blood clots is venous thromboembolism (VTE).  What do you need to know about xarelto  ? Take your Xarelto  ONCE DAILY at the same time every day. You may take it either with or without food.  If you have difficulty swallowing the tablet whole, you may crush it and mix in applesauce just prior to taking your dose.  Take Xarelto  exactly as prescribed by your doctor and DO NOT stop taking Xarelto  without talking to the doctor who prescribed the medication.  Stopping without other VTE prevention medication to take the place of Xarelto  may increase your risk of developing a clot.  After discharge, you should have regular check-up appointments with your healthcare provider that is prescribing your Xarelto .    What do you do if you miss a dose? If you miss a dose, take it as soon as you remember on the same day then  continue your regularly scheduled once daily regimen the next day. Do not take two doses of Xarelto  on the same day.   Important Safety Information A  possible side effect of Xarelto  is bleeding. You should call your healthcare provider right away if you experience any of the following: Bleeding from an injury or your nose that does not stop. Unusual colored urine (red or dark brown) or unusual colored stools (red or black). Unusual bruising for unknown reasons. A serious fall or if you hit your head (even if there is no bleeding).  Some medicines may interact with Xarelto  and might increase your risk of bleeding while on Xarelto . To help avoid this, consult your healthcare provider or pharmacist prior to using any new prescription or non-prescription medications, including herbals, vitamins, non-steroidal anti-inflammatory drugs (NSAIDs) and supplements.  This website has more information on Xarelto : www.xarelto .com.

## 2024-07-29 NOTE — Transfer of Care (Signed)
 Immediate Anesthesia Transfer of Care Note  Patient: Frank Johnston  Procedure(s) Performed: ARTHROPLASTY, KNEE, TOTAL (Right: Knee)  Patient Location: PACU  Anesthesia Type:Spinal  Level of Consciousness: sedated  Airway & Oxygen Therapy: Patient Spontanous Breathing and Patient connected to face mask oxygen  Post-op Assessment: Report given to RN and Post -op Vital signs reviewed and stable  Post vital signs: Reviewed and stable  Last Vitals:  Vitals Value Taken Time  BP    Temp    Pulse 74 07/29/24 13:55  Resp    SpO2 100 % 07/29/24 13:55    Last Pain:  Vitals:   07/29/24 1028  TempSrc: Oral  PainSc: 5          Complications: No notable events documented.

## 2024-07-29 NOTE — Anesthesia Postprocedure Evaluation (Signed)
 Anesthesia Post Note  Patient: Frank Johnston  Procedure(s) Performed: ARTHROPLASTY, KNEE, TOTAL (Right: Knee)     Patient location during evaluation: PACU Anesthesia Type: Regional, MAC and Spinal Level of consciousness: awake and alert Pain management: pain level controlled Vital Signs Assessment: post-procedure vital signs reviewed and stable Respiratory status: spontaneous breathing, nonlabored ventilation, respiratory function stable and patient connected to nasal cannula oxygen Cardiovascular status: stable and blood pressure returned to baseline Postop Assessment: no apparent nausea or vomiting Anesthetic complications: no   No notable events documented.  Last Vitals:  Vitals:   07/29/24 1645 07/29/24 1712  BP: 133/74 (!) 157/92  Pulse: 74 78  Resp: 10 16  Temp:  36.6 C  SpO2: 96% 99%    Last Pain:  Vitals:   07/29/24 1712  TempSrc: Oral  PainSc: 0-No pain                 Cordella P Neyah Ellerman

## 2024-07-29 NOTE — Anesthesia Procedure Notes (Signed)
 Spinal  Patient location during procedure: OR Start time: 07/29/2024 12:29 PM End time: 07/29/2024 12:31 PM Reason for block: surgical anesthesia Staffing Performed: resident/CRNA  Anesthesiologist: Dorethea Cordella SQUIBB, DO Resident/CRNA: Carleton Garnette SAUNDERS, CRNA Performed by: Carleton Garnette SAUNDERS, CRNA Authorized by: Dorethea Cordella SQUIBB, DO   Preanesthetic Checklist Completed: patient identified, IV checked, site marked, risks and benefits discussed, surgical consent, monitors and equipment checked, pre-op evaluation and timeout performed Spinal Block Patient position: sitting Prep: DuraPrep Patient monitoring: heart rate, cardiac monitor, continuous pulse ox and blood pressure Approach: midline Location: L3-4 Injection technique: single-shot Needle Needle type: Pencan  Needle gauge: 24 G Needle length: 10 cm Needle insertion depth: 7 cm Assessment Sensory level: T6 Events: CSF return Additional Notes Timeout performed. Patient in sitting position. L3-4 identified. Cleansed with Duraprep. SAB without difficulty. To supine position.

## 2024-07-29 NOTE — Progress Notes (Signed)
 Orthopedic Tech Progress Note Patient Details:  Frank Johnston [birth year] [id number]  Patient ID: Frank Johnston, male   DOB: [birth year], 83 y.o.   MRN: [id number] Removed pt from CPM.  Morna Pink 07/29/2024, 6:31 PM

## 2024-07-29 NOTE — Plan of Care (Signed)

## 2024-07-29 NOTE — Evaluation (Signed)
 Physical Therapy Evaluation Patient Details Name: Frank Johnston MRN: [id number] DOB: Jul 03, 1941 Today's Date: 07/29/2024  History of Present Illness  83 yo male presents to therapy s/p R TKA on 07/29/2024 due to failure of conservative measures. Pt PMH includes but is not limited to: DVT, PE, HTN, ileus, arthritis, CKD, GERD, hiatal hernia, back surgery, L TKA (2012) and revision and R THA, anterior approach on 12/06/2023.  Clinical Impression    Frank Johnston is a 83 y.o. male POD 0 s/p R TKA. Patient reports mod i with mobility at baseline. Patient is now limited by functional impairments (see PT problem list below) and requires CGA for bed mobility and CGA for transfers. Patient was able to ambulate 35 feet with RW and CGA level of assist. Patient instructed in exercise to facilitate ROM and circulation to manage edema. Patient will benefit from continued skilled PT interventions to address impairments and progress towards PLOF. Acute PT will follow to progress mobility and stair training in preparation for safe discharge home with family support and OPPT services.       If plan is discharge home, recommend the following: A little help with walking and/or transfers;A little help with bathing/dressing/bathroom;Assistance with cooking/housework;Assist for transportation;Help with stairs or ramp for entrance   Can travel by private vehicle        Equipment Recommendations None recommended by PT  Recommendations for Other Services       Functional Status Assessment Patient has had a recent decline in their functional status and demonstrates the ability to make significant improvements in function in a reasonable and predictable amount of time.     Precautions / Restrictions Precautions Precautions: Knee;Fall Restrictions Weight Bearing Restrictions Per Provider Order: Yes RLE Weight Bearing Per Provider Order: Weight bearing as tolerated      Mobility  Bed Mobility Overal  bed mobility: Needs Assistance Bed Mobility: Supine to Sit     Supine to sit: Contact guard, HOB elevated     General bed mobility comments: min cues for R LE to EOB    Transfers Overall transfer level: Needs assistance Equipment used: Rolling walker (2 wheels) Transfers: Sit to/from Stand Sit to Stand: Contact guard assist           General transfer comment: min cues and pull to stand    Ambulation/Gait Ambulation/Gait assistance: Contact guard assist Gait Distance (Feet): 35 Feet Assistive device: Rolling walker (2 wheels) Gait Pattern/deviations: Step-to pattern, Decreased stance time - right, Antalgic, Trunk flexed Gait velocity: decreased     General Gait Details: slight trunk flexion with B UE support at RW to offload R LE in stance phase min cues  Stairs            Wheelchair Mobility     Tilt Bed    Modified Rankin (Stroke Patients Only)       Balance Overall balance assessment: Mild deficits observed, not formally tested, Needs assistance Sitting-balance support: Feet supported Sitting balance-Leahy Scale: Good     Standing balance support: Bilateral upper extremity supported, During functional activity, Reliant on assistive device for balance Standing balance-Leahy Scale: Poor                               Pertinent Vitals/Pain Pain Assessment Pain Assessment: 0-10 Pain Score: 3  Pain Location: R knee and LE Pain Descriptors / Indicators: Aching, Discomfort, Grimacing, Operative site guarding Pain Intervention(s): Limited activity within patient's tolerance,  Monitored during session, Premedicated before session, Repositioned, Ice applied    Home Living Family/patient expects to be discharged to:: Private residence Living Arrangements: Spouse/significant other Available Help at Discharge: Family (daughter lives close) Type of Home: Mobile home Home Access: Stairs to enter Entrance Stairs-Rails: Right;Left;Can reach  both Secretary/administrator of Steps: 4   Home Layout: One level Home Equipment: BSC/3in1;Cane - single point;Rolling Walker (2 wheels)      Prior Function Prior Level of Function : Independent/Modified Independent;Driving             Mobility Comments: IND without AD until about one wk ago and pt required use of RW due to R LE/knee pain and mod I for all ADLs, self care tasks and IADls       Extremity/Trunk Assessment        Lower Extremity Assessment Lower Extremity Assessment: RLE deficits/detail RLE Deficits / Details: ankle DF/Pf 5/5; SLR < 10 degrees RLE Sensation: WNL    Cervical / Trunk Assessment Cervical / Trunk Assessment: Normal  Communication   Communication Communication: No apparent difficulties    Cognition Arousal: Alert Behavior During Therapy: WFL for tasks assessed/performed   PT - Cognitive impairments: No apparent impairments                         Following commands: Intact       Cueing       General Comments      Exercises Total Joint Exercises Ankle Circles/Pumps: AROM, Both, 10 reps   Assessment/Plan    PT Assessment Patient needs continued PT services  PT Problem List Decreased strength;Decreased range of motion;Decreased activity tolerance;Decreased balance;Decreased mobility;Decreased coordination;Pain       PT Treatment Interventions DME instruction;Gait training;Stair training;Functional mobility training;Therapeutic activities;Therapeutic exercise;Balance training;Modalities;Neuromuscular re-education;Patient/family education    PT Goals (Current goals can be found in the Care Plan section)  Acute Rehab PT Goals Patient Stated Goal: to be able to keep up with the [facility] PT Goal Formulation: With patient Time For Goal Achievement: 08/12/24 Potential to Achieve Goals: Good    Frequency 7X/week     Co-evaluation               AM-PAC PT 6 Clicks Mobility  Outcome Measure Help needed  turning from your back to your side while in a flat bed without using bedrails?: None Help needed moving from lying on your back to sitting on the side of a flat bed without using bedrails?: A Little Help needed moving to and from a bed to a chair (including a wheelchair)?: A Little Help needed standing up from a chair using your arms (e.g., wheelchair or bedside chair)?: A Little Help needed to walk in hospital room?: A Little Help needed climbing 3-5 steps with a railing? : A Lot 6 Click Score: 18    End of Session Equipment Utilized During Treatment: Gait belt Activity Tolerance: Patient tolerated treatment well Patient left: in chair;with call bell/phone within reach Nurse Communication: Mobility status;Patient requests pain meds (nurse provided pt with pain medication at begning of eval) PT Visit Diagnosis: Unsteadiness on feet (R26.81);Other abnormalities of gait and mobility (R26.89);Muscle weakness (generalized) (M62.81);Difficulty in walking, not elsewhere classified (R26.2);Pain Pain - Right/Left: Right Pain - part of body: Knee;Leg    Time: 8171-8146 PT Time Calculation (min) (ACUTE ONLY): 25 min   Charges:   PT Evaluation $PT Eval Low Complexity: 1 Low PT Treatments $Gait Training: 8-22 mins PT General Charges $$  ACUTE PT VISIT: 1 Visit         [place], PT Acute Rehab   [place] VEAR Drone 07/29/2024, 7:02 PM

## 2024-07-30 ENCOUNTER — Other Ambulatory Visit (HOSPITAL_COMMUNITY): Payer: Self-pay

## 2024-07-30 ENCOUNTER — Encounter (HOSPITAL_COMMUNITY): Payer: Self-pay | Admitting: Orthopedic Surgery

## 2024-07-30 DIAGNOSIS — Z79899 Other long term (current) drug therapy: Secondary | ICD-10-CM | POA: Diagnosis not present

## 2024-07-30 DIAGNOSIS — I129 Hypertensive chronic kidney disease with stage 1 through stage 4 chronic kidney disease, or unspecified chronic kidney disease: Secondary | ICD-10-CM | POA: Diagnosis not present

## 2024-07-30 DIAGNOSIS — Z96641 Presence of right artificial hip joint: Secondary | ICD-10-CM | POA: Diagnosis not present

## 2024-07-30 DIAGNOSIS — M1711 Unilateral primary osteoarthritis, right knee: Secondary | ICD-10-CM | POA: Diagnosis not present

## 2024-07-30 DIAGNOSIS — M25561 Pain in right knee: Secondary | ICD-10-CM | POA: Diagnosis not present

## 2024-07-30 DIAGNOSIS — Z87891 Personal history of nicotine dependence: Secondary | ICD-10-CM | POA: Diagnosis not present

## 2024-07-30 DIAGNOSIS — Z85828 Personal history of other malignant neoplasm of skin: Secondary | ICD-10-CM | POA: Diagnosis not present

## 2024-07-30 DIAGNOSIS — N189 Chronic kidney disease, unspecified: Secondary | ICD-10-CM | POA: Diagnosis not present

## 2024-07-30 LAB — CBC
HCT: 42.8 % (ref 39.0–52.0)
Hemoglobin: 13.5 g/dL (ref 13.0–17.0)
MCH: 27.3 pg (ref 26.0–34.0)
MCHC: 31.5 g/dL (ref 30.0–36.0)
MCV: 86.6 fL (ref 80.0–100.0)
Platelets: 266 K/uL (ref 150–400)
RBC: 4.94 MIL/uL (ref 4.22–5.81)
RDW: 13.2 % (ref 11.5–15.5)
WBC: 11.3 K/uL — ABNORMAL HIGH (ref 4.0–10.5)
nRBC: 0 % (ref 0.0–0.2)

## 2024-07-30 LAB — BASIC METABOLIC PANEL WITH GFR
Anion gap: 11 (ref 5–15)
BUN: 30 mg/dL — ABNORMAL HIGH (ref 8–23)
CO2: 25 mmol/L (ref 22–32)
Calcium: 9.7 mg/dL (ref 8.9–10.3)
Chloride: 102 mmol/L (ref 98–111)
Creatinine, Ser: 1.3 mg/dL — ABNORMAL HIGH (ref 0.61–1.24)
GFR, Estimated: 54 mL/min — ABNORMAL LOW (ref >60)
Glucose, Bld: 168 mg/dL — ABNORMAL HIGH (ref 70–99)
Potassium: 4.3 mmol/L (ref 3.5–5.1)
Sodium: 137 mmol/L (ref 135–145)

## 2024-07-30 MED ORDER — METHOCARBAMOL 500 MG PO TABS
500.0000 mg | ORAL_TABLET | Freq: Four times a day (QID) | ORAL | 0 refills | Status: AC | PRN
  Filled 2024-07-30: qty 40, 10d supply, fill #0

## 2024-07-30 MED ORDER — OXYCODONE HCL 5 MG PO TABS
5.0000 mg | ORAL_TABLET | Freq: Four times a day (QID) | ORAL | 0 refills | Status: AC | PRN
  Filled 2024-07-30: qty 42, 6d supply, fill #0

## 2024-07-30 MED ORDER — CHLORHEXIDINE GLUCONATE 4 % EX SOLN
1.0000 | CUTANEOUS | 1 refills | Status: AC
  Filled 2024-07-30: qty 118, 14d supply, fill #0

## 2024-07-30 MED ORDER — RIVAROXABAN 10 MG PO TABS
10.0000 mg | ORAL_TABLET | Freq: Every day | ORAL | 0 refills | Status: AC
  Filled 2024-07-30: qty 20, 20d supply, fill #0

## 2024-07-30 MED ORDER — ONDANSETRON HCL 4 MG PO TABS
4.0000 mg | ORAL_TABLET | Freq: Four times a day (QID) | ORAL | 0 refills | Status: AC | PRN
  Filled 2024-07-30: qty 20, 5d supply, fill #0

## 2024-07-30 MED ORDER — TRAMADOL HCL 50 MG PO TABS
50.0000 mg | ORAL_TABLET | Freq: Four times a day (QID) | ORAL | 0 refills | Status: AC | PRN
  Filled 2024-07-30: qty 40, 5d supply, fill #0

## 2024-07-30 MED ORDER — MUPIROCIN 2 % EX OINT
1.0000 | TOPICAL_OINTMENT | Freq: Two times a day (BID) | CUTANEOUS | 0 refills | Status: AC
  Filled 2024-07-30: qty 22, 30d supply, fill #0

## 2024-07-30 NOTE — Progress Notes (Signed)
 Discharge instructions given to patient questions asked and answered, discharge medications delivered to patient at the bedside in a secure bag

## 2024-07-30 NOTE — Progress Notes (Signed)
   Subjective: 1 Day Post-Op Procedure(s) (LRB): ARTHROPLASTY, KNEE, TOTAL (Right) Patient reports pain as mild.   Patient seen in rounds by Dr. Melodi. Patient is well, and has had no acute complaints or problems No issues overnight. Denies chest pain, SOB, or calf pain. Foley catheter removed this AM.  We will continue therapy today, ambulated 31' yesterday.   Objective: Vital signs in last 24 hours: Temp:  [97.6 F (36.4 C)-98.3 F (36.8 C)] 97.6 F (36.4 C) (10/28 0610) Pulse Rate:  [67-106] 75 (10/28 0610) Resp:  [10-22] 16 (10/27 1712) BP: (98-174)/(63-120) 174/86 (10/28 0832) SpO2:  [95 %-100 %] 100 % (10/28 0610) Weight:  [79 kg] 79 kg (10/27 1028)  Intake/Output from previous day:  Intake/Output Summary (Last 24 hours) at 07/30/2024 0842 Last data filed at 07/30/2024 0637 Gross per 24 hour  Intake 3245 ml  Output 1995 ml  Net 1250 ml     Intake/Output this shift: No intake/output data recorded.  Labs: Recent Labs    07/30/24 0333  HGB 13.5   Recent Labs    07/30/24 0333  WBC 11.3*  RBC 4.94  HCT 42.8  PLT 266   Recent Labs    07/30/24 0333  NA 137  K 4.3  CL 102  CO2 25  BUN 30*  CREATININE 1.30*  GLUCOSE 168*  CALCIUM 9.7   No results for input(s): LABPT, INR in the last 72 hours.  Exam: General - Patient is Alert and Oriented Extremity - Neurologically intact Neurovascular intact Sensation intact distally Dorsiflexion/Plantar flexion intact Dressing - dressing C/D/I Motor Function - intact, moving foot and toes well on exam.   Past Medical History:  Diagnosis Date   Arthritis    Cancer (HCC)    basal cell to forehead   Chest pain 10/02/2015   Chronic kidney disease    hx kidney stones   GERD (gastroesophageal reflux disease)    H/O hiatal hernia    History of kidney stones    Hypertension    Pneumonia    Wears glasses     Assessment/Plan: 1 Day Post-Op Procedure(s) (LRB): ARTHROPLASTY, KNEE, TOTAL  (Right) Principal Problem:   OA (osteoarthritis) of knee Active Problems:   Primary osteoarthritis of right knee  Estimated body mass index is 26.48 kg/m as calculated from the following:   Height as of this encounter: 5' 8 (1.727 m).   Weight as of this encounter: 79 kg. Advance diet Up with therapy D/C IV fluids   Patient's anticipated LOS is less than 2 midnights, meeting these requirements: - Lives within 1 hour of care - Has a competent adult at home to recover with post-op recover - NO history of  - Chronic pain requiring opiods  - Diabetes  - Coronary Artery Disease  - Heart failure  - Heart attack  - Stroke  - Cardiac arrhythmia  - Respiratory Failure/COPD  - Renal failure  - Anemia  - Advanced Liver disease   DVT Prophylaxis - Xarelto  Weight bearing as tolerated. Continue therapy.  BP elevated this morning, antihypertensive on board.  Plan is to go Home after hospital stay. Plan for discharge later today if progresses with therapy and meeting goals. [facility]uled [facility]. Follow-up in the office in 2 weeks.  The PDMP database was reviewed today prior to any opioid medications being prescribed to this patient.  Roxie Mess, PA-C Orthopedic Surgery [phone] 07/30/2024, 8:42 AM

## 2024-07-30 NOTE — Progress Notes (Signed)
 Physical Therapy Treatment Patient Details Name: Frank Johnston MRN: [id number] DOB: 23-Aug-1941 Today's Date: 07/30/2024   History of Present Illness 83 yo male presents to therapy s/p R TKA on 07/29/2024 due to failure of conservative measures. Pt PMH includes but is not limited to: DVT, PE, HTN, ileus, arthritis, CKD, GERD, hiatal hernia, back surgery, L TKA (2012) and revision and R THA, anterior approach on 12/06/2023.    PT Comments   Frank Johnston is a 83 y.o. male POD 1 s/p R TKA. Patient reports mod I with mobility at baseline. Patient is now limited by functional impairments (see PT problem list below) and requires S for transfers and gait with RW. Patient was able to ambulate 150 feet x 2  with RW and S and cues for safe walker management. Patient educated on safe sequencing for stair mobility, fall risk prevention, pain management, use of CP/ice pt verbalized understanding.. Patient instructed in exercises to facilitate ROM and circulation reviewed and HO provided. Patient will benefit from continued skilled PT interventions to address impairments and progress towards PLOF. Patient has met mobility goals at adequate level for discharge home with family support and OPPT services scheduled for 10/30; will continue to follow if pt continues acute stay to progress towards Mod I goals.    If plan is discharge home, recommend the following: A little help with walking and/or transfers;A little help with bathing/dressing/bathroom;Assistance with cooking/housework;Assist for transportation;Help with stairs or ramp for entrance   Can travel by private vehicle        Equipment Recommendations  None recommended by PT    Recommendations for Other Services       Precautions / Restrictions Precautions Precautions: Knee;Fall Restrictions Weight Bearing Restrictions Per Provider Order: Yes RLE Weight Bearing Per Provider Order: Weight bearing as tolerated     Mobility  Bed  Mobility Overal bed mobility: Modified Independent Bed Mobility: Supine to Sit, Sit to Supine                Transfers Overall transfer level: Needs assistance Equipment used: Rolling walker (2 wheels) Transfers: Sit to/from Stand Sit to Stand: Supervision           General transfer comment: min cues for proper UE placement    Ambulation/Gait Ambulation/Gait assistance: Supervision Gait Distance (Feet): 150 Feet Assistive device: Rolling walker (2 wheels) Gait Pattern/deviations: Step-to pattern, Decreased stance time - right, Antalgic, Trunk flexed Gait velocity: decreased     General Gait Details: slight trunk flexion with B UE support at RW to offload R LE in stance phase min cues with pt demonstrating increased stride length   Stairs Stairs: Yes Stairs assistance: Supervision Stair Management: Two rails Number of Stairs: 3 General stair comments: min cues, pt able to self-cue for proper sequencing and technique   Wheelchair Mobility     Tilt Bed    Modified Rankin (Stroke Patients Only)       Balance Overall balance assessment: Mild deficits observed, not formally tested, Needs assistance Sitting-balance support: Feet supported Sitting balance-Leahy Scale: Good     Standing balance support: Bilateral upper extremity supported, During functional activity, Reliant on assistive device for balance Standing balance-Leahy Scale: Fair Standing balance comment: static standing no UE support                            Communication Communication Communication: No apparent difficulties  Cognition Arousal: Alert Behavior During Therapy: WFL for tasks  assessed/performed   PT - Cognitive impairments: No apparent impairments                         Following commands: Intact      Cueing    Exercises Total Joint Exercises Ankle Circles/Pumps: AROM, Both, 10 reps Quad Sets: AROM, Right, 5 reps Short Arc Quad: AROM, Right, 5  reps Heel Slides: AROM, Right, 5 reps Hip ABduction/ADduction: AROM, Right, 5 reps Straight Leg Raises: AROM, Right, 5 reps Knee Flexion: AROM, Right, 5 reps, Seated Goniometric ROM: AROM in supine grossly 0-75 degrees    General Comments        Pertinent Vitals/Pain Pain Assessment Pain Assessment: 0-10 Pain Score: 3  Pain Location: R knee and LE Pain Descriptors / Indicators: Aching, Discomfort, Grimacing, Operative site guarding, Spasm Pain Intervention(s): Monitored during session, Limited activity within patient's tolerance, Premedicated before session, Repositioned, Ice applied, Patient requesting pain meds-RN notified, RN gave pain meds during session    Home Living                          Prior Function            PT Goals (current goals can now be found in the care plan section) Acute Rehab PT Goals Patient Stated Goal: to be able to keep up with the [facility] PT Goal Formulation: With patient Time For Goal Achievement: 08/12/24 Potential to Achieve Goals: Good Progress towards PT goals: Progressing toward goals    Frequency    7X/week      PT Plan      Co-evaluation              AM-PAC PT 6 Clicks Mobility   Outcome Measure  Help needed turning from your back to your side while in a flat bed without using bedrails?: None Help needed moving from lying on your back to sitting on the side of a flat bed without using bedrails?: None Help needed moving to and from a bed to a chair (including a wheelchair)?: None Help needed standing up from a chair using your arms (e.g., wheelchair or bedside chair)?: A Little Help needed to walk in hospital room?: A Little Help needed climbing 3-5 steps with a railing? : A Little 6 Click Score: 21    End of Session Equipment Utilized During Treatment: Gait belt Activity Tolerance: Patient tolerated treatment well Patient left: with call bell/phone within reach;in bed Nurse Communication:  Mobility status;Other (comment) (pt readiness for d/c home from PT standpoint) PT Visit Diagnosis: Unsteadiness on feet (R26.81);Other abnormalities of gait and mobility (R26.89);Muscle weakness (generalized) (M62.81);Difficulty in walking, not elsewhere classified (R26.2);Pain Pain - Right/Left: Right Pain - part of body: Knee;Leg     Time: 8962-8895 PT Time Calculation (min) (ACUTE ONLY): 27 min  Charges:    $Gait Training: 8-22 mins $Therapeutic Exercise: 8-22 mins PT General Charges $$ ACUTE PT VISIT: 1 Visit                     [place], PT Acute Rehab    [place] VEAR Drone 07/30/2024, 11:25 AM

## 2024-07-30 NOTE — Care Management Obs Status (Signed)
 MEDICARE OBSERVATION STATUS NOTIFICATION   Patient Details  Name: AMIEL MCCAFFREY MRN: [id number] Date of Birth: 1941/03/02   Medicare Observation Status Notification Given:  Yes    Alfonse JONELLE Rex, RN 07/30/2024, 9:58 AM

## 2024-07-30 NOTE — TOC Transition Note (Signed)
 Transition of Care [facility]) - Discharge Note   Patient Details  Name: MANSFIELD DANN MRN: [id number] Date of Birth: [birth year]  [facility] CM/SW Contact:  Alfonse JONELLE Rex, RN Phone Number: 07/30/2024, 1:28 PM   Clinical Narrative:   Met with patient at bedside to review dc therapy and home equipment needs, pt confirmed OPPT-EO, has RW. MOON completed.     Final next level of care: OP Rehab Barriers to Discharge: No Barriers Identified   Patient Goals and CMS Choice Patient states their goals for this hospitalization and ongoing recovery are:: return home          Discharge Placement                       Discharge Plan and Services Additional resources added to the After Visit Summary for                                       Social Drivers of Health (SDOH) Interventions SDOH Screenings   Food Insecurity: No Food Insecurity (07/29/2024)  Housing: Low Risk  (07/29/2024)  Transportation Needs: No Transportation Needs (07/29/2024)  Utilities: Not At Risk (07/29/2024)  Social Connections: Socially Integrated (07/29/2024)  Tobacco Use: Medium Risk (07/29/2024)     Readmission Risk Interventions     No data to display

## 2024-08-01 DIAGNOSIS — M25561 Pain in right knee: Secondary | ICD-10-CM | POA: Diagnosis not present

## 2024-08-02 NOTE — Progress Notes (Signed)
  Subjective:  Patient ID: Frank Johnston, male    DOB: 06-May-1941,  MRN: [id number]  83 y.o. male presents to clinic with  preventative diabetic foot care for painful mycotic toenails x 10 which interfere with daily activities. Pain is relieved with periodic professional debridement.  Chief Complaint  Patient presents with   Toe Pain    Dr. Yolande is his PCP. Last visit was in early 25. He denies being  a diabetic   New problem(s): None   PCP is Yolande Toribio MATSU, MD.  Allergies  Allergen Reactions   Amlodipine  Swelling   Lisinopril-Hydrochlorothiazide  Cough    Review of Systems: Negative except as noted in the HPI.   Objective:  Frank Johnston is a pleasant 83 y.o. male WD, WN in NAD. AAO x 3.  Vascular Examination: Vascular status intact b/l with palpable pedal pulses. Pedal hair sparse. CFT immediate b/l. No edema. No pain with calf compression b/l. Skin temperature gradient WNL b/l. No edema noted b/l LE.  Neurological Examination: Sensation grossly intact b/l with 10 gram monofilament. Vibratory sensation intact b/l.   Dermatological Examination: Pedal skin with normal turgor, texture and tone b/l. Toenails 1-5 b/l thick, discolored, elongated with subungual debris and pain on dorsal palpation. No hyperkeratotic lesions noted b/l.   Musculoskeletal Examination: Muscle strength 5/5 to all lower extremity muscle groups bilaterally. No pain, crepitus or joint limitation noted with ROM bilateral LE. No gross bony deformities bilaterally.  Radiographs: None Assessment:   1. Pain due to onychomycosis of toenails of both feet    Plan:  Patient was evaluated and treated. All patient's and/or POA's questions/concerns addressed on today's visit. Toenails 1-5 b/l debrided in length and girth without incident. Continue soft, supportive shoe gear daily. Report any pedal injuries to medical professional. Call office if there are any questions/concerns. -Patient/POA to call  should there be question/concern in the interim.  Return in about 3 months (around 10/25/2024).  Delon LITTIE Merlin, DPM       LOCATION: [address], [place] [address]                   Office [phone]   [facility] LOCATION: [address] [place], [place] [address] Office [phone]

## 2024-08-05 DIAGNOSIS — M25561 Pain in right knee: Secondary | ICD-10-CM | POA: Diagnosis not present

## 2024-08-05 NOTE — Discharge Summary (Signed)
 Patient ID: SHAWON DENZER MRN: [id number] DOB/AGE: 01/05/1941 83 y.o.  Admit date: 07/29/2024 Discharge date: 07/30/2024  Admission Diagnoses:  Principal Problem:   OA (osteoarthritis) of knee Active Problems:   Primary osteoarthritis of right knee   Discharge Diagnoses:  Same  Past Medical History:  Diagnosis Date   Arthritis    Cancer (HCC)    basal cell to forehead   Chest pain 10/02/2015   Chronic kidney disease    hx kidney stones   GERD (gastroesophageal reflux disease)    H/O hiatal hernia    History of kidney stones    Hypertension    Pneumonia    Wears glasses     Surgeries: Procedure(s): ARTHROPLASTY, KNEE, TOTAL on 07/29/2024   Consultants:   Discharged Condition: Improved  Hospital Course: CRIST KRUSZKA is an 83 y.o. male who was admitted 07/29/2024 for operative treatment ofOA (osteoarthritis) of knee. Patient has severe unremitting pain that affects sleep, daily activities, and work/hobbies. After pre-op clearance the patient was taken to the operating room on 07/29/2024 and underwent  Procedure(s): ARTHROPLASTY, KNEE, TOTAL.    Patient was given perioperative antibiotics:  Anti-infectives (From admission, onward)    Start     Dose/Rate Route Frequency Ordered Stop   07/29/24 1830  ceFAZolin  (ANCEF ) IVPB 2g/100 mL premix        2 g 200 mL/hr over 30 Minutes Intravenous Every 6 hours 07/29/24 1714 07/30/24 1104   07/29/24 1030  ceFAZolin  (ANCEF ) IVPB 2g/100 mL premix        2 g 200 mL/hr over 30 Minutes Intravenous On call to O.R. 07/29/24 1015 07/30/24 1104        Patient was given sequential compression devices, early ambulation, and chemoprophylaxis to prevent DVT.  Patient benefited maximally from hospital stay and there were no complications.    Recent vital signs: No data found.   Recent laboratory studies: No results for input(s): WBC, HGB, HCT, PLT, NA, K, CL, CO2, BUN, CREATININE, GLUCOSE, INR,  CALCIUM in the last 72 hours.  Invalid input(s): PT, 2   Discharge Medications:   Allergies as of 07/30/2024       Reactions   Amlodipine  Swelling   Lisinopril-hydrochlorothiazide  Cough        Medication List     STOP taking these medications    aspirin  EC 81 MG tablet   HYDROcodone -acetaminophen  5-325 MG tablet Commonly known as: NORCO/VICODIN   multivitamin with minerals tablet       TAKE these medications    acetaminophen  500 MG tablet Commonly known as: TYLENOL  Take 500-1,000 mg by mouth every 8 (eight) hours as needed for moderate pain (pain score 4-6) or headache.   Dyna-Hex 4 4 % external liquid Generic drug: chlorhexidine  Apply 15 mLs (1 Application total) topically as directed for 30 doses. Use as directed daily for 5 days every other week for 6 weeks.   fluticasone  50 MCG/ACT nasal spray Commonly known as: FLONASE  Place 2 sprays into the nose at bedtime.   hydrALAZINE  50 MG tablet Commonly known as: APRESOLINE  Take 1 tablet (50 mg total) by mouth 2 (two) times daily. May also take 1 tablet (50 mg total) daily as needed (for systolic blood pressure above 849).   methocarbamol  500 MG tablet Commonly known as: ROBAXIN  Take 1 tablet (500 mg total) by mouth every 6 (six) hours as needed for muscle spasms.   metoCLOPramide  5 MG tablet Commonly known as: REGLAN  Take 5 mg by mouth at bedtime.  mupirocin  ointment 2 % Commonly known as: BACTROBAN  Place 1 Application into the nose 2 (two) times daily for 60 doses. Use as directed 2 times daily for 5 days every other week for 6 weeks.   omeprazole 40 MG capsule Commonly known as: PRILOSEC Take 40 mg by mouth every evening.   ondansetron  4 MG tablet Commonly known as: ZOFRAN  Take 1 tablet (4 mg total) by mouth every 6 (six) hours as needed for nausea.   oxyCODONE  5 MG immediate release tablet Commonly known as: Oxy IR/ROXICODONE  Take 1-2 tablets (5-10 mg total) by mouth every 6 (six) hours as  needed for severe pain (pain score 7-10).   PROBIOTIC ADVANCED PO Take 1 capsule by mouth at bedtime.   Senna-S 8.6-50 MG tablet Generic drug: senna-docusate Take 1 tablet by mouth daily as needed for mild constipation or moderate constipation.   traMADol  50 MG tablet Commonly known as: ULTRAM  Take 1-2 tablets (50-100 mg total) by mouth every 6 (six) hours as needed for moderate pain (pain score 4-6).   valsartan -hydrochlorothiazide  320-12.5 MG tablet Commonly known as: DIOVAN -HCT Take 1 tablet by mouth daily.   Xarelto  10 MG Tabs tablet Generic drug: rivaroxaban  Take 1 tablet (10 mg total) by mouth daily with breakfast for 20 days. Then resume one 81 mg aspirin  once a day               Discharge Care Instructions  (From admission, onward)           Start     Ordered   07/30/24 0000  Weight bearing as tolerated        07/30/24 0847   07/30/24 0000  Change dressing       Comments: You may remove the bulky bandage (ACE wrap and gauze) two days after surgery. You will have an adhesive waterproof bandage underneath. Leave this in place until your first follow-up appointment.   07/30/24 0847            Diagnostic Studies: No results found.  Disposition: Discharge disposition: 01-Home or Self Care       Discharge Instructions     Call MD / Call 911   Complete by: As directed    If you experience chest pain or shortness of breath, CALL 911 and be transported to the hospital emergency room.  If you develope a fever above 101 F, pus (white drainage) or increased drainage or redness at the wound, or calf pain, call your surgeon's office.   Change dressing   Complete by: As directed    You may remove the bulky bandage (ACE wrap and gauze) two days after surgery. You will have an adhesive waterproof bandage underneath. Leave this in place until your first follow-up appointment.   Constipation Prevention   Complete by: As directed    Drink plenty of fluids.   Prune juice may be helpful.  You may use a stool softener, such as Colace (over the counter) 100 mg twice a day.  Use MiraLax  (over the counter) for constipation as needed.   Diet - low sodium heart healthy   Complete by: As directed    Do not put a pillow under the knee. Place it under the heel.   Complete by: As directed    Driving restrictions   Complete by: As directed    No driving for two weeks   Post-operative opioid taper instructions:   Complete by: As directed    POST-OPERATIVE OPIOID TAPER INSTRUCTIONS: It is important  to wean off of your opioid medication as soon as possible. If you do not need pain medication after your surgery it is ok to stop day one. Opioids include: Codeine, Hydrocodone (Norco, Vicodin), Oxycodone (Percocet, oxycontin ) and hydromorphone  amongst others.  Long term and even short term use of opiods can cause: Increased pain response Dependence Constipation Depression Respiratory depression And more.  Withdrawal symptoms can include Flu like symptoms Nausea, vomiting And more Techniques to manage these symptoms Hydrate well Eat regular healthy meals Stay active Use relaxation techniques(deep breathing, meditating, yoga) Do Not substitute Alcohol  to help with tapering If you have been on opioids for less than two weeks and do not have pain than it is ok to stop all together.  Plan to wean off of opioids This plan should start within one week post op of your joint replacement. Maintain the same interval or time between taking each dose and first decrease the dose.  Cut the total daily intake of opioids by one tablet each day Next start to increase the time between doses. The last dose that should be eliminated is the evening dose.      TED hose   Complete by: As directed    Use stockings (TED hose) for three weeks on both leg(s).  You may remove them at night for sleeping.   Weight bearing as tolerated   Complete by: As directed          Follow-up Information     Aluisio, Dempsey, MD Follow up in 2 week(s).   Specialty: Orthopedic Surgery Contact information: [address] [place] [address] [place] [address] [phone]                  Signed: Roxie Mess 08/05/2024, 10:29 AM

## 2024-08-07 DIAGNOSIS — M25561 Pain in right knee: Secondary | ICD-10-CM | POA: Diagnosis not present

## 2024-08-12 DIAGNOSIS — M25561 Pain in right knee: Secondary | ICD-10-CM | POA: Diagnosis not present

## 2024-08-14 DIAGNOSIS — M25561 Pain in right knee: Secondary | ICD-10-CM | POA: Diagnosis not present

## 2024-08-16 DIAGNOSIS — M25561 Pain in right knee: Secondary | ICD-10-CM | POA: Diagnosis not present

## 2024-08-19 DIAGNOSIS — M25561 Pain in right knee: Secondary | ICD-10-CM | POA: Diagnosis not present

## 2024-08-23 DIAGNOSIS — M25561 Pain in right knee: Secondary | ICD-10-CM | POA: Diagnosis not present

## 2024-08-27 DIAGNOSIS — M25561 Pain in right knee: Secondary | ICD-10-CM | POA: Diagnosis not present

## 2024-09-02 DIAGNOSIS — M25561 Pain in right knee: Secondary | ICD-10-CM | POA: Diagnosis not present

## 2024-09-05 DIAGNOSIS — M25561 Pain in right knee: Secondary | ICD-10-CM | POA: Diagnosis not present

## 2024-09-05 DIAGNOSIS — Z5189 Encounter for other specified aftercare: Secondary | ICD-10-CM | POA: Diagnosis not present

## 2024-09-18 DIAGNOSIS — M25561 Pain in right knee: Secondary | ICD-10-CM | POA: Diagnosis not present

## 2024-11-14 ENCOUNTER — Ambulatory Visit: Admitting: Podiatry
